# Patient Record
Sex: Female | Born: 1935 | Race: White | Hispanic: No | Marital: Married | Smoking: Never smoker
Health system: Southern US, Community
[De-identification: ages and names within clinical notes are randomized; demographics above are authoritative.]

## PROBLEM LIST (undated history)

## (undated) DIAGNOSIS — A4902 Methicillin resistant Staphylococcus aureus infection, unspecified site: Secondary | ICD-10-CM

## (undated) DIAGNOSIS — I10 Essential (primary) hypertension: Secondary | ICD-10-CM

## (undated) DIAGNOSIS — I2510 Atherosclerotic heart disease of native coronary artery without angina pectoris: Secondary | ICD-10-CM

## (undated) DIAGNOSIS — E785 Hyperlipidemia, unspecified: Secondary | ICD-10-CM

## (undated) DIAGNOSIS — I219 Acute myocardial infarction, unspecified: Secondary | ICD-10-CM

## (undated) HISTORY — DX: Atherosclerotic heart disease of native coronary artery without angina pectoris: I25.10

## (undated) HISTORY — DX: Hyperlipidemia, unspecified: E78.5

## (undated) HISTORY — DX: Essential (primary) hypertension: I10

## (undated) HISTORY — PX: CORONARY ANGIOPLASTY WITH STENT PLACEMENT: SHX49

## (undated) HISTORY — DX: Methicillin resistant Staphylococcus aureus infection, unspecified site: A49.02

## (undated) HISTORY — PX: CATARACT EXTRACTION, BILATERAL: SHX1313

---

## 1999-07-23 ENCOUNTER — Encounter (INDEPENDENT_AMBULATORY_CARE_PROVIDER_SITE_OTHER): Payer: Self-pay | Admitting: Specialist

## 1999-07-23 ENCOUNTER — Other Ambulatory Visit: Admission: RE | Admit: 1999-07-23 | Discharge: 1999-07-23 | Payer: Self-pay | Admitting: Obstetrics and Gynecology

## 2000-09-18 ENCOUNTER — Encounter: Payer: Self-pay | Admitting: Internal Medicine

## 2000-09-18 ENCOUNTER — Encounter: Admission: RE | Admit: 2000-09-18 | Discharge: 2000-09-18 | Payer: Self-pay | Admitting: Internal Medicine

## 2001-09-17 ENCOUNTER — Other Ambulatory Visit: Admission: RE | Admit: 2001-09-17 | Discharge: 2001-09-17 | Payer: Self-pay | Admitting: Internal Medicine

## 2001-09-21 ENCOUNTER — Encounter: Admission: RE | Admit: 2001-09-21 | Discharge: 2001-09-21 | Payer: Self-pay | Admitting: Internal Medicine

## 2001-09-21 ENCOUNTER — Encounter: Payer: Self-pay | Admitting: Internal Medicine

## 2002-10-15 ENCOUNTER — Encounter: Admission: RE | Admit: 2002-10-15 | Discharge: 2002-10-15 | Payer: Self-pay | Admitting: Internal Medicine

## 2002-10-15 ENCOUNTER — Encounter: Payer: Self-pay | Admitting: Internal Medicine

## 2002-12-24 ENCOUNTER — Ambulatory Visit: Admission: RE | Admit: 2002-12-24 | Discharge: 2002-12-24 | Payer: Self-pay | Admitting: Gastroenterology

## 2003-10-10 ENCOUNTER — Other Ambulatory Visit: Admission: RE | Admit: 2003-10-10 | Discharge: 2003-10-10 | Payer: Self-pay | Admitting: Internal Medicine

## 2003-10-19 ENCOUNTER — Ambulatory Visit (HOSPITAL_COMMUNITY): Admission: RE | Admit: 2003-10-19 | Discharge: 2003-10-19 | Payer: Self-pay | Admitting: Internal Medicine

## 2003-11-08 ENCOUNTER — Ambulatory Visit (HOSPITAL_COMMUNITY): Admission: RE | Admit: 2003-11-08 | Discharge: 2003-11-08 | Payer: Self-pay | Admitting: Internal Medicine

## 2004-11-16 ENCOUNTER — Ambulatory Visit (HOSPITAL_COMMUNITY): Admission: RE | Admit: 2004-11-16 | Discharge: 2004-11-16 | Payer: Self-pay | Admitting: Internal Medicine

## 2006-01-06 ENCOUNTER — Ambulatory Visit (HOSPITAL_COMMUNITY): Admission: RE | Admit: 2006-01-06 | Discharge: 2006-01-06 | Payer: Self-pay | Admitting: Internal Medicine

## 2007-01-15 ENCOUNTER — Ambulatory Visit (HOSPITAL_COMMUNITY): Admission: RE | Admit: 2007-01-15 | Discharge: 2007-01-15 | Payer: Self-pay | Admitting: Internal Medicine

## 2008-02-04 ENCOUNTER — Ambulatory Visit (HOSPITAL_COMMUNITY): Admission: RE | Admit: 2008-02-04 | Discharge: 2008-02-04 | Payer: Self-pay | Admitting: Internal Medicine

## 2008-07-19 ENCOUNTER — Emergency Department (HOSPITAL_COMMUNITY): Admission: EM | Admit: 2008-07-19 | Discharge: 2008-07-19 | Payer: Self-pay | Admitting: Emergency Medicine

## 2009-03-09 ENCOUNTER — Encounter: Payer: Self-pay | Admitting: Cardiovascular Disease

## 2009-03-12 ENCOUNTER — Encounter: Payer: Self-pay | Admitting: Emergency Medicine

## 2009-03-12 ENCOUNTER — Inpatient Hospital Stay (HOSPITAL_COMMUNITY): Admission: EM | Admit: 2009-03-12 | Discharge: 2009-03-18 | Payer: Self-pay | Admitting: Cardiovascular Disease

## 2009-03-12 ENCOUNTER — Ambulatory Visit: Payer: Self-pay | Admitting: Cardiovascular Disease

## 2009-03-12 DIAGNOSIS — I2510 Atherosclerotic heart disease of native coronary artery without angina pectoris: Secondary | ICD-10-CM | POA: Insufficient documentation

## 2009-03-13 ENCOUNTER — Encounter: Payer: Self-pay | Admitting: Cardiovascular Disease

## 2009-03-15 ENCOUNTER — Ambulatory Visit: Payer: Self-pay | Admitting: Infectious Diseases

## 2009-04-03 DIAGNOSIS — E785 Hyperlipidemia, unspecified: Secondary | ICD-10-CM | POA: Insufficient documentation

## 2009-04-03 DIAGNOSIS — A4902 Methicillin resistant Staphylococcus aureus infection, unspecified site: Secondary | ICD-10-CM | POA: Insufficient documentation

## 2009-04-03 DIAGNOSIS — I10 Essential (primary) hypertension: Secondary | ICD-10-CM | POA: Insufficient documentation

## 2009-04-04 ENCOUNTER — Ambulatory Visit: Payer: Self-pay | Admitting: Cardiovascular Disease

## 2009-05-05 ENCOUNTER — Encounter: Payer: Self-pay | Admitting: Cardiovascular Disease

## 2009-05-05 ENCOUNTER — Ambulatory Visit: Payer: Self-pay | Admitting: Cardiovascular Disease

## 2009-05-05 ENCOUNTER — Ambulatory Visit: Payer: Self-pay

## 2009-05-05 DIAGNOSIS — I2119 ST elevation (STEMI) myocardial infarction involving other coronary artery of inferior wall: Secondary | ICD-10-CM | POA: Insufficient documentation

## 2009-05-10 LAB — CONVERTED CEMR LAB
ALT: 26 units/L (ref 0–35)
AST: 31 units/L (ref 0–37)
Albumin: 4 g/dL (ref 3.5–5.2)
BUN: 25 mg/dL — ABNORMAL HIGH (ref 6–23)
Chloride: 113 meq/L — ABNORMAL HIGH (ref 96–112)
Creatinine, Ser: 1.2 mg/dL (ref 0.4–1.2)
Glucose, Bld: 88 mg/dL (ref 70–99)
LDL Cholesterol: 65 mg/dL (ref 0–99)
Total Bilirubin: 0.9 mg/dL (ref 0.3–1.2)
Total Protein: 6.7 g/dL (ref 6.0–8.3)
Triglycerides: 65 mg/dL (ref 0.0–149.0)

## 2009-05-15 ENCOUNTER — Ambulatory Visit: Payer: Self-pay | Admitting: Cardiovascular Disease

## 2009-08-07 ENCOUNTER — Telehealth: Payer: Self-pay | Admitting: Cardiovascular Disease

## 2009-08-11 ENCOUNTER — Ambulatory Visit: Payer: Self-pay | Admitting: Cardiovascular Disease

## 2009-08-11 DIAGNOSIS — R42 Dizziness and giddiness: Secondary | ICD-10-CM | POA: Insufficient documentation

## 2009-09-22 ENCOUNTER — Ambulatory Visit: Payer: Self-pay | Admitting: Cardiovascular Disease

## 2009-09-28 ENCOUNTER — Encounter (INDEPENDENT_AMBULATORY_CARE_PROVIDER_SITE_OTHER): Payer: Self-pay | Admitting: *Deleted

## 2009-10-01 LAB — CONVERTED CEMR LAB
ALT: 29 U/L
AST: 39 U/L — ABNORMAL HIGH
Albumin: 4.3 g/dL
Alkaline Phosphatase: 68 U/L
Bilirubin, Direct: 0 mg/dL
Cholesterol: 145 mg/dL
HDL: 52.6 mg/dL
LDL Cholesterol: 73 mg/dL
Total Bilirubin: 1.1 mg/dL
Total CHOL/HDL Ratio: 3
Total Protein: 7.4 g/dL
Triglycerides: 97 mg/dL
VLDL: 19.4 mg/dL

## 2009-10-02 ENCOUNTER — Ambulatory Visit: Payer: Self-pay | Admitting: Cardiovascular Disease

## 2010-02-13 ENCOUNTER — Encounter: Payer: Self-pay | Admitting: Cardiovascular Disease

## 2010-04-18 ENCOUNTER — Ambulatory Visit: Payer: Self-pay | Admitting: Cardiovascular Disease

## 2010-10-23 ENCOUNTER — Ambulatory Visit: Payer: Self-pay | Admitting: Cardiovascular Disease

## 2011-01-08 NOTE — Assessment & Plan Note (Signed)
Summary: f28m   Visit Type:  6 month follow up Primary Tita Terhaar:  Dr Valentina Lucks  CC:  swelling and Occ-Sob.  History of Present Illness: This is a 77 year old woman who sustained an inferior wall myocardial infarction in 2010 and was treated with overlapping drug-eluting stents the right coronary artery.  She presents today for follow-up evaluation.   She is doing well at present without chest pain, dyspnea, or edema. She reports gait unsteadiness but denies falls. She is tolerating medical therapy without problems, but would like to discontinue any medication that can be stopped. She is not physically active.    Current Medications (verified): 1)  Plavix 75 Mg Tabs (Clopidogrel Bisulfate) .... Take One Tablet By Mouth Daily 2)  Aspirin 81 Mg Tbec (Aspirin) .... Take One Tablet By Mouth Daily 3)  Simvastatin 40 Mg Tabs (Simvastatin) .... Take One Tablet By Mouth Daily At Bedtime 4)  Vitamin D 1000 Unit  Tabs (Cholecalciferol) .... Take 1 Tablet By Mouth Once A Day When She Tinks About It 5)  Calcium Carbonate-Vitamin D 600-400 Mg-Unit  Tabs (Calcium Carbonate-Vitamin D) .... About 5 Times A Week 6)  Inderal La 60 Mg Xr24h-Cap (Propranolol Hcl) .... Take One Tablet By Mouth Daily  Allergies (verified): No Known Drug Allergies  Past History:  Past medical history reviewed for relevance to current acute and chronic problems.  Past Medical History: Reviewed history from 04/03/2009 and no changes required.  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 HEENT: normal Neck: normal carotid upstrokes without bruits, JVP normal Lungs: CTA CV: bradycardic and regular without murmur or gallop Abd: soft, NT, positive BS, no bruit, no organomegaly Ext: no clubbing, cyanosis, or edema. peripheral pulses 2+ and equal Skin: warm and dry without rash    EKG  Procedure date:  10/23/2010  Findings:      Sinus bradycardia, limb lead reversal, HR 50 bpm, otherwise within normal limits.  Impression & Recommendations:  Problem # 1:  CAD, NATIVE VESSEL (ICD-414.01) Pt is stable post MI (2010). We discussed risk/benefit of continuing long-term ASA/Plavix...since she was treated with overlapping DES in the setting of STEMI, I would favor continuing both drugs as her risk of very-late stent thrombosis is higher than that of elective PCI. She is agreeable with this. Otherwise continue risk-reduction measures as below.  Her updated medication list for this problem includes:    Plavix 75 Mg Tabs (Clopidogrel bisulfate) .Marland Kitchen... Take one tablet by mouth daily    Aspirin 81 Mg Tbec (Aspirin) .Marland Kitchen... Take one tablet by mouth daily    Inderal La 60 Mg Xr24h-cap (Propranolol hcl) .Marland Kitchen... Take one tablet by mouth daily  Orders: EKG w/ Interpretation (93000)  Problem # 2:  HYPERTENSION, UNSPECIFIED (ICD-401.9) Reasonably well-controlled.  Her updated medication list for this problem includes:  Aspirin 81 Mg Tbec (Aspirin) .Marland Kitchen... Take one tablet by mouth daily    Inderal La 60 Mg Xr24h-cap (Propranolol hcl) .Marland Kitchen... Take one tablet by mouth daily  Orders: EKG w/ Interpretation (93000)  BP today: 138/76 Prior BP: 128/68 (04/18/2010)  Labs Reviewed: K+: 4.1 (05/05/2009) Creat: : 1.2 (05/05/2009)   Chol: 145 (09/22/2009)   HDL: 52.60 (09/22/2009)   LDL: 73 (09/22/2009)   TG: 97.0 (09/22/2009)  Problem # 3:  HYPERLIPIDEMIA-MIXED (ICD-272.4) Lipids  reviewed from Dr Jone Baseman office...total chol 182, LDL 96, HDL 63, Trig 105.   Her updated medication list for this problem includes:    Simvastatin 40 Mg Tabs (Simvastatin) .Marland Kitchen... Take one tablet by mouth daily at bedtime  Patient Instructions: 1)  Your physician recommends that you continue on your current medications as directed. Please refer to the Current Medication list given to you today. 2)  Your physician wants you to follow-up in: 6 MONTHS.  You will receive a reminder letter in the mail two months in advance. If you don't receive a letter, please call our office to schedule the follow-up appointment.

## 2011-01-08 NOTE — Consult Note (Signed)
Summary: Taylor Mendoza   Imported By: Marylou Mccoy 02/16/2010 16:20:47  _____________________________________________________________________  External Attachment:    Type:   Image     Comment:   External Document

## 2011-01-08 NOTE — Assessment & Plan Note (Signed)
Summary: f39m   Visit Type:  Follow-up Primary Provider:  Dr Valentina Lucks  CC:  SOB (same).  History of Present Illness: This is a 77 year old woman who sustained an inferior wall myocardial infarction in 2010 and was treated with overlapping drug-eluting stents the right coronary artery.  She presents today for follow-up evaluation. The patient denies chest pain, dyspnea, orthopnea, PND, edema, palpitations, lightheadedness, or syncope.  She has not been very active. Reports no bleeding complaints.    Current Medications (verified): 1)  Plavix 75 Mg Tabs (Clopidogrel Bisulfate) .... Take One Tablet By Mouth Daily 2)  Aspirin 81 Mg Tbec (Aspirin) .... Take One Tablet By Mouth Daily 3)  Simvastatin 40 Mg Tabs (Simvastatin) .... Take One Tablet By Mouth Daily At Bedtime 4)  Vitamin D 1000 Unit  Tabs (Cholecalciferol) .... Take 1 Tablet By Mouth Once A Day When She Tinks About It 5)  Calcium Carbonate-Vitamin D 600-400 Mg-Unit  Tabs (Calcium Carbonate-Vitamin D) .... About 5 Times A Week 6)  Multivitamins   Tabs (Multiple Vitamin) .... Take 1 Tablet By Mouth Once A Day 7)  Vitamin B-12 500 Mcg  Tabs (Cyanocobalamin) .... Take 1 Tablet By Mouth Once A Day 8)  Inderal La 60 Mg Xr24h-Cap (Propranolol Hcl) .... Take One Tablet By Mouth Daily  Allergies (verified): No Known Drug Allergies  Past History:  Past medical history reviewed for relevance to current acute and chronic problems.  Past Medical History: Reviewed history from 04/03/2009 and no changes required.  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 HEENT: normal Neck: normal carotid upstrokes without bruits, JVP normal Lungs: CTA CV: RRR without murmur or gallop Abd: soft, NT, positive BS, no bruit, no organomegaly Ext: no clubbing, cyanosis, or edema. peripheral pulses 2+ and equal Skin: warm and dry without rash    EKG  Procedure date:  04/18/2010  Findings:      Sinus brady, otherwise normal, HR 51 bpm.  Impression & Recommendations:  Problem # 1:  CAD, NATIVE VESSEL (ICD-414.01) Stable without recurrent angina. Continue current Rx for now. Recommend long-term dual antiplatelet Rx with ASA and Plavix as she was treated with overlapping DES in setting of AMI (high-risk of late stent thrombosis).  Her updated medication list for this problem includes:    Plavix 75 Mg Tabs (Clopidogrel bisulfate) .Marland Kitchen... Take one tablet by mouth daily    Aspirin 81 Mg Tbec (Aspirin) .Marland Kitchen... Take one tablet by mouth daily    Inderal La 60 Mg Xr24h-cap (Propranolol hcl) .Marland Kitchen... Take one tablet by mouth daily  Orders: EKG w/ Interpretation (93000)  Problem # 2:  HYPERTENSION, UNSPECIFIED (ICD-401.9) Well-controlled.  Her updated medication list for this problem includes:    Aspirin 81 Mg Tbec (Aspirin) .Marland Kitchen... Take one tablet by mouth daily    Inderal  La 60 Mg Xr24h-cap (Propranolol hcl) .Marland Kitchen... Take one tablet by mouth daily  BP today: 128/68 Prior BP: 140/72 (10/02/2009)  Labs Reviewed: K+: 4.1 (05/05/2009) Creat: : 1.2 (05/05/2009)   Chol: 145 (09/22/2009)   HDL: 52.60 (09/22/2009)   LDL: 73 (09/22/2009)   TG: 97.0 (09/22/2009)  Problem # 3:  HYPERLIPIDEMIA-MIXED (ICD-272.4) Excellent lipid panel.  Her updated medication list for this problem includes:    Simvastatin 40 Mg Tabs (Simvastatin) .Marland Kitchen... Take one tablet by mouth daily at bedtime  CHOL: 145 (09/22/2009)   LDL: 73  (09/22/2009)   HDL: 52.60 (09/22/2009)   TG: 97.0 (09/22/2009)  Patient Instructions: 1)  Your physician recommends that you continue on your current medications as directed. Please refer to the Current Medication list given to you today. 2)  Your physician wants you to follow-up in: 6 MONTHS.   You will receive a reminder letter in the mail two months in advance. If you don't receive a letter, please call our office to schedule the follow-up appointment.

## 2011-03-20 LAB — CBC
HCT: 28.5 % — ABNORMAL LOW (ref 36.0–46.0)
HCT: 30 % — ABNORMAL LOW (ref 36.0–46.0)
HCT: 36 % (ref 36.0–46.0)
Hemoglobin: 10 g/dL — ABNORMAL LOW (ref 12.0–15.0)
Hemoglobin: 10.4 g/dL — ABNORMAL LOW (ref 12.0–15.0)
MCHC: 34.3 g/dL (ref 30.0–36.0)
MCHC: 34.5 g/dL (ref 30.0–36.0)
MCHC: 35.3 g/dL (ref 30.0–36.0)
MCHC: 34.7 g/dL (ref 30.0–36.0)
MCV: 88.9 fL (ref 78.0–100.0)
MCV: 89.2 fL (ref 78.0–100.0)
MCV: 88.1 fL (ref 78.0–100.0)
Platelets: 130 10*3/uL — ABNORMAL LOW (ref 150–400)
Platelets: 133 10*3/uL — ABNORMAL LOW (ref 150–400)
Platelets: 146 10*3/uL — ABNORMAL LOW (ref 150–400)
Platelets: 166 10*3/uL (ref 150–400)
Platelets: 221 10*3/uL (ref 150–400)
RBC: 3.25 MIL/uL — ABNORMAL LOW (ref 3.87–5.11)
RDW: 12.9 % (ref 11.5–15.5)
RDW: 13.1 % (ref 11.5–15.5)
WBC: 5.5 10*3/uL (ref 4.0–10.5)
WBC: 6.2 10*3/uL (ref 4.0–10.5)
WBC: 6.2 10*3/uL (ref 4.0–10.5)

## 2011-03-20 LAB — COMPREHENSIVE METABOLIC PANEL
Albumin: 3.7 g/dL (ref 3.5–5.2)
BUN: 19 mg/dL (ref 6–23)
Calcium: 9 mg/dL (ref 8.4–10.5)
Chloride: 106 mEq/L (ref 96–112)
Creatinine, Ser: 1.24 mg/dL — ABNORMAL HIGH (ref 0.4–1.2)
GFR calc Af Amer: 51 mL/min — ABNORMAL LOW (ref >60)
Total Bilirubin: 0.6 mg/dL (ref 0.3–1.2)
Total Protein: 6.2 g/dL (ref 6.0–8.3)

## 2011-03-20 LAB — CULTURE, ROUTINE-ABSCESS

## 2011-03-20 LAB — URINALYSIS, MICROSCOPIC ONLY
Bilirubin Urine: NEGATIVE
Ketones, ur: NEGATIVE mg/dL
Specific Gravity, Urine: 1.014 (ref 1.005–1.030)
Urobilinogen, UA: 0.2 mg/dL (ref 0.0–1.0)

## 2011-03-20 LAB — BASIC METABOLIC PANEL
BUN: 12 mg/dL (ref 6–23)
BUN: 18 mg/dL (ref 6–23)
BUN: 18 mg/dL (ref 6–23)
Calcium: 8.2 mg/dL — ABNORMAL LOW (ref 8.4–10.5)
Calcium: 8.6 mg/dL (ref 8.4–10.5)
Chloride: 108 mEq/L (ref 96–112)
Chloride: 113 mEq/L — ABNORMAL HIGH (ref 96–112)
Creatinine, Ser: 1.13 mg/dL (ref 0.4–1.2)
GFR calc Af Amer: 57 mL/min — ABNORMAL LOW (ref >60)
GFR calc non Af Amer: 47 mL/min — ABNORMAL LOW (ref >60)
Glucose, Bld: 86 mg/dL (ref 70–99)
Glucose, Bld: 97 mg/dL (ref 70–99)
Potassium: 3.7 mEq/L (ref 3.5–5.1)
Potassium: 4.1 mEq/L (ref 3.5–5.1)

## 2011-03-20 LAB — DIFFERENTIAL
Basophils Absolute: 0 10*3/uL (ref 0.0–0.1)
Lymphocytes Relative: 46 % (ref 12–46)
Lymphs Abs: 4 10*3/uL (ref 0.7–4.0)
Monocytes Absolute: 0.7 10*3/uL (ref 0.1–1.0)
Neutro Abs: 3.6 10*3/uL (ref 1.7–7.7)

## 2011-03-20 LAB — CULTURE, BLOOD (ROUTINE X 2)

## 2011-03-20 LAB — POCT CARDIAC MARKERS: Myoglobin, poc: 127 ng/mL (ref 12–200)

## 2011-03-20 LAB — URINE CULTURE: Colony Count: 85000

## 2011-03-20 LAB — WOUND CULTURE

## 2011-03-20 LAB — LIPID PANEL
Cholesterol: 154 mg/dL (ref 0–200)
HDL: 45 mg/dL (ref >39)
LDL Cholesterol: 97 mg/dL (ref 0–99)
Triglycerides: 60 mg/dL (ref <150)

## 2011-03-20 LAB — APTT: aPTT: 25 seconds (ref 24–37)

## 2011-04-23 NOTE — Op Note (Signed)
NAMESTEPHENIA, Mendoza NO.:  [id number]   MEDICAL RECORD NO.:  [id number]          PATIENT TYPE:  INP   LOCATION:  2036                         FACILITY:  [facility]   PHYSICIAN:  Anselm Pancoast. Weatherly, M.D.DATE OF BIRTH:  07-10-1930   DATE OF PROCEDURE:  DATE OF DISCHARGE:                               OPERATIVE REPORT   She has got history of recent MRSA skin infections, status post 5 days  of myocardial infarction and we were asked to see her today because of  an abscess on her buttocks that she said she was planning to be seen by  a physician on Monday.   HISTORY:  Taylor Mendoza is a 77 year old female who was admitted on  Sunday with chest pain by Dr. Tonny Bollman.  She lives in [place]  and she had been out of town for the weekend, started having chest pain  as she was returning and proceeded on but was brought to the emergency  room.  She has got a past history of hypertension and on EKGs was shown  to have symptoms of acute myocardial infarction.  She was taken to the  Cardiac Cath Unit and managed.  She did satisfactorily and the following  day the buttock that she had noted swelling was not brought to anyone's  attention.  Yesterday or so, it became more swollen and tender.  She was  seen by Infectious Disease and they placed her on vancomycin  intravenously since she has had previous MRSA infections and has been on  week-end course of doxycycline.  This morning when we were making  rounds, Dr. Excell Seltzer has made to see her, I have seen the patient, in the  next room, she has got an obvious area around the buttocks probably 3  inches or so from the anus on the right cheek that is marked swollen  probably about a 3-inch area with underlying fluctuance and then there  is a smaller area, kind of more lateral that she said that one of the  skin wound doctors pinched the area that it obviously had a small pocket  of infection to this third lesion that is just a  little blackhead or  whitehead.  I think we can drain these here in the patient's room.  She  is on Plavix.  She is not on continuous heparin.  We got a suturing tray  and some Xylocaine and used a Betadine prep.  I then anesthetized the  surrounding skin on all three of these areas and then made a small  triangulation of skin removed, opened over into the pocket in the medium  size area and then made a little larger opening over the large abscess  and did open into a significant pocket of frank dishwater-type  infection, and we used an aerobic culture tube to open it.  I then used  hemostat to open up the little satellite pockets that had developed from  this area and then used hydrogen peroxide to kind of wash the areas.  There was more bleeding from the skin edges than usual, but I expect  this is all related to the Plavix and then I used a couple of 3-0  chromic sutures in the subcutaneous tissue for hemostasis.  The two  larger areas were then packed with 2x2's soaked in Betadine and a little  pressure dressing over it.  The little area that was just a small  blackhead or whitehead just bleeds significantly.  The patient tolerated  the procedure without any significant discomfort.  We will recheck her  later this afternoon to make sure that the bleeding is controlled with  little pressure dressing and then she will start soaking in the bath tub  tomorrow, and she hopes to get discharged tomorrow.  She definitely  needs to be on kind of a prolonged period of antibiotics appropriately,  probably doxycycline as I think she is allergic to [facility], and we will  await the results of the cultures.  These were probably MRSA infections,  as she has had several over the last several months.  She has got other  little areas of skin abrasions on the buttocks that are not actually  frank abscesses at this time, but I expect all of these will have the  same organism.     Anselm Pancoast. Zachery Dakins,  M.D.  Electronically Signed    WJW/MEDQ  D:  03/17/2009  T:  03/18/2009  Job:  [id number]

## 2011-04-23 NOTE — H&P (Signed)
Taylor Mendoza, Taylor Mendoza               ACCOUNT NO.:  [id number]   MEDICAL RECORD NO.:  [id number]          PATIENT TYPE:  INP   LOCATION:  2910                         FACILITY:  [facility]   PHYSICIAN:  Veverly Fells. Excell Seltzer, MD  DATE OF BIRTH:  08/26/1930   DATE OF ADMISSION:  03/12/2009  DATE OF DISCHARGE:                              HISTORY & PHYSICAL   CHIEF COMPLAINT:  Chest pain.   HISTORY OF PRESENT ILLNESS:  Taylor Mendoza is a delightful 77 year old  woman who was driving home from [place] dinner with her husband when she  developed substernal chest pressure radiating to both arms.  She became  diaphoretic and had a single episode of nausea and vomiting.  They were  driving back from [place], [place].  They went directly to the  [facility] emergency department where she was diagnosed promptly with  an acute inferoposterior myocardial infarction.  The patient had ongoing  chest pain and was transferred emergently to the [facility] cardiac cath  lab.   On arrival, the patient continues to have chest pain.  She rates her  pain at a 5/10.  It does not radiate into the back.  She complains of  mild dyspnea.  She has no other associated symptoms at present.   PAST MEDICAL HISTORY:  Pertinent only for hypertension, bilateral  cataract surgery, and remote tonsillectomy.  She reports some boils on  the buttock area.   SOCIAL HISTORY:  The patient is married.  She lives in [place] with  her husband.  She has 3 grown children, 2 live locally and another lives  in [place].  She does not smoke cigarettes or drink alcohol.   FAMILY HISTORY:  Negative for premature coronary artery disease.   REVIEW OF SYSTEMS:  A complete 12-point review of systems was performed.  Pertinent positives included nausea and vomiting with her episode today,  as well as diaphoresis.  She describes boils in the sacral area as  described above with some drainage by her report.  All other systems  were negative  except as outlined in the HPI.   MEDICATIONS:  Diovan, question dose.   ALLERGIES:  NKDA.   PHYSICAL EXAM:  Patient is alert and oriented.  She is in moderate  distress.  Blood pressure 109/54, heart rate 62, respiratory rate 20, oxygen  saturation 98%.  HEENT:  Normal.  NECK:  Normal carotid upstrokes.  No bruits.  JVP normal.  No  thyromegaly or thyroid nodules.  LUNGS:  Clear to auscultation bilaterally.  HEART:  The apex is discrete and nondisplaced.  No right ventricular  heave or lift.  Heart is regular rate and rhythm.  No murmurs or  gallops.  ABDOMEN:  Soft, nontender, no organomegaly.  No abdominal bruits.  BACK:  No CVA tenderness.  EXTREMITIES:  No clubbing, cyanosis or edema.  Peripheral pulses 2+ and  equal throughout.  SKIN:  Warm and dry without rash.  NEUROLOGIC:  Cranial nerves II through XII are intact.  Strength is  intact and equal.   EKG shows normal sinus rhythm with acute inferoposterior injury.  LABS:  Currently pending.   ASSESSMENT:  This is a 77 year old woman with a history of hypertension,  presenting with an acute myocardial infarction, as outlined above.   PLAN:  For emergency cardiac catheterization.  The patient has been  appropriately pretreated with 600 mg Plavix, intravenous heparin bolus  followed by drip, and morphine for pain control.  Risks and indications  for the catheterization procedure and PCI were reviewed in detail with  the patient, who verbally agrees to proceed.  Further plans pending the  results of her cardiac catheterization.      Veverly Fells. Excell Seltzer, MD  Electronically Signed     MDC/MEDQ  D:  03/12/2009  T:  03/12/2009  Job:  [id number]   cc:   Thora Lance, M.D.

## 2011-04-23 NOTE — Discharge Summary (Signed)
Taylor Mendoza, Taylor Mendoza NO.:  [id number]   MEDICAL RECORD NO.:  [id number]          PATIENT TYPE:  INP   LOCATION:  2036                         FACILITY:  [facility]   PHYSICIAN:  Taylor Rotunda, MD, FACCDATE OF BIRTH:  1930-11-25   DATE OF ADMISSION:  03/12/2009  DATE OF DISCHARGE:  03/18/2009                               DISCHARGE SUMMARY   PRIMARY CARDIOLOGIST:  Veverly Fells. Excell Seltzer, MD   CONSULTING GENERAL SURGEON:  Anselm Pancoast. Zachery Dakins, MD   DISCHARGE DIAGNOSIS:  Acute inferoposterior myocardial infarction.   SECONDARY DIAGNOSES:  1. Coronary artery disease status post successful percutaneous      coronary intervention and stenting of the right coronary artery      with placement of a Xience V 3.5 x 18 mm drug-eluting stent and      Xience V 3.0 x 8 mm drug-eluting stent this admission.  2. Hypertension.  3. Hyperlipidemia.  4. MRSA (methicillin resistant Staphylococcus aureus) furuncle status      post incision and drainage by Dr. Zachery Dakins.   ALLERGIES:  No known drug allergies.   PROCEDURES:  Left heart cardiac catheterization with successful PCI and  stenting to RCA with placement of two Xience V drug-eluting stents.  A 2-  D echocardiogram performed March 13, 2009, showing an EF of 50% with mid  inferior mid posterior wall, moderate hypokinesis.  Significant elevated  RV filling pressures with moderate pulmonary hypertension suggestive of  RV infarct.  Incidental drainage of MRSA furuncle of the buttocks.   HISTORY OF PRESENT ILLNESS:  A 77 year old female who was in her usual  state of health until March 12, 2009, when while driving home with her  husband developed substernal chest discomfort and presented to the  [facility] ER.  There she was noted to have inferior posterior ST  segmental elevation and she was promptly transferred to [facility] for  code STEMI and catheterization.   HOSPITAL COURSE:  The patient underwent emergent cardiac  catheterization  revealing an occluded mid right coronary artery.  Otherwise she had  nonobstructive disease with mild to moderate segmental LV dysfunction.  The RCA was successfully stented with two Xience drug-eluting stents.  The patient was monitored in the coronary intensive care unit following  PCI and had no recurrent chest pain.  She was maintained on aspirin,  Plavix and statin therapy and beta-blocker was also initiated.  On April  5 the patient was noted to have fever.  She does have a history of MRSA  and abscesses on her buttocks.  For this reason she was placed on  vancomycin.  Urinalysis and culture were also obtained as well as wound  culture.  Blood cultures were negative.  Urine culture showed multiple  organisms while wound culture grew out methicillin resistant  Staphylococcus aureus.  Infectious disease was consulted who recommended  switching her to Bactrim DS therapy for 28 days as well as mupirocin  intranasal ointment 2% to be used b.i.d. for 5 days for the first 5 days  of each month for the next 3 months.  We then asked general  surgery to  evaluate the patient for I and D and this was performed on April 9.  Abscess culture also grew out gram positive cocci in clusters.   From a cardiac standpoint [name] has done well and will be  discharged home today in good condition.  She will remain on antibiotics  as described and will follow up with Dr. Zachery Dakins with general surgery  on Monday April 12 at 10 a.m.   DISCHARGE LABS:  Hemoglobin 10.4, hematocrit 30.0, WBC 6.2, platelets  166.  Sodium 138, potassium 4.1, chloride 108, CO2 25, BUN 18,  creatinine 1.23, glucose 86, calcium 8.4.  BNP 436, total cholesterol  174, triglycerides 60, HDL 45, LDL 97.  Urinalysis was abnormal with  large leukocytes, many bacteria.  Urine culture was negative.  Blood  cultures were negative.  Wound culture positive for MRSA   DISPOSITION:  The patient will be discharged home  today in good  condition.   FOLLOWUP AND APPOINTMENTS:  We will arrange a followup with Dr. Excell Seltzer  in approximately 2 weeks.  She will follow up with Dr. Zachery Dakins on  Monday, April 12 at 10 a.m.   DISCHARGE MEDICATIONS:  1. Septra DS b.i.d. times 28 days.  2. Betadine solution with 4x4 to wound site.  3. Mupirocin topical 2% to be applied intranasally b.i.d. for the      first 5 days of the next 3 months starting now.  4. Aspirin 325 mg daily.  5. Plavix 75 mg daily.  6. Simvastatin 40 mg q.h.s.  7. Coreg 3.125 mg b.i.d.  8. Nitroglycerin 0.4 mg sublingual p.r.n. chest pain.   OUTSTANDING LAB STUDIES:  None.   DURATION DISCHARGE ENCOUNTER:  60 minutes including physician time.      Nicolasa Ducking, ANP      Taylor Rotunda, MD, [facility]  Electronically Signed    CB/MEDQ  D:  03/18/2009  T:  03/18/2009  Job:  [id number]   cc:   Anselm Pancoast. Zachery Dakins, M.D.

## 2011-04-24 ENCOUNTER — Other Ambulatory Visit: Payer: Self-pay | Admitting: Cardiovascular Disease

## 2011-04-26 NOTE — Op Note (Signed)
   NAMECHRISTAL, Taylor Mendoza                         ACCOUNT NO.:  [id number]   MEDICAL RECORD NO.:  [id number]                   PATIENT TYPE:  AMB   LOCATION:  [facility]                                 FACILITY:  [facility]   PHYSICIAN:  Danise Edge, M.D.                DATE OF BIRTH:  Mar 25, 1930   DATE OF PROCEDURE:  12/24/2002  DATE OF DISCHARGE:                                 OPERATIVE REPORT   PROCEDURE:  Screening colonoscopy.   INDICATIONS:  The patient is a 76 year old female born 2030/06/29.  The patient  is scheduled to undergo her first screening colonoscopy with polypectomy to  prevent colon cancer.  I discussed with her the complications associated  with colonoscopy and polypectomy, including a 15 per thousand risk of  bleeding and one per thousand risk of colon perforation requiring surgical  repair.  The patient has signed the operative permit.   ENDOSCOPIST:  Danise Edge, M.D.   PREMEDICATION:  Versed 5 mg, Demerol 50 mg.   ENDOSCOPE:  Olympus pediatric colonoscope.   DESCRIPTION OF PROCEDURE:  After obtaining informed consent, the patient was  placed in the left lateral decubitus position.  I administered intravenous  Demerol and intravenous Versed to achieve conscious sedation for the  procedure.  The patient's blood pressure, oxygen saturation, and cardiac  rhythm were monitored throughout the procedure and documented in the medical  record.   Anal inspection was normal.  Digital rectal exam was normal.  The Olympus  pediatric video colonoscope was introduced into the rectum and advanced to  the cecum.  Colonic preparation for the exam today was excellent.   Rectum normal.   Sigmoid colon and descending colon:  Left colonic diverticulosis.   Splenic flexure normal.   Transverse colon normal.   Hepatic flexure normal.   Ascending colon normal.   Cecum and ileocecal valve normal.    ASSESSMENT:  Normal screening proctocolonoscopy to the cecum except for  the  presence of left colonic diverticulosis.  No endoscopic evidence for the  presence of colorectal neoplasia.                                               Danise Edge, M.D.    MJ/MEDQ  D:  12/24/2002  T:  12/25/2002  Job:  [id number]   cc:   Thora Lance, M.D.  [address]  [place]  [place] [address]  Fax: [phone]

## 2011-05-02 ENCOUNTER — Encounter: Payer: Self-pay | Admitting: *Deleted

## 2011-05-07 ENCOUNTER — Ambulatory Visit (INDEPENDENT_AMBULATORY_CARE_PROVIDER_SITE_OTHER): Payer: Medicare Other | Admitting: Cardiovascular Disease

## 2011-05-07 ENCOUNTER — Other Ambulatory Visit: Payer: Self-pay | Admitting: Cardiovascular Disease

## 2011-05-07 ENCOUNTER — Encounter: Payer: Self-pay | Admitting: Cardiovascular Disease

## 2011-05-07 VITALS — BP 122/80 | HR 54 | Resp 18 | Ht 65.0 in | Wt 145.1 lb

## 2011-05-07 DIAGNOSIS — I10 Essential (primary) hypertension: Secondary | ICD-10-CM

## 2011-05-07 DIAGNOSIS — E785 Hyperlipidemia, unspecified: Secondary | ICD-10-CM

## 2011-05-07 DIAGNOSIS — I2510 Atherosclerotic heart disease of native coronary artery without angina pectoris: Secondary | ICD-10-CM

## 2011-05-07 NOTE — Patient Instructions (Addendum)
Your physician has recommended you make the following change in your medication: STOP Plavix, Do not take Simvastatin for 2 WEEKS--Please call the office to let us know how you are feeling.  Your physician wants you to follow-up in: 6 MONTHS.  You will receive a reminder letter in the mail two months in advance. If you don't receive a letter, please call our office to schedule the follow-up appointment.

## 2011-05-21 ENCOUNTER — Encounter: Payer: Self-pay | Admitting: Cardiovascular Disease

## 2011-05-21 NOTE — Assessment & Plan Note (Signed)
Followed by her primary care physician, Dr. Valentina Lucks. Note simvastatin on hold secondary to myalgias.

## 2011-05-21 NOTE — Progress Notes (Signed)
HPI:  This is a 77 year old woman with coronary artery disease presented for followup evaluation. She had an inferior wall MI in 2010 and was treated with overlapping drug-eluting stents to the right coronary artery. She has had no recurrent ischemic events since that time.  Her main problems relate to leg pain and weakness as well as gait unsteadiness. She denies chest pain or dyspnea. She denies lightheadedness, palpitations, or syncope. She has been compliant with her medical program. She would like to discontinue Plavix. Her simvastatin is on hold because of muscle weakness.  Outpatient Encounter Prescriptions as of 05/07/2011  Medication Sig Dispense Refill  . aspirin 81 MG tablet Take 81 mg by mouth daily.        . Calcium Carbonate-Vitamin D (TH CALCIUM CARBONATE-VITAMIN D) 600-400 MG-UNIT per tablet Take 1 tablet by mouth daily.        . cholecalciferol (VITAMIN D) 1000 UNITS tablet Take 1,000 Units by mouth daily.        . polyethylene glycol (MIRALAX / GLYCOLAX) packet Take 17 g by mouth as needed.        . propranolol (INDERAL LA) 60 MG 24 hr capsule take 1 capsule by mouth once daily  30 capsule  5  . DISCONTD: clopidogrel (PLAVIX) 75 MG tablet Take 75 mg by mouth daily.        . simvastatin (ZOCOR) 40 MG tablet Take 1 tablet (40 mg total) by mouth at bedtime. **ON HOLD**      . DISCONTD: propranolol (INDERAL LA) 60 MG 24 hr capsule Take 60 mg by mouth daily.        Marland Kitchen DISCONTD: simvastatin (ZOCOR) 40 MG tablet TAKE 1 TABLET BY MOUTH AT BEDTIME  30 tablet  4    No Known Allergies  Past Medical History  Diagnosis Date  . Coronary artery disease   . Unspecified essential hypertension   . Other and unspecified hyperlipidemia   . Methicillin resistant Staphylococcus aureus in conditions classified elsewhere and of unspecified site     ROS: Negative except as per HPI  BP 122/80  Pulse 54  Resp 18  Ht 5\' 5"  (1.651 m)  Wt 145 lb 1.9 oz (65.826 kg)  BMI 24.15 kg/m2  PHYSICAL  EXAM: Pt is alert and oriented, NAD HEENT: normal Neck: JVP - normal, carotids 2+= without bruits Lungs: CTA bilaterally CV: bradycardic and regular without murmur or gallop Abd: soft, NT, Positive BS, no hepatomegaly Ext: no C/C/E, distal pulses intact and equal Skin: warm/dry no rash  EKG:  Sinus bradycardia 54 beats per minute, within normal limits  ASSESSMENT AND PLAN:

## 2011-05-21 NOTE — Assessment & Plan Note (Signed)
Blood pressure well controlled. She takes Inderal.

## 2011-05-21 NOTE — Assessment & Plan Note (Signed)
The patient is stable without angina. She is 2 years out for myocardial infarction. We reviewed the indications of ongoing dual antiplatelet therapy versus the risk of such. Considering her strong desire to quit taking Plavix as well as the [facility] guideline recommending 12 months of therapy, I agree that she can stop taking this medication. He discussed the risk of very late stent thrombosis but this risk is very low now that she is out 2 years and she was treated with second-generation drug-eluting stent.

## 2011-09-06 LAB — CULTURE, ROUTINE-ABSCESS

## 2011-11-11 ENCOUNTER — Other Ambulatory Visit (HOSPITAL_COMMUNITY): Payer: Self-pay | Admitting: *Deleted

## 2011-11-13 ENCOUNTER — Other Ambulatory Visit: Payer: Self-pay | Admitting: Cardiovascular Disease

## 2011-11-13 NOTE — Telephone Encounter (Signed)
Need to make an appointment in the future

## 2011-11-14 ENCOUNTER — Ambulatory Visit (HOSPITAL_COMMUNITY)
Admission: RE | Admit: 2011-11-14 | Discharge: 2011-11-14 | Disposition: A | Discharge status: Home / Self Care | Payer: Medicare Other | Source: Ambulatory Visit | Attending: Internal Medicine | Admitting: Internal Medicine

## 2011-11-14 DIAGNOSIS — M810 Age-related osteoporosis without current pathological fracture: Secondary | ICD-10-CM | POA: Insufficient documentation

## 2011-11-14 MED ORDER — ZOLEDRONIC ACID 5 MG/100ML IV SOLN
5.0000 mg | Freq: Once | INTRAVENOUS | Status: AC
  Administered 2011-11-14: 5 mg via INTRAVENOUS
  Filled 2011-11-14: qty 100

## 2012-03-30 ENCOUNTER — Other Ambulatory Visit: Payer: Self-pay | Admitting: Cardiovascular Disease

## 2012-05-12 ENCOUNTER — Encounter: Payer: Self-pay | Admitting: Cardiovascular Disease

## 2012-05-12 ENCOUNTER — Ambulatory Visit (INDEPENDENT_AMBULATORY_CARE_PROVIDER_SITE_OTHER): Payer: Medicare Other | Admitting: Cardiovascular Disease

## 2012-05-12 VITALS — BP 112/70 | HR 57 | Ht 66.0 in | Wt 141.0 lb

## 2012-05-12 DIAGNOSIS — I2510 Atherosclerotic heart disease of native coronary artery without angina pectoris: Secondary | ICD-10-CM

## 2012-05-12 DIAGNOSIS — R0602 Shortness of breath: Secondary | ICD-10-CM

## 2012-05-12 NOTE — Patient Instructions (Signed)
Your physician has requested that you have a stress echocardiogram. For further information please visit [email or web address]. Please follow instruction sheet as given.  Your physician wants you to follow-up in: 1 YEAR with Dr Cooper. You will receive a reminder letter in the mail two months in advance. If you don't receive a letter, please call our office to schedule the follow-up appointment.  Your physician recommends that you continue on your current medications as directed. Please refer to the Current Medication list given to you today.  

## 2012-05-12 NOTE — Progress Notes (Signed)
   HPI:  78 year old woman presenting for followup evaluation. The patient has coronary artery disease and presented with an inferior wall MI in 2010. She was treated with overlapping drug-eluting stents the right coronary artery and has had preserved LV function. Her Plavix was stopped one year ago.  The patient complains of dyspnea with exertion. She reports dyspnea with low-level activity such as walking at a normal pace on flat ground. She denies orthopnea, PND or edema. She's had no chest pain or pressure. Symptoms of dyspnea are progressive over the past 6-12 months. She has no palpitations, lightheadedness, or syncope.  Outpatient Encounter Prescriptions as of 05/12/2012  Medication Sig Dispense Refill  . aspirin 81 MG tablet Take 81 mg by mouth daily.        Marland Kitchen atorvastatin (LIPITOR) 10 MG tablet Take 1 tablet by mouth daily.      . Calcium Carbonate-Vitamin D (TH CALCIUM CARBONATE-VITAMIN D) 600-400 MG-UNIT per tablet Take 1 tablet by mouth daily.        . cholecalciferol (VITAMIN D) 1000 UNITS tablet Take 1,000 Units by mouth daily.        . pantoprazole (PROTONIX) 40 MG tablet Take 1 tablet by mouth daily.      . polyethylene glycol (MIRALAX / GLYCOLAX) packet Take 17 g by mouth as needed.        . propranolol ER (INDERAL LA) 60 MG 24 hr capsule take 1 capsule by mouth once daily  30 capsule  3  . DISCONTD: simvastatin (ZOCOR) 40 MG tablet Take 1 tablet (40 mg total) by mouth at bedtime. **ON HOLD**        No Known Allergies  Past Medical History  Diagnosis Date  . Coronary artery disease   . Unspecified essential hypertension   . Other and unspecified hyperlipidemia   . Methicillin resistant Staphylococcus aureus in conditions classified elsewhere and of unspecified site     ROS: Negative except as per HPI  BP 112/70  Pulse 57  Ht 5\' 6"  (1.676 m)  Wt 63.957 kg (141 lb)  BMI 22.76 kg/m2  PHYSICAL EXAM: Pt is alert and oriented, elderly woman in NAD HEENT: normal Neck:  JVP - normal, carotids 2+= without bruits Lungs: CTA bilaterally CV: RRR without murmur or gallop Abd: soft, NT, Positive BS, no hepatomegaly Ext: no C/C/E, distal pulses intact and equal Skin: warm/dry no rash  EKG:  Sinus bradycardia 57 beats per minute, rightward axis, otherwise within normal limits  ASSESSMENT AND PLAN: 1. Coronary artery disease status post MI. The patient's last echocardiogram in 2010 showed normal left ventricular function. She did have mitral regurgitation related to prolapse. I think she should undergo a stress echocardiogram to rule out ischemic changes. I do not appreciate significant mitral regurgitation on her exam. She will continue on her current medical program. If her stress echo is normal, I would suspect her symptoms are related to deconditioning.  2. Hyperlipidemia. Patient is followed by Dr. Valentina Lucks and is maintained on low-dose atorvastatin.  Tonny Bollman 05/12/2012 10:45 AM

## 2012-05-29 ENCOUNTER — Ambulatory Visit (HOSPITAL_COMMUNITY): Payer: Medicare Other | Attending: Cardiovascular Disease

## 2012-05-29 ENCOUNTER — Encounter: Payer: Self-pay | Admitting: Cardiovascular Disease

## 2012-05-29 DIAGNOSIS — I2510 Atherosclerotic heart disease of native coronary artery without angina pectoris: Secondary | ICD-10-CM

## 2012-05-29 DIAGNOSIS — R0989 Other specified symptoms and signs involving the circulatory and respiratory systems: Secondary | ICD-10-CM | POA: Insufficient documentation

## 2012-05-29 DIAGNOSIS — I10 Essential (primary) hypertension: Secondary | ICD-10-CM | POA: Insufficient documentation

## 2012-05-29 DIAGNOSIS — R0609 Other forms of dyspnea: Secondary | ICD-10-CM | POA: Insufficient documentation

## 2012-05-29 DIAGNOSIS — R0602 Shortness of breath: Secondary | ICD-10-CM

## 2012-05-29 DIAGNOSIS — E785 Hyperlipidemia, unspecified: Secondary | ICD-10-CM | POA: Insufficient documentation

## 2012-05-29 NOTE — Progress Notes (Signed)
Echocardiogram performed.  

## 2012-06-08 ENCOUNTER — Other Ambulatory Visit: Payer: Self-pay

## 2012-06-08 DIAGNOSIS — R9430 Abnormal result of cardiovascular function study, unspecified: Secondary | ICD-10-CM

## 2012-06-08 DIAGNOSIS — R0602 Shortness of breath: Secondary | ICD-10-CM

## 2012-06-18 ENCOUNTER — Ambulatory Visit (HOSPITAL_COMMUNITY): Payer: Medicare Other | Attending: Internal Medicine | Admitting: Radiology

## 2012-06-18 VITALS — BP 123/83 | Ht 66.0 in | Wt 142.0 lb

## 2012-06-18 DIAGNOSIS — R0609 Other forms of dyspnea: Secondary | ICD-10-CM | POA: Insufficient documentation

## 2012-06-18 DIAGNOSIS — E785 Hyperlipidemia, unspecified: Secondary | ICD-10-CM | POA: Insufficient documentation

## 2012-06-18 DIAGNOSIS — I252 Old myocardial infarction: Secondary | ICD-10-CM | POA: Insufficient documentation

## 2012-06-18 DIAGNOSIS — I10 Essential (primary) hypertension: Secondary | ICD-10-CM | POA: Insufficient documentation

## 2012-06-18 DIAGNOSIS — R0602 Shortness of breath: Secondary | ICD-10-CM

## 2012-06-18 DIAGNOSIS — R0989 Other specified symptoms and signs involving the circulatory and respiratory systems: Secondary | ICD-10-CM | POA: Insufficient documentation

## 2012-06-18 DIAGNOSIS — R5381 Other malaise: Secondary | ICD-10-CM | POA: Insufficient documentation

## 2012-06-18 DIAGNOSIS — I2510 Atherosclerotic heart disease of native coronary artery without angina pectoris: Secondary | ICD-10-CM

## 2012-06-18 DIAGNOSIS — R9430 Abnormal result of cardiovascular function study, unspecified: Secondary | ICD-10-CM

## 2012-06-18 DIAGNOSIS — R42 Dizziness and giddiness: Secondary | ICD-10-CM | POA: Insufficient documentation

## 2012-06-18 MED ORDER — TECHNETIUM TC 99M TETROFOSMIN IV KIT
11.0000 | PACK | Freq: Once | INTRAVENOUS | Status: AC | PRN
  Administered 2012-06-18: 11 via INTRAVENOUS

## 2012-06-18 MED ORDER — REGADENOSON 0.4 MG/5ML IV SOLN
0.4000 mg | Freq: Once | INTRAVENOUS | Status: AC
  Administered 2012-06-18: 0.4 mg via INTRAVENOUS

## 2012-06-18 MED ORDER — TECHNETIUM TC 99M TETROFOSMIN IV KIT
33.0000 | PACK | Freq: Once | INTRAVENOUS | Status: AC | PRN
  Administered 2012-06-18: 33 via INTRAVENOUS

## 2012-06-18 NOTE — Progress Notes (Signed)
[facility] SITE 3 NUCLEAR MED [address] [place] [address] [phone]  Cardiology Nuclear Med Study  Taylor Mendoza is a 78 y.o. female     MRN : [id number]     DOB: 1930/11/04  Procedure Date: 06/18/2012  Nuclear Med Background Indication for Stress Test:  Evaluation for Ischemia and Stent Patency History:  6/13 Stress Echo-indeterminate results,  2010-MI, Stent-RCA Cardiac Risk Factors: Hypertension and Lipids  Symptoms:  Dizziness, DOE, Fatigue and SOB   Nuclear Pre-Procedure Caffeine/Decaff Intake:  None NPO After: 7:00pm   Lungs:  clear O2 Sat: 96% on room air. IV 0.9% NS with Angio Cath:  20g  IV Site: R Antecubital  IV Started by:  Stanton Kidney, EMT-P  Chest Size (in):  36 Cup Size: C  Height: 5\' 6"  (1.676 m)  Weight:  142 lb (64.411 kg)  BMI:  Body mass index is 22.92 kg/(m^2). Tech Comments:  Propranolol held as directed, per patient.    Nuclear Med Study 1 or 2 day study: 1 day  Stress Test Type:  Eugenie Birks  Reading MD: Dietrich Pates, MD  Order Authorizing Provider:  Tonny Bollman, MD  Resting Radionuclide: Technetium 10m Tetrofosmin  Resting Radionuclide Dose: 11.0 mCi   Stress Radionuclide:  Technetium 35m Tetrofosmin  Stress Radionuclide Dose: 33.0 mCi           Stress Protocol Rest HR: 47 Stress HR: 93  Rest BP: 111/85 Stress BP: 172/77  Exercise Time (min): 2:00 METS: 1.6   Predicted Max HR: 141 bpm % Max HR: 65.96 bpm Rate Pressure Product: 01027   Dose of Adenosine (mg):  n/a Dose of Lexiscan: 0.4 mg  Dose of Atropine (mg): n/a Dose of Dobutamine: n/a mcg/kg/min (at max HR)  Stress Test Technologist: Bonnita Levan, RN  Nuclear Technologist:  Domenic Polite, CNMT     Rest Procedure:  Myocardial perfusion imaging was performed at rest 45 minutes following the intravenous administration of Technetium 32m Tetrofosmin. Rest ECG: Sinus Bradycardia  Stress Procedure:  The patient received IV Lexiscan 0.4 mg over 15-seconds with  concurrent low level exercise and then Technetium 40m Tetrofosmin was injected at 30-seconds while the patient continued walking one more minute. There were no significant changes with Lexiscan. Quantitative spect images were obtained after a 45-minute delay. Stress ECG: No significant change from baseline ECG  QPS Raw Data Images:  Images were motion corrected.  Soft tissue (breast, diaphragm, bowel activity) surround heart. Stress Images:  Normal homogeneous uptake in all areas of the myocardium. Rest Images:  Normal homogeneous uptake in all areas of the myocardium. Subtraction (SDS):  No evidence of ischemia. Transient Ischemic Dilatation (Normal <1.22):  0.97 Lung/Heart Ratio (Normal <0.45):  0.25  Quantitative Gated Spect Images QGS EDV:  74 ml QGS ESV:  23 ml  Impression Exercise Capacity:  Lexiscan with low level exercise. BP Response:  Normal blood pressure response. Clinical Symptoms:  No chest pain. ECG Impression:  No significant ST segment change suggestive of ischemia. Comparison with Prior Nuclear Study: \No prior study.  Overall Impression:  Normal stress nuclear study.  LV Ejection Fraction: 68%.  LV Wall Motion:  NL LV Function; NL Wall Motion Dietrich Pates

## 2012-08-04 ENCOUNTER — Telehealth: Payer: Self-pay | Admitting: Cardiovascular Disease

## 2012-08-04 NOTE — Telephone Encounter (Signed)
Pt's pharmacy told her propanalol is no longer a good medicine to take, needs to change med, pls call pt [phone], uses [facility]

## 2012-08-04 NOTE — Telephone Encounter (Signed)
I spoke with the pt and she states that [facility] would not fill her medication because it is not an appropriate medication.  I contacted [facility] and spoke with Almira Coaster the pharmacist and she said that an electronic Rx was sent from our office yesterday stating that the refill is not appropriate. I looked at pt's medication list and the propranolol has not been updated or prescribed since 03/30/12. The pt does need to remain on this medication at this time.  I did authorize 6 refills on this medication. Pt aware.

## 2013-04-17 ENCOUNTER — Other Ambulatory Visit: Payer: Self-pay | Admitting: Cardiovascular Disease

## 2013-04-22 ENCOUNTER — Encounter (HOSPITAL_COMMUNITY): Payer: Medicare Other

## 2013-05-05 ENCOUNTER — Other Ambulatory Visit (HOSPITAL_COMMUNITY): Payer: Self-pay | Admitting: *Deleted

## 2013-05-06 ENCOUNTER — Ambulatory Visit (HOSPITAL_COMMUNITY)
Admission: RE | Admit: 2013-05-06 | Discharge: 2013-05-06 | Disposition: A | Discharge status: Home / Self Care | Payer: Medicare Other | Source: Ambulatory Visit | Attending: Internal Medicine | Admitting: Internal Medicine

## 2013-05-06 DIAGNOSIS — M810 Age-related osteoporosis without current pathological fracture: Secondary | ICD-10-CM | POA: Insufficient documentation

## 2013-05-06 MED ORDER — ZOLEDRONIC ACID 5 MG/100ML IV SOLN
5.0000 mg | Freq: Once | INTRAVENOUS | Status: AC
  Administered 2013-05-06: 5 mg via INTRAVENOUS

## 2013-05-06 MED ORDER — ZOLEDRONIC ACID 5 MG/100ML IV SOLN
INTRAVENOUS | Status: AC
  Administered 2013-05-06: 5 mg via INTRAVENOUS
  Filled 2013-05-06: qty 100

## 2013-05-12 ENCOUNTER — Encounter: Payer: Self-pay | Admitting: Cardiovascular Disease

## 2013-05-12 ENCOUNTER — Ambulatory Visit (INDEPENDENT_AMBULATORY_CARE_PROVIDER_SITE_OTHER): Payer: Medicare Other | Admitting: Cardiovascular Disease

## 2013-05-12 VITALS — BP 128/74 | HR 59 | Ht 65.0 in | Wt 144.6 lb

## 2013-05-12 DIAGNOSIS — I2510 Atherosclerotic heart disease of native coronary artery without angina pectoris: Secondary | ICD-10-CM

## 2013-05-12 NOTE — Progress Notes (Signed)
   HPI:  79 year old woman presenting for followup evaluation. The patient is followed for coronary artery disease. She presented with an inferior wall MI in 2000 and and was treated with primary PCI using overlapping drug-eluting stents in the right coronary artery. She's had preserved LV function. Her Plavix was stopped in 2012. She had a stress echocardiogram last year that showed no ischemic changes. She presents today for followup.  She continues to complain of dyspnea with exertion. She is short of breath with climbing stairs. However, she is most limited by knee and foot problems. She denies chest pain or pressure, PND, orthopnea, or palpitations. She's compliant with her medications.  Outpatient Encounter Prescriptions as of 05/12/2013  Medication Sig Dispense Refill  . aspirin 81 MG tablet Take 81 mg by mouth daily.        Marland Kitchen atorvastatin (LIPITOR) 10 MG tablet Take 1 tablet by mouth daily.      . Calcium Carbonate-Vitamin D (TH CALCIUM CARBONATE-VITAMIN D) 600-400 MG-UNIT per tablet Take 1 tablet by mouth daily.        . cholecalciferol (VITAMIN D) 1000 UNITS tablet Take 1,000 Units by mouth daily.        Marland Kitchen losartan (COZAAR) 50 MG tablet Take 1 tablet by mouth daily.      . pantoprazole (PROTONIX) 40 MG tablet Take 1 tablet by mouth daily.      . polyethylene glycol (MIRALAX / GLYCOLAX) packet Take 17 g by mouth as needed.        . propranolol ER (INDERAL LA) 60 MG 24 hr capsule TAKE 1 CAPSULE DAILY  30 capsule  1  . Zoledronic Acid (RECLAST IV) Inject into the vein.       No facility-administered encounter medications on file as of 05/12/2013.    No Known Allergies  Past Medical History  Diagnosis Date  . Coronary artery disease   . Unspecified essential hypertension   . Other and unspecified hyperlipidemia   . Methicillin resistant Staphylococcus aureus in conditions classified elsewhere and of unspecified site     ROS: Negative except as per HPI  BP 128/74  Pulse 59  Ht 5'  5" (1.651 m)  Wt 65.59 kg (144 lb 9.6 oz)  BMI 24.06 kg/m2  PHYSICAL EXAM: Pt is alert and oriented, pleasant elderly woman in NAD HEENT: normal Neck: JVP - normal, carotids 2+= without bruits Lungs: CTA bilaterally CV: RRR without murmur or gallop Abd: soft, NT, Positive BS, no hepatomegaly Ext: no C/C/E, distal pulses intact and equal Skin: warm/dry no rash  EKG:  Normal sinus rhythm 59 beats per minute, PA-C, otherwise within normal limits.  ASSESSMENT AND PLAN: 1. Coronary artery disease, native vessel. The patient is stable without anginal symptoms. She is on appropriate medical program which includes aspirin for antiplatelet therapy, beta blocker, and ARB, and a statin drug. I would like to see her back in one year.  2. Hypertension. Blood pressure is controlled on a combination of propranolol and losartan.  3. Hyperlipidemia. Lipids are treated by Dr. Valentina Lucks.  Tonny Bollman 05/12/2013 1:29 PM

## 2013-05-12 NOTE — Patient Instructions (Signed)
Your physician wants you to follow-up in: 1 YEAR with Dr Cooper.  You will receive a reminder letter in the mail two months in advance. If you don't receive a letter, please call our office to schedule the follow-up appointment.  Your physician recommends that you continue on your current medications as directed. Please refer to the Current Medication list given to you today.  

## 2013-06-23 ENCOUNTER — Other Ambulatory Visit: Payer: Self-pay | Admitting: Cardiovascular Disease

## 2014-02-14 ENCOUNTER — Other Ambulatory Visit: Payer: Self-pay | Admitting: Cardiovascular Disease

## 2014-05-19 ENCOUNTER — Ambulatory Visit (INDEPENDENT_AMBULATORY_CARE_PROVIDER_SITE_OTHER): Payer: Medicare Other | Admitting: Cardiovascular Disease

## 2014-05-19 ENCOUNTER — Encounter: Payer: Self-pay | Admitting: Cardiovascular Disease

## 2014-05-19 VITALS — BP 134/76 | HR 59 | Ht 65.0 in | Wt 147.1 lb

## 2014-05-19 DIAGNOSIS — I2510 Atherosclerotic heart disease of native coronary artery without angina pectoris: Secondary | ICD-10-CM

## 2014-05-19 DIAGNOSIS — R0602 Shortness of breath: Secondary | ICD-10-CM

## 2014-05-19 DIAGNOSIS — E785 Hyperlipidemia, unspecified: Secondary | ICD-10-CM

## 2014-05-19 DIAGNOSIS — I10 Essential (primary) hypertension: Secondary | ICD-10-CM

## 2014-05-19 NOTE — Progress Notes (Signed)
HPI:  80 year old woman presenting for followup evaluation. The patient is followed for coronary artery disease after presenting with an inferior wall MI in 2010 and and was treated with primary PCI using overlapping drug-eluting stents in the right coronary artery. She's had preserved LV function. Her Plavix was stopped in 2012. She had a stress Myoview in 2013 that showed no ischemic changes. She presents today for followup.  The patient complains of shortness of breath with activity. This is a long-standing complaint, but seems to be a little bit worse. She describes shortness of breath with walking up stairs. She's able to do water aerobics twice per week. She's had no chest pain or pressure, edema, palpitations, orthopnea, or PND.   Outpatient Encounter Prescriptions as of 05/19/2014  Medication Sig  . aspirin 81 MG tablet Take 81 mg by mouth daily.    Marland Kitchen atorvastatin (LIPITOR) 10 MG tablet Take 1 tablet by mouth daily.  . cholecalciferol (VITAMIN D) 1000 UNITS tablet Take 1,000 Units by mouth daily.    Marland Kitchen losartan (COZAAR) 50 MG tablet Take 1 tablet by mouth daily.  . pantoprazole (PROTONIX) 40 MG tablet Take 1 tablet by mouth daily.  . polyethylene glycol (MIRALAX / GLYCOLAX) packet Take 17 g by mouth as needed.    . propranolol ER (INDERAL LA) 60 MG 24 hr capsule TAKE 1 CAPSULE DAILY  . Zoledronic Acid (RECLAST IV) Inject into the vein.  . [DISCONTINUED] Calcium Carbonate-Vitamin D (TH CALCIUM CARBONATE-VITAMIN D) 600-400 MG-UNIT per tablet Take 1 tablet by mouth daily.      No Known Allergies  Past Medical History  Diagnosis Date  . Coronary artery disease   . Unspecified essential hypertension   . Other and unspecified hyperlipidemia   . Methicillin resistant Staphylococcus aureus in conditions classified elsewhere and of unspecified site     ROS: Negative except as per HPI  BP 134/76  Pulse 59  Ht 5\' 5"  (1.651 m)  Wt 66.733 kg (147 lb 1.9 oz)  BMI 24.48  kg/m2  PHYSICAL EXAM: Pt is alert and oriented, NAD HEENT: normal Neck: JVP - normal, carotids 2+= without bruits Lungs: CTA bilaterally CV: RRR without murmur or gallop Abd: soft, NT, Positive BS, no hepatomegaly Ext: no C/C/E, distal pulses intact and equal Skin: warm/dry no rash  EKG:  Sinus bradycardia 54 beats per minute, otherwise within normal limits.  Stress Myoview 06/18/2012: QPS  Raw Data Images: Images were motion corrected. Soft tissue (breast, diaphragm, bowel activity) surround heart.  Stress Images: Normal homogeneous uptake in all areas of the myocardium.  Rest Images: Normal homogeneous uptake in all areas of the myocardium.  Subtraction (SDS): No evidence of ischemia.  Transient Ischemic Dilatation (Normal <1.22): 0.97  Lung/Heart Ratio (Normal <0.45): 0.25  Quantitative Gated Spect Images  QGS EDV: 74 ml  QGS ESV: 23 ml  Impression  Exercise Capacity: Lexiscan with low level exercise.  BP Response: Normal blood pressure response.  Clinical Symptoms: No chest pain.  ECG Impression: No significant ST segment change suggestive of ischemia.  Comparison with Prior Nuclear Study: \No prior study.  Overall Impression: Normal stress nuclear study.  LV Ejection Fraction: 68%. LV Wall Motion: NL LV Function; NL Wall Motion  Dorris Carnes   ASSESSMENT AND PLAN: 1. Coronary artery disease, native vessel. No anginal symptoms. I do not think her dyspnea is related to myocardial ischemia. She will continue on her current medical program. Her last stress test was reviewed as above.  2. Hyperlipidemia.  Lipids are followed by Dr. Laurann Montana. She takes atorvastatin 10 mg daily.  3. Hypertension. Blood pressure controlled on losartan and propranolol. Unfortunately it does not appear that propranolol has helped her tremor.  4. Exertional dyspnea. Lung fields are clear and there is no major risk of lung disease with no tobacco history. I'm going to check an echocardiogram, but the  patient has had normal LV function in the past. I think diastolic dysfunction and/or deconditioning are the most likely etiologies.  Sherren Mocha 05/19/2014 3:30 PM

## 2014-05-19 NOTE — Patient Instructions (Signed)
Your physician has requested that you have an echocardiogram. Echocardiography is a painless test that uses sound waves to create images of your heart. It provides your doctor with information about the size and shape of your heart and how well your heart's chambers and valves are working. This procedure takes approximately one hour. There are no restrictions for this procedure.  Your physician wants you to follow-up in: 1 YEAR with Dr Cooper. You will receive a reminder letter in the mail two months in advance. If you don't receive a letter, please call our office to schedule the follow-up appointment.  Your physician recommends that you continue on your current medications as directed. Please refer to the Current Medication list given to you today.  

## 2014-06-09 ENCOUNTER — Ambulatory Visit (HOSPITAL_COMMUNITY): Payer: Medicare Other | Attending: Cardiovascular Disease | Admitting: Radiology

## 2014-06-09 DIAGNOSIS — I209 Angina pectoris, unspecified: Secondary | ICD-10-CM | POA: Insufficient documentation

## 2014-06-09 DIAGNOSIS — R0602 Shortness of breath: Secondary | ICD-10-CM | POA: Insufficient documentation

## 2014-06-09 DIAGNOSIS — I2510 Atherosclerotic heart disease of native coronary artery without angina pectoris: Secondary | ICD-10-CM | POA: Insufficient documentation

## 2014-06-09 DIAGNOSIS — I25119 Atherosclerotic heart disease of native coronary artery with unspecified angina pectoris: Secondary | ICD-10-CM

## 2014-06-09 NOTE — Progress Notes (Signed)
Echocardiogram performed.  

## 2014-06-16 ENCOUNTER — Telehealth: Payer: Self-pay | Admitting: Cardiovascular Disease

## 2014-06-16 NOTE — Telephone Encounter (Signed)
New message     Talk to a nurse regarding echo results

## 2014-06-16 NOTE — Telephone Encounter (Signed)
Pt called for more information about her echo results Explained results & meaning to pt  Reassurance given Horton Chin RN

## 2014-06-17 ENCOUNTER — Other Ambulatory Visit: Payer: Self-pay | Admitting: Cardiovascular Disease

## 2014-06-22 ENCOUNTER — Other Ambulatory Visit: Payer: Self-pay | Admitting: Cardiovascular Disease

## 2014-10-17 ENCOUNTER — Other Ambulatory Visit: Payer: Self-pay | Admitting: Cardiovascular Disease

## 2014-12-20 ENCOUNTER — Other Ambulatory Visit: Payer: Self-pay | Admitting: Internal Medicine

## 2014-12-20 ENCOUNTER — Ambulatory Visit
Admission: RE | Admit: 2014-12-20 | Discharge: 2014-12-20 | Disposition: A | Discharge status: Home / Self Care | Payer: Medicare Other | Source: Ambulatory Visit | Attending: Internal Medicine | Admitting: Internal Medicine

## 2014-12-20 DIAGNOSIS — L299 Pruritus, unspecified: Secondary | ICD-10-CM

## 2014-12-21 ENCOUNTER — Telehealth: Payer: Self-pay | Admitting: Cardiovascular Disease

## 2014-12-21 NOTE — Telephone Encounter (Signed)
Spoke with Taylor Mendoza pt's daughter states that pt's medication propanolol 60 mg extended release capsule's co-pay is going up. The insurance will pay for the regular propanolol. Daughter would like to know if this medication can be change to the one that pt can afford.

## 2014-12-21 NOTE — Telephone Encounter (Signed)
New message      Insurance copay went up on the propranolol 60mg  24hr capsule.  They will pay for the propranolol tablets.  Will it be ok to switch to the tablets?  Please let daughter know

## 2014-12-22 NOTE — Telephone Encounter (Signed)
Could change to propranolol 20 mg three times daily with meals, or if prefers twice daily dosing could do metoprolol 25 mg BID.  thx

## 2014-12-22 NOTE — Telephone Encounter (Signed)
Left message on machine for Felipa Eth to contact the office.

## 2014-12-23 MED ORDER — PROPRANOLOL HCL 20 MG PO TABS: 20.0000 mg | ORAL_TABLET | Freq: Three times a day (TID) | ORAL | Status: DC

## 2014-12-23 NOTE — Telephone Encounter (Signed)
Talked to patient's daughter about Dr. Antionette Char suggestions. They agreed to take the Propranolol 20 mg three times daily with meals. Changed order from Propranolol ER 60 mg daily to Propranolol 20 mg three times daily. No other questions at this time.

## 2014-12-23 NOTE — Telephone Encounter (Signed)
F/U         Pt daughter returning call, please contact at [phone].   This is in regards to pt switching medication forms.

## 2014-12-26 ENCOUNTER — Telehealth: Payer: Self-pay | Admitting: Cardiovascular Disease

## 2014-12-26 NOTE — Telephone Encounter (Signed)
New Prob    Pt has some questions regarding possibly switching her blood pressure medication. Please call.

## 2014-12-26 NOTE — Telephone Encounter (Signed)
Spoke with pt who seemed to be confused about her medication and how she should be taking it.  She is reporting that her medication needs to be changed because it costs too much but didn't remember her daughter called and this was taken care of on Friday.  She is unsure of her medication and how she is taking it.  When she checked her bottle she still has the 60 mg tablets.  I instructed her that she needs to continue on these until she runs out and then start the 20 mg tablets tid with meals.  She is concerned about the cost of this.  Advised the cost should be less for the short acting than the ER.  Instructed her that if she has a problem with the cost when she goes to pick it up she should call back.  She stated understanding.  Always instructed her to make sure she did not take both 60 mg and 20 mg tablets together but to finish the 60s then get the 20s.  Pt complains of a itching rash on her neck/chest/shoulder that started in August 2015.  She has been to both the dematologist and Dr Laurann Montana about it.  She will follow up with one of them.

## 2015-03-30 ENCOUNTER — Other Ambulatory Visit: Payer: Self-pay | Admitting: Cardiovascular Disease

## 2015-05-17 ENCOUNTER — Ambulatory Visit (HOSPITAL_COMMUNITY): Payer: Medicare Other | Attending: Cardiology

## 2015-05-17 ENCOUNTER — Other Ambulatory Visit: Payer: Self-pay

## 2015-05-17 DIAGNOSIS — I059 Rheumatic mitral valve disease, unspecified: Secondary | ICD-10-CM

## 2015-05-17 DIAGNOSIS — I77810 Thoracic aortic ectasia: Secondary | ICD-10-CM | POA: Diagnosis not present

## 2015-05-17 DIAGNOSIS — I351 Nonrheumatic aortic (valve) insufficiency: Secondary | ICD-10-CM | POA: Diagnosis not present

## 2015-05-17 DIAGNOSIS — I071 Rheumatic tricuspid insufficiency: Secondary | ICD-10-CM | POA: Diagnosis not present

## 2015-05-17 DIAGNOSIS — I341 Nonrheumatic mitral (valve) prolapse: Secondary | ICD-10-CM | POA: Insufficient documentation

## 2015-05-17 DIAGNOSIS — I340 Nonrheumatic mitral (valve) insufficiency: Secondary | ICD-10-CM | POA: Diagnosis not present

## 2015-05-17 DIAGNOSIS — I371 Nonrheumatic pulmonary valve insufficiency: Secondary | ICD-10-CM | POA: Insufficient documentation

## 2015-05-19 ENCOUNTER — Other Ambulatory Visit: Payer: Self-pay

## 2015-05-19 DIAGNOSIS — I77810 Thoracic aortic ectasia: Secondary | ICD-10-CM

## 2015-05-22 ENCOUNTER — Other Ambulatory Visit (INDEPENDENT_AMBULATORY_CARE_PROVIDER_SITE_OTHER): Payer: Medicare Other | Admitting: *Deleted

## 2015-05-22 DIAGNOSIS — I77810 Thoracic aortic ectasia: Secondary | ICD-10-CM | POA: Diagnosis not present

## 2015-05-22 LAB — BASIC METABOLIC PANEL
BUN: 27 mg/dL — ABNORMAL HIGH (ref 6–23)
CO2: 27 mEq/L (ref 19–32)
CREATININE: 1.21 mg/dL — AB (ref 0.40–1.20)
Calcium: 9.5 mg/dL (ref 8.4–10.5)
Chloride: 103 mEq/L (ref 96–112)
GFR: 45.19 mL/min — AB (ref >60.00)
Glucose, Bld: 80 mg/dL (ref 70–99)
POTASSIUM: 4.5 meq/L (ref 3.5–5.1)
Sodium: 136 mEq/L (ref 135–145)

## 2015-05-23 ENCOUNTER — Other Ambulatory Visit: Payer: Self-pay

## 2015-05-23 DIAGNOSIS — I77810 Thoracic aortic ectasia: Secondary | ICD-10-CM

## 2015-05-25 ENCOUNTER — Other Ambulatory Visit: Payer: Self-pay | Admitting: Cardiovascular Disease

## 2015-06-01 ENCOUNTER — Ambulatory Visit (HOSPITAL_COMMUNITY)
Admission: RE | Admit: 2015-06-01 | Discharge: 2015-06-01 | Disposition: A | Discharge status: Home / Self Care | Payer: Medicare Other | Source: Ambulatory Visit | Attending: Cardiovascular Disease | Admitting: Cardiovascular Disease

## 2015-06-01 DIAGNOSIS — I700 Atherosclerosis of aorta: Secondary | ICD-10-CM | POA: Diagnosis not present

## 2015-06-01 DIAGNOSIS — M899 Disorder of bone, unspecified: Secondary | ICD-10-CM | POA: Insufficient documentation

## 2015-06-01 DIAGNOSIS — I77810 Thoracic aortic ectasia: Secondary | ICD-10-CM | POA: Diagnosis present

## 2015-06-01 DIAGNOSIS — I2510 Atherosclerotic heart disease of native coronary artery without angina pectoris: Secondary | ICD-10-CM | POA: Diagnosis not present

## 2015-06-01 MED ORDER — IOHEXOL 350 MG/ML SOLN
100.0000 mL | Freq: Once | INTRAVENOUS | Status: AC | PRN
  Administered 2015-06-01: 100 mL via INTRAVENOUS

## 2015-06-05 ENCOUNTER — Encounter: Payer: Self-pay | Admitting: Cardiovascular Disease

## 2015-06-05 NOTE — Telephone Encounter (Signed)
New Message   Pt calling in to speak to RN  Pt had a ct scan at the hospital and she wanted to know if Dr, Burt Knack has the results

## 2015-06-06 NOTE — Telephone Encounter (Signed)
This encounter was created in error - please disregard.

## 2015-08-11 ENCOUNTER — Ambulatory Visit (INDEPENDENT_AMBULATORY_CARE_PROVIDER_SITE_OTHER): Payer: Medicare Other | Admitting: Cardiovascular Disease

## 2015-08-11 ENCOUNTER — Other Ambulatory Visit: Payer: Self-pay | Admitting: *Deleted

## 2015-08-11 ENCOUNTER — Encounter: Payer: Self-pay | Admitting: Cardiovascular Disease

## 2015-08-11 VITALS — BP 141/86 | HR 64 | Ht 65.0 in | Wt 138.2 lb

## 2015-08-11 DIAGNOSIS — I10 Essential (primary) hypertension: Secondary | ICD-10-CM | POA: Diagnosis not present

## 2015-08-11 DIAGNOSIS — E785 Hyperlipidemia, unspecified: Secondary | ICD-10-CM | POA: Diagnosis not present

## 2015-08-11 DIAGNOSIS — I2510 Atherosclerotic heart disease of native coronary artery without angina pectoris: Secondary | ICD-10-CM

## 2015-08-11 MED ORDER — METOPROLOL SUCCINATE ER 25 MG PO TB24: 25.0000 mg | ORAL_TABLET | Freq: Every day | ORAL | Status: DC

## 2015-08-11 NOTE — Progress Notes (Signed)
Cardiology Office Note   Date:  08/13/2015   ID:  Taylor Mendoza, DOB 09/30/1930, MRN [id number]  PCP:  Irven Shelling, MD  Cardiologist:  Sherren Mocha, MD    Chief Complaint  Patient presents with  . coronary atherosclerosis     History of Present Illness: Taylor Mendoza is a 81 y.o. female who presents for follow-up evaluation. The patient is followed for coronary artery disease after presenting with an inferior wall MI in 2010 and and was treated with primary PCI using overlapping drug-eluting stents in the right coronary artery. She's had preserved LV function. Her Plavix was stopped in 2012. She had a stress Myoview in 2013 that showed no ischemic changes. She presents today for followup.  She complains of generalized fatigue. No chest pain, edema, orthopnea, or PND. She has longstanding dyspnea with exertion that we have attributed primarily to deconditioning. She has developed a chronic rash and is under evaluation/treatment by dermatology.   Past Medical History  Diagnosis Date  . Coronary artery disease   . Unspecified essential hypertension   . Other and unspecified hyperlipidemia   . Methicillin resistant Staphylococcus aureus in conditions classified elsewhere and of unspecified site     History reviewed. No pertinent past surgical history.  Current Outpatient Prescriptions  Medication Sig Dispense Refill  . aspirin 81 MG tablet Take 81 mg by mouth daily.      Marland Kitchen atorvastatin (LIPITOR) 10 MG tablet Take 1 tablet by mouth daily.    . cholecalciferol (VITAMIN D) 1000 UNITS tablet Take 1,000 Units by mouth daily.      . hydrOXYzine (ATARAX/VISTARIL) 10 MG tablet Take 10 mg by mouth 2 (two) times daily.    Marland Kitchen losartan (COZAAR) 50 MG tablet Take 1 tablet by mouth daily.    . pantoprazole (PROTONIX) 40 MG tablet Take 1 tablet by mouth daily.    . polyethylene glycol (MIRALAX / GLYCOLAX) packet Take 17 g by mouth as needed.      . triamcinolone cream (KENALOG)  0.1 % Apply 1 application topically 2 (two) times daily.    . Zoledronic Acid (RECLAST IV) Inject into the vein.    . metoprolol succinate (TOPROL XL) 25 MG 24 hr tablet Take 1 tablet (25 mg total) by mouth daily. 90 tablet 3   No current facility-administered medications for this visit.    Allergies:   Review of patient's allergies indicates no known allergies.   Social History:  The patient  reports that she has never smoked. She does not have any smokeless tobacco history on file. She reports that she does not drink alcohol or use illicit drugs.   Family History:  The patient's family history includes Stroke in her father.    ROS:  Please see the history of present illness.  Otherwise, review of systems is positive for DOE, rash, easy bruising, balance problems..  All other systems are reviewed and negative.    PHYSICAL EXAM: VS:  BP 141/86 mmHg  Pulse 64  Ht 5\' 5"  (1.651 m)  Wt 138 lb 3.2 oz (62.687 kg)  BMI 23.00 kg/m2 , BMI Body mass index is 23 kg/(m^2). GEN: Well nourished, well developed, in no acute distress HEENT: normal Neck: no JVD, no masses. No carotid bruits Cardiac: RRR without murmur or gallop                Respiratory:  clear to auscultation bilaterally, normal work of breathing GI: soft, nontender, nondistended, + BS MS: no deformity  or atrophy Ext: no pretibial edema, pedal pulses 2+= bilaterally Skin: warm and dry, no rash Neuro:  Strength and sensation are intact Psych: euthymic mood, full affect  EKG:  EKG is ordered today. The ekg ordered today shows sinun rhythm 64 bpm, rightward axis  Recent Labs: 05/22/2015: BUN 27*; Creatinine, Ser 1.21*; Potassium 4.5; Sodium 136   Lipid Panel     Component Value Date/Time   CHOL 145 09/22/2009 1016   TRIG 97.0 09/22/2009 1016   HDL 52.60 09/22/2009 1016   CHOLHDL 3 09/22/2009 1016   VLDL 19.4 09/22/2009 1016   LDLCALC 73 09/22/2009 1016      Wt Readings from Last 3 Encounters:  08/11/15 138 lb 3.2  oz (62.687 kg)  05/19/14 147 lb 1.9 oz (66.733 kg)  05/12/13 144 lb 9.6 oz (65.59 kg)     Cardiac Studies Reviewed: 2D Echo 05/17/2015: Study Conclusions  - Left ventricle: The cavity size was normal. Systolic function was normal. The estimated ejection fraction was in the range of 50% to 55%. Wall motion was normal; there were no regional wall motion abnormalities. Doppler parameters are consistent with abnormal left ventricular relaxation (grade 1 diastolic dysfunction). Doppler parameters are consistent with elevated ventricular end-diastolic filling pressure. - Aortic valve: There was mild to moderate regurgitation. - Mitral valve: There is moderate thickening and prolapse of the anterior mitral valve leaflet associated with mild regurgitation with posteriorly directed jet. There was mild regurgitation. - Left atrium: The atrium was moderately dilated. - Right ventricle: The cavity size was mildly dilated. Wall thickness was normal. Systolic function was normal. - Right atrium: The atrium was moderately dilated. - Tricuspid valve: There was moderate regurgitation. - Pulmonic valve: There was trivial regurgitation. - Pulmonary arteries: Systolic pressure was mildly increased. PA peak pressure: 41 mm Hg (S). - Inferior vena cava: The vessel was normal in size. - Pericardium, extracardiac: There was no pericardial effusion.  Impressions:  - Mild to moderate AI. Mildly dilated ascending aorta measuring 43 mm not seen on the prior study. Anterior mitral valve leaflet prolapse with mild aortic regurgitation. Moderete TR, RVSP 41 mmHg.  CT Angio 06/01/2015: IMPRESSION: 1. Ectasia of the thoracic aorta, as detailed above, most pronounced in the ascending thoracic aorta where where it measures up to 4.1 x 4.0 cm in diameter. No evidence of thoracic aortic dissection at this time. 2. Importantly, in the upper abdomen the origins of both of the right  renal artery and superior mesenteric artery appear moderately narrowed, and the wall of both of these vessels appear to be profoundly thickened. This could suggest an underlying vasculitis. Correlation with CTA of the abdomen and pelvis in the near future could be considered to further evaluate these findings if clinically appropriate. 3. Additional incidental findings, as above.  ASSESSMENT AND PLAN: 1.  CAD, native vessel: no angina. Going well on ASA and atorvastatin without recurrent ischemic events since her initial presentation several years ago.   2. Dilated/ectatic aorta: very mild dilatation - continue to treat HTN and only clinical FU should be necessary  3. HTN: having trouble remembering propranolol TID dosing. Change to Toprol XL 25 mg daily.  4. Aortic insufficiency, mild: no diastolic murmur on exam. Clinical FU planned.  5. Hyperlipidemia: followed by Dr Laurann Montana  6. Mesenteric/celiac atherosclerosis: incidentally found on CT imaging. Pt has some GI symptoms improved with PPI, no clear intestinal angina.  Current medicines are reviewed with the patient today.  The patient does not have concerns regarding medicines.  Labs/ tests ordered today include:   Orders Placed This Encounter  Procedures  . EKG 12-Lead    Disposition:   FU one year  Signed, Sherren Mocha, MD  08/13/2015 9:19 AM    [facility] [address], [place], [place]  [address] Phone: [phone]; Fax: [phone]

## 2015-08-11 NOTE — Patient Instructions (Signed)
Medication Instructions:  Your physician has recommended you make the following change in your medication:  1. STOP Propranolol 2. START Metoprolol Succinate 25mg  take one by mouth daily  Labwork: No new orders.   Testing/Procedures: No new orders.   Follow-Up: Your physician wants you to follow-up in: 1 YEAR with Dr Burt Knack.  You will receive a reminder letter in the mail two months in advance. If you don't receive a letter, please call our office to schedule the follow-up appointment.   Any Other Special Instructions Will Be Listed Below (If Applicable).

## 2015-08-14 ENCOUNTER — Other Ambulatory Visit: Payer: Self-pay | Admitting: Cardiovascular Disease

## 2015-08-21 ENCOUNTER — Telehealth: Payer: Self-pay | Admitting: Cardiovascular Disease

## 2015-08-21 MED ORDER — PROPRANOLOL HCL 20 MG PO TABS
20.0000 mg | ORAL_TABLET | Freq: Three times a day (TID) | ORAL | Status: DC
Start: 2015-08-21 — End: 2016-04-22

## 2015-08-21 NOTE — Telephone Encounter (Signed)
New message    Patient calling     Pt c/o medication issue:  1. Name of Medication: metoprolol succinate 25 mg   2. How are you currently taking this medication (dosage and times per day)?  With food once a day   3. Are you having a reaction (difficulty breathing--STAT)? Nervous, last night legs hurt all night ,     4. What is your medication issue? Asking to change medication

## 2015-08-21 NOTE — Telephone Encounter (Signed)
thx agree 

## 2015-08-21 NOTE — Telephone Encounter (Signed)
I spoke with the pt and she just started taking Metoprolol Succinate 2 days ago.  The pt said this medicine makes her nervous and causes leg pain. The pt would like to go back to taking Propranolol. I have sent a Rx into the pharmacy for Propranolol 20mg  take one tablet by mouth three times a day.  The pt will contact the office if she continues to have issues with beta blocker.

## 2016-01-12 DIAGNOSIS — L309 Dermatitis, unspecified: Secondary | ICD-10-CM | POA: Diagnosis not present

## 2016-01-24 DIAGNOSIS — S90851A Superficial foreign body, right foot, initial encounter: Secondary | ICD-10-CM | POA: Diagnosis not present

## 2016-02-09 DIAGNOSIS — L111 Transient acantholytic dermatosis [Grover]: Secondary | ICD-10-CM | POA: Diagnosis not present

## 2016-02-14 DIAGNOSIS — M21619 Bunion of unspecified foot: Secondary | ICD-10-CM | POA: Diagnosis not present

## 2016-02-14 DIAGNOSIS — S90859A Superficial foreign body, unspecified foot, initial encounter: Secondary | ICD-10-CM | POA: Diagnosis not present

## 2016-02-21 ENCOUNTER — Emergency Department (HOSPITAL_COMMUNITY)
Admission: EM | Admit: 2016-02-21 | Discharge: 2016-02-21 | Disposition: A | Discharge status: Home / Self Care | Payer: Medicare Other | Attending: Emergency Medicine | Admitting: Emergency Medicine

## 2016-02-21 ENCOUNTER — Encounter (HOSPITAL_COMMUNITY): Payer: Self-pay

## 2016-02-21 ENCOUNTER — Emergency Department (HOSPITAL_COMMUNITY): Payer: Medicare Other

## 2016-02-21 DIAGNOSIS — Z8614 Personal history of Methicillin resistant Staphylococcus aureus infection: Secondary | ICD-10-CM | POA: Insufficient documentation

## 2016-02-21 DIAGNOSIS — M25571 Pain in right ankle and joints of right foot: Secondary | ICD-10-CM | POA: Diagnosis not present

## 2016-02-21 DIAGNOSIS — Y9289 Other specified places as the place of occurrence of the external cause: Secondary | ICD-10-CM | POA: Diagnosis not present

## 2016-02-21 DIAGNOSIS — S99912A Unspecified injury of left ankle, initial encounter: Secondary | ICD-10-CM | POA: Diagnosis not present

## 2016-02-21 DIAGNOSIS — S9032XA Contusion of left foot, initial encounter: Secondary | ICD-10-CM | POA: Insufficient documentation

## 2016-02-21 DIAGNOSIS — S92511A Displaced fracture of proximal phalanx of right lesser toe(s), initial encounter for closed fracture: Secondary | ICD-10-CM | POA: Diagnosis not present

## 2016-02-21 DIAGNOSIS — I2510 Atherosclerotic heart disease of native coronary artery without angina pectoris: Secondary | ICD-10-CM | POA: Insufficient documentation

## 2016-02-21 DIAGNOSIS — S92531A Displaced fracture of distal phalanx of right lesser toe(s), initial encounter for closed fracture: Secondary | ICD-10-CM | POA: Diagnosis not present

## 2016-02-21 DIAGNOSIS — Z7982 Long term (current) use of aspirin: Secondary | ICD-10-CM | POA: Diagnosis not present

## 2016-02-21 DIAGNOSIS — S92302A Fracture of unspecified metatarsal bone(s), left foot, initial encounter for closed fracture: Secondary | ICD-10-CM

## 2016-02-21 DIAGNOSIS — W07XXXA Fall from chair, initial encounter: Secondary | ICD-10-CM | POA: Diagnosis not present

## 2016-02-21 DIAGNOSIS — I252 Old myocardial infarction: Secondary | ICD-10-CM | POA: Insufficient documentation

## 2016-02-21 DIAGNOSIS — Z7952 Long term (current) use of systemic steroids: Secondary | ICD-10-CM | POA: Diagnosis not present

## 2016-02-21 DIAGNOSIS — S92322A Displaced fracture of second metatarsal bone, left foot, initial encounter for closed fracture: Secondary | ICD-10-CM | POA: Diagnosis not present

## 2016-02-21 DIAGNOSIS — S92521A Displaced fracture of medial phalanx of right lesser toe(s), initial encounter for closed fracture: Secondary | ICD-10-CM | POA: Diagnosis not present

## 2016-02-21 DIAGNOSIS — E785 Hyperlipidemia, unspecified: Secondary | ICD-10-CM | POA: Insufficient documentation

## 2016-02-21 DIAGNOSIS — Y998 Other external cause status: Secondary | ICD-10-CM | POA: Insufficient documentation

## 2016-02-21 DIAGNOSIS — S9031XA Contusion of right foot, initial encounter: Secondary | ICD-10-CM | POA: Diagnosis not present

## 2016-02-21 DIAGNOSIS — I10 Essential (primary) hypertension: Secondary | ICD-10-CM | POA: Insufficient documentation

## 2016-02-21 DIAGNOSIS — M25572 Pain in left ankle and joints of left foot: Secondary | ICD-10-CM | POA: Diagnosis not present

## 2016-02-21 DIAGNOSIS — S99922A Unspecified injury of left foot, initial encounter: Secondary | ICD-10-CM | POA: Diagnosis present

## 2016-02-21 DIAGNOSIS — Z79899 Other long term (current) drug therapy: Secondary | ICD-10-CM | POA: Insufficient documentation

## 2016-02-21 DIAGNOSIS — Z9861 Coronary angioplasty status: Secondary | ICD-10-CM | POA: Diagnosis not present

## 2016-02-21 DIAGNOSIS — T148 Other injury of unspecified body region: Secondary | ICD-10-CM | POA: Diagnosis not present

## 2016-02-21 DIAGNOSIS — S92911A Unspecified fracture of right toe(s), initial encounter for closed fracture: Secondary | ICD-10-CM

## 2016-02-21 DIAGNOSIS — S92909A Unspecified fracture of unspecified foot, initial encounter for closed fracture: Secondary | ICD-10-CM | POA: Diagnosis not present

## 2016-02-21 DIAGNOSIS — S99911A Unspecified injury of right ankle, initial encounter: Secondary | ICD-10-CM | POA: Insufficient documentation

## 2016-02-21 DIAGNOSIS — Y9389 Activity, other specified: Secondary | ICD-10-CM | POA: Diagnosis not present

## 2016-02-21 DIAGNOSIS — M79673 Pain in unspecified foot: Secondary | ICD-10-CM | POA: Diagnosis not present

## 2016-02-21 HISTORY — DX: Acute myocardial infarction, unspecified: I21.9

## 2016-02-21 MED ORDER — HYDROCODONE-ACETAMINOPHEN 5-325 MG PO TABS
1.0000 | ORAL_TABLET | Freq: Once | ORAL | Status: AC
  Administered 2016-02-21: 1 via ORAL
  Filled 2016-02-21: qty 1

## 2016-02-21 MED ORDER — HYDROCODONE-ACETAMINOPHEN 5-325 MG PO TABS: 1.0000 | ORAL_TABLET | ORAL | Status: DC | PRN

## 2016-02-21 NOTE — ED Provider Notes (Signed)
CSN: [id number]     Arrival date & time 02/21/16  1516 History   First MD Initiated Contact with Patient 02/21/16 1536     Chief Complaint  Patient presents with  . Fall  . Foot Pain     (Consider location/radiation/quality/duration/timing/severity/associated sxs/prior Treatment) HPI  82 year old female presents after a fall with bilateral foot pain. Patient states she was sitting in a chair with her legs crossed and did not realize that her right foot had fallen asleep. When she tried to stand her leg gave out from under her and she fell twisting and hitting both feet. She now has swelling in neck and most of both feet and ankles. She has been unable to get up and walk due to the pain. No weakness or numbness. Did not hit her head. No LOC/syncope or dizziness. Pain is currently severe.  Past Medical History  Diagnosis Date  . Coronary artery disease   . Unspecified essential hypertension   . Other and unspecified hyperlipidemia   . Methicillin resistant Staphylococcus aureus in conditions classified elsewhere and of unspecified site   . MI (myocardial infarction) [facility])    Past Surgical History  Procedure Laterality Date  . Coronary angioplasty with stent placement     Family History  Problem Relation Age of Onset  . Stroke Father    Social History  Substance Use Topics  . Smoking status: Never Smoker   . Smokeless tobacco: None  . Alcohol Use: No   OB History    No data available     Review of Systems  Musculoskeletal: Positive for joint swelling and arthralgias.  Skin: Positive for color change. Negative for wound.  Neurological: Negative for weakness and numbness.  All other systems reviewed and are negative.     Allergies  Review of patient's allergies indicates no known allergies.  Home Medications   Prior to Admission medications   Medication Sig Start Date End Date Taking? Authorizing Provider  aspirin 81 MG tablet Take 81 mg by mouth daily.       Historical Provider, MD  atorvastatin (LIPITOR) 10 MG tablet Take 1 tablet by mouth daily. 04/25/12   Historical Provider, MD  cholecalciferol (VITAMIN D) 1000 UNITS tablet Take 1,000 Units by mouth daily.      Historical Provider, MD  hydrOXYzine (ATARAX/VISTARIL) 10 MG tablet Take 10 mg by mouth 2 (two) times daily.    Historical Provider, MD  losartan (COZAAR) 50 MG tablet Take 1 tablet by mouth daily. 05/02/13   Historical Provider, MD  pantoprazole (PROTONIX) 40 MG tablet Take 1 tablet by mouth daily. 05/07/12   Historical Provider, MD  polyethylene glycol (MIRALAX / GLYCOLAX) packet Take 17 g by mouth as needed.      Historical Provider, MD  propranolol (INDERAL) 20 MG tablet Take 1 tablet (20 mg total) by mouth 3 (three) times daily. 08/21/15   Sherren Mocha, MD  triamcinolone cream (KENALOG) 0.1 % Apply 1 application topically 2 (two) times daily.    Historical Provider, MD  Zoledronic Acid (RECLAST IV) Inject into the vein.    Historical Provider, MD   BP 153/88 mmHg  Pulse 78  Temp(Src) 97.7 F (36.5 C) (Oral)  Resp 18  SpO2 96% Physical Exam  Constitutional: She is oriented to person, place, and time. She appears well-developed and well-nourished.  HENT:  Head: Normocephalic and atraumatic.  Right Ear: External ear normal.  Left Ear: External ear normal.  Nose: Nose normal.  Eyes: Right eye exhibits no  discharge. Left eye exhibits no discharge.  Cardiovascular: Normal rate and regular rhythm.   Pulses:      Dorsalis pedis pulses are 2+ on the right side, and 2+ on the left side.  Pulmonary/Chest: Effort normal.  Abdominal: Soft. She exhibits no distension.  Musculoskeletal:       Right ankle: She exhibits decreased range of motion and swelling. No tenderness.       Left ankle: She exhibits decreased range of motion and swelling. No tenderness.       Right lower leg: She exhibits no tenderness.       Left lower leg: She exhibits no tenderness.       Right foot: There is  decreased range of motion, tenderness and swelling.       Left foot: There is decreased range of motion, tenderness and swelling.       Feet:  No calcaneal tenderness bilaterally Bilateral lateral ecchymosis and distal ecchymosis with tenderness, R>L  Neurological: She is alert and oriented to person, place, and time.  Skin: Skin is warm and dry.  Nursing note and vitals reviewed.   ED Course  Procedures (including critical care time) Labs Review Labs Reviewed - No data to display  Imaging Review Dg Ankle Complete Left  02/21/2016  CLINICAL DATA:  Status post fall today with a left ankle injury. Pain. Initial encounter. EXAM: LEFT ANKLE COMPLETE - 3+ VIEW COMPARISON:  None. FINDINGS: No acute bony or joint abnormality is identified. Small plantar calcaneal spur is seen. Soft tissues are unremarkable. IMPRESSION: No acute abnormality. Electronically Signed   By: Inge Rise M.D.   On: 02/21/2016 16:45   Dg Ankle Complete Right  02/21/2016  CLINICAL DATA:  [place] after standing. Generalized ankle and foot pain. EXAM: RIGHT ANKLE - COMPLETE 3+ VIEW COMPARISON:  None. FINDINGS: There is mild lateral soft tissue swelling. There is a small avulsion fracture of the lateral talus or calcaneus consistent with a small ligamentous avulsion. IMPRESSION: No large or displaced fracture. Probable small ligamentous avulsion fractures of lateral talus and calcaneus. Electronically Signed   By: Nelson Chimes M.D.   On: 02/21/2016 16:47   Dg Foot Complete Left  02/21/2016  CLINICAL DATA:  Patient fell while trying to stand up.  Pain. EXAM: LEFT FOOT - COMPLETE 3+ VIEW COMPARISON:  None. FINDINGS: Three views study shows an acute fracture involving the base of the second toe metatarsal. No other definite fracture visualized. Bones are demineralized. There is hallux valgus deformity with degenerative changes at the MTP joint of the great toe. IMPRESSION: Fracture involving the base of the second metatarsal is  likely comminuted. Electronically Signed   By: Misty Stanley M.D.   On: 02/21/2016 16:45   Dg Foot Complete Right  02/21/2016  CLINICAL DATA:  Fall, ankle and foot pain. EXAM: RIGHT FOOT COMPLETE - 3+ VIEW COMPARISON:  None. FINDINGS: Prominent hallux valgus deformity noted with associated degenerative osseous hypertrophy at the first metatarsophalangeal joint space. There are additional milder degenerative changes amongst the second through fifth interphalangeal joint spaces with associated osseous spurring. Additional degenerative changes noted at the second through fourth tarsometatarsal joint spaces with associated osseous spurring. There is an avulsion fracture fragment at the base of the right second proximal phalanx. No other acute-appearing fracture line or displaced fracture fragment identified. Suspect old healed fracture within the distal fifth metatarsal bone. IMPRESSION: Displaced avulsion fracture fragment at the base of the right second proximal phalanx, medial aspect, with fracture line involving  the articular surface. Alignment at the second metatarsophalangeal joint space remains anatomic. Additional degenerative/chronic findings detailed above. Electronically Signed   By: Franki Cabot M.D.   On: 02/21/2016 16:48   I have personally reviewed and evaluated these images and lab results as part of my medical decision-making.   EKG Interpretation None      MDM   Final diagnoses:  Fracture of second metatarsal bone, left, closed, initial encounter  Fracture, phalanx, foot, right, closed, initial encounter    Patient with a metatarsal fracture on her left foot and a distal phalanx fracture on her right. Discussed with Dr. Rush Farmer of orthopedics who recommends postop shoe or Cam Walker and weightbearing as tolerated. Case manager and social worker have been consulted as well and patient has been set up for a wheelchair, 3 and 1 toilet, and home health. Patient is okay going home and  will be given oral pain control. She is to follow-up with orthopedics in 1 week. Discussed return precautions.    Sherwood Gambler, MD 02/22/16 [phone]

## 2016-02-21 NOTE — ED Notes (Signed)
Bed: [id number] Expected date:  Expected time:  Means of arrival:  Comments: EMS- elderly

## 2016-02-21 NOTE — Progress Notes (Signed)
EDCM went to speak to patient at bedside, however, patient is in testing.

## 2016-02-21 NOTE — Progress Notes (Signed)
EDCM spoke to [place] at [facility] and cancelled wheelchair order for tomorrow as patient now has a wheelchair.

## 2016-02-21 NOTE — Progress Notes (Signed)
Patient suffers from Fractures of right foot and left foot metatarsals which impairs their ability to perform daily activities like mobility, bathing and dressing in the home. A cane, crutch nor walker will not resolve  issue with performing activities of daily living. A wheelchair will allow patient to safely perform daily activities. Patient can safely propel the wheelchair in the home or has a caregiver who can provide assistance.  Accessories: elevating leg rests (ELRs), wheel locks, extensions and anti-tippers.

## 2016-02-21 NOTE — ED Notes (Signed)
PTAR CALLED  °

## 2016-02-21 NOTE — ED Notes (Signed)
EDP at bedside  

## 2016-02-21 NOTE — ED Notes (Signed)
Patient transported to X-ray 

## 2016-02-21 NOTE — ED Notes (Addendum)
Per [facility], pt stood from chair, her feet were asleep and she jsut went down on the floor.  Pt c/o bilat foot pain.  Rates pain 9/10.  Pt denies LOC, hitting head, or taking blood thinners.

## 2016-02-21 NOTE — Care Management Note (Addendum)
Case Management Note  Patient Details  Name: Taylor Mendoza MRN: [id number] Date of Birth: August 28, 1930  Subjective/Objective:    Patient presents to Ed with bilateral foot pain.  Xray revealed fractures in both feet.                Action/Plan:  EDCM spoke to patient and her husband Cherlynn Kaiser at bedside.  EDCM offered patient list of home health agencies in [facility], explained services of which patient's husband chose [facility].  Patient lives with her husband.  EDCM discussed patient with EDP who place home health orders for RN, PT, OT aide and Education officer, museum.  EDCM assessed need for equipment.  Patient will need wheelchair and 3 in 1.  Estill Bamberg at [facility] reports they are unable to deliver wheelchair this evening but will be able to deliver tomorrow at the home.  [facility] does not carry 3 in 1 commodes.  EDCM called and spoke to [place] [facility] [facility] who reports they do not deliver wheelchairs and 3 in 1 commodes anymore, patient will have to pick up at the store.  Patient and patient's husband aware that wheelchair is to be delivered tomorrow to their home and that he will have to pick up 3 in 1 commode at store. Patient's husband concerned about getting patient home.  EDCM discussed with EDRN who will arrange non emergency transport home.  Patient also to go home with fracture pan and female urinal.  No further EDCm needs at this time.  [facility] faxed home health orders to [facility] with confirmation of receipt.   Expected Discharge Date:                  Expected Discharge Plan:  [place]  In-House Referral:     Discharge planning Services  CM Consult  Post Acute Care Choice:  Durable Medical Equipment Choice offered to:  Spouse, Patient  DME Arranged:  3-N-1, Wheelchair manual DME Agency:  [place]  HH Arranged:  RN, PT, OT, Nurse's Aide, Social Work [employer] Agency:  [place]  Status of Service:  Completed, signed off  Medicare Important Message Given:     Date Medicare IM Given:    Medicare IM give by:    Date Additional Medicare IM Given:    Additional Medicare Important Message give by:     If discussed at [place] of Stay Meetings, dates discussed:    Additional CommentsLivia Snellen, RN 02/21/2016, 5:58 PM

## 2016-02-21 NOTE — ED Notes (Signed)
ICE PACKS GIVEN AT [place] [facility] TO PT'S ROOM. HUSBAND TAKING CHAIR TO CAR. EDP MADE AWARE

## 2016-02-21 NOTE — Progress Notes (Signed)
EDP consulted Cm for w/c for pt  Discussed with pt and husband  Choice is [facility] for DME They discussed their church may have one and that they needed the lowest cost for w/c  CM spoke with Sunday Spillers and Raquel Sarna at [facility] [phone] to provide order for w/c and 3n1  Pending call from [facility] DME driver Left CM mobile number

## 2016-02-21 NOTE — ED Notes (Signed)
MD at bedside. 

## 2016-02-21 NOTE — ED Notes (Signed)
ORTHO JON PRESENT WORKING WITH PT

## 2016-02-21 NOTE — Progress Notes (Addendum)
Faxed 3n1 & w/c orders with clinicals to [facility] [phone] Return call from [place] that no available DME today but can deliver w/c only on 02/22/16

## 2016-02-21 NOTE — ED Notes (Signed)
Pt states "rt foot went to sleep" It as done this before" "It's the way I cross my legs' When I stood up my legs collapsed. I am unable to walk. I have used ice.  EDP PRESENT TO EVALUATE

## 2016-02-23 DIAGNOSIS — S92322D Displaced fracture of second metatarsal bone, left foot, subsequent encounter for fracture with routine healing: Secondary | ICD-10-CM | POA: Diagnosis not present

## 2016-02-23 DIAGNOSIS — I252 Old myocardial infarction: Secondary | ICD-10-CM | POA: Diagnosis not present

## 2016-02-23 DIAGNOSIS — I2510 Atherosclerotic heart disease of native coronary artery without angina pectoris: Secondary | ICD-10-CM | POA: Diagnosis not present

## 2016-02-23 DIAGNOSIS — S92511D Displaced fracture of proximal phalanx of right lesser toe(s), subsequent encounter for fracture with routine healing: Secondary | ICD-10-CM | POA: Diagnosis not present

## 2016-02-23 DIAGNOSIS — W19XXXD Unspecified fall, subsequent encounter: Secondary | ICD-10-CM | POA: Diagnosis not present

## 2016-02-23 DIAGNOSIS — Z951 Presence of aortocoronary bypass graft: Secondary | ICD-10-CM | POA: Diagnosis not present

## 2016-02-23 DIAGNOSIS — I10 Essential (primary) hypertension: Secondary | ICD-10-CM | POA: Diagnosis not present

## 2016-02-26 DIAGNOSIS — S92325A Nondisplaced fracture of second metatarsal bone, left foot, initial encounter for closed fracture: Secondary | ICD-10-CM | POA: Diagnosis not present

## 2016-02-26 DIAGNOSIS — S92591A Other fracture of right lesser toe(s), initial encounter for closed fracture: Secondary | ICD-10-CM | POA: Diagnosis not present

## 2016-03-11 DIAGNOSIS — S92591D Other fracture of right lesser toe(s), subsequent encounter for fracture with routine healing: Secondary | ICD-10-CM | POA: Diagnosis not present

## 2016-03-11 DIAGNOSIS — S92325D Nondisplaced fracture of second metatarsal bone, left foot, subsequent encounter for fracture with routine healing: Secondary | ICD-10-CM | POA: Diagnosis not present

## 2016-03-11 DIAGNOSIS — S92511D Displaced fracture of proximal phalanx of right lesser toe(s), subsequent encounter for fracture with routine healing: Secondary | ICD-10-CM | POA: Diagnosis not present

## 2016-03-27 DIAGNOSIS — S92511D Displaced fracture of proximal phalanx of right lesser toe(s), subsequent encounter for fracture with routine healing: Secondary | ICD-10-CM | POA: Diagnosis not present

## 2016-03-27 DIAGNOSIS — S92325D Nondisplaced fracture of second metatarsal bone, left foot, subsequent encounter for fracture with routine healing: Secondary | ICD-10-CM | POA: Diagnosis not present

## 2016-04-22 ENCOUNTER — Other Ambulatory Visit: Payer: Self-pay | Admitting: Cardiovascular Disease

## 2016-04-22 NOTE — Telephone Encounter (Signed)
REFILL 

## 2016-04-24 DIAGNOSIS — S92511D Displaced fracture of proximal phalanx of right lesser toe(s), subsequent encounter for fracture with routine healing: Secondary | ICD-10-CM | POA: Diagnosis not present

## 2016-04-24 DIAGNOSIS — S92325D Nondisplaced fracture of second metatarsal bone, left foot, subsequent encounter for fracture with routine healing: Secondary | ICD-10-CM | POA: Diagnosis not present

## 2016-06-14 DIAGNOSIS — G250 Essential tremor: Secondary | ICD-10-CM | POA: Diagnosis not present

## 2016-06-14 DIAGNOSIS — M810 Age-related osteoporosis without current pathological fracture: Secondary | ICD-10-CM | POA: Diagnosis not present

## 2016-06-14 DIAGNOSIS — I2510 Atherosclerotic heart disease of native coronary artery without angina pectoris: Secondary | ICD-10-CM | POA: Diagnosis not present

## 2016-06-14 DIAGNOSIS — K219 Gastro-esophageal reflux disease without esophagitis: Secondary | ICD-10-CM | POA: Diagnosis not present

## 2016-06-14 DIAGNOSIS — N183 Chronic kidney disease, stage 3 (moderate): Secondary | ICD-10-CM | POA: Diagnosis not present

## 2016-06-14 DIAGNOSIS — L111 Transient acantholytic dermatosis [Grover]: Secondary | ICD-10-CM | POA: Diagnosis not present

## 2016-06-14 DIAGNOSIS — I129 Hypertensive chronic kidney disease with stage 1 through stage 4 chronic kidney disease, or unspecified chronic kidney disease: Secondary | ICD-10-CM | POA: Diagnosis not present

## 2016-08-06 DIAGNOSIS — L111 Transient acantholytic dermatosis [Grover]: Secondary | ICD-10-CM | POA: Diagnosis not present

## 2016-08-06 DIAGNOSIS — L570 Actinic keratosis: Secondary | ICD-10-CM | POA: Diagnosis not present

## 2016-08-06 DIAGNOSIS — L219 Seborrheic dermatitis, unspecified: Secondary | ICD-10-CM | POA: Diagnosis not present

## 2016-08-06 DIAGNOSIS — D485 Neoplasm of uncertain behavior of skin: Secondary | ICD-10-CM | POA: Diagnosis not present

## 2016-08-27 DIAGNOSIS — Z85828 Personal history of other malignant neoplasm of skin: Secondary | ICD-10-CM | POA: Diagnosis not present

## 2016-08-27 DIAGNOSIS — L219 Seborrheic dermatitis, unspecified: Secondary | ICD-10-CM | POA: Diagnosis not present

## 2016-08-27 DIAGNOSIS — L959 Vasculitis limited to the skin, unspecified: Secondary | ICD-10-CM | POA: Diagnosis not present

## 2016-08-27 DIAGNOSIS — L570 Actinic keratosis: Secondary | ICD-10-CM | POA: Diagnosis not present

## 2016-08-27 DIAGNOSIS — L820 Inflamed seborrheic keratosis: Secondary | ICD-10-CM | POA: Diagnosis not present

## 2016-09-17 DIAGNOSIS — Z23 Encounter for immunization: Secondary | ICD-10-CM | POA: Diagnosis not present

## 2016-09-23 ENCOUNTER — Telehealth: Payer: Self-pay | Admitting: Cardiovascular Disease

## 2016-09-23 NOTE — Telephone Encounter (Signed)
New message   Daughter calling arrival at [facility] home on Saturday - mother C/O black out episodes  - spell of tachycardia - patient went  back to baseline.   Blood pressure medication was change a month ago.  no emergency room visit. No energy nothing different.   Can appt be move forward. Or can she be seen today .   Pt C/O BP issue: STAT if pt C/O blurred vision, one-sided weakness or slurred speech  1. What are your last 5 BP readings? 151/106  2. Are you having any other symptoms (ex. Dizziness, headache, blurred vision, passed out)? No   3. What is your BP issue? Wants to discuss with nurse

## 2016-09-23 NOTE — Telephone Encounter (Signed)
I spoke with the pt's daughter who is a Marine scientist.  She said the pt has not had any recent medication changes prior to the spell she had on Saturday.  The pt was vigorously stirring brownies when she became dizzy and felt like she would black out.  The pt had to hold on to the counter to get to a chair. The pt was tachycardic with this episode.  EMS was not called and eventually the pt's pulse returned to normal and symptoms resolved. The pt did not black out and she did not fall with episode.  The pt's daughter would like her evaluated if possible due to this episode.  I have scheduled the pt to see Dr Burt Knack tomorrow. BP is slightly elevated but per the daughter the pt is very anxious about her symptoms.

## 2016-09-24 ENCOUNTER — Ambulatory Visit (INDEPENDENT_AMBULATORY_CARE_PROVIDER_SITE_OTHER): Payer: Medicare Other | Admitting: Cardiovascular Disease

## 2016-09-24 ENCOUNTER — Encounter (INDEPENDENT_AMBULATORY_CARE_PROVIDER_SITE_OTHER): Payer: Self-pay

## 2016-09-24 ENCOUNTER — Ambulatory Visit (INDEPENDENT_AMBULATORY_CARE_PROVIDER_SITE_OTHER): Payer: Medicare Other

## 2016-09-24 ENCOUNTER — Encounter: Payer: Self-pay | Admitting: Cardiovascular Disease

## 2016-09-24 VITALS — BP 118/90 | HR 75 | Ht 65.0 in | Wt 135.0 lb

## 2016-09-24 DIAGNOSIS — R55 Syncope and collapse: Secondary | ICD-10-CM | POA: Diagnosis not present

## 2016-09-24 NOTE — Progress Notes (Signed)
Cardiology Office Note Date:  09/26/2016   ID:  KADYNN SCHANTZ, DOB Dec 14, 1929, MRN [id number]  PCP:  Irven Shelling, MD  Cardiologist:  Sherren Mocha, MD    Chief Complaint  Patient presents with  . Dizziness   History of Present Illness: Taylor Mendoza is a 82 y.o. female who presents for follow-up evaluation. The patient is followed for coronary artery disease after presenting with an inferior wall MI in 2010 and and was treated with primary PCI using overlapping drug-eluting stents in the right coronary artery. She's had preserved LV function. Her Plavix was stopped in 2012. She had a stress Myoview in 2013 that showed no ischemic changes. She was last seen one year ago and presents today for followup.  She is here today for evaluation after an episode of near-syncope a few days ago. She had been up on her feet cooking for about 20 minutes when she felt weak and dizzy. She leaned over and nearly passed out, but was able to walk 15-20 feet to her chair where she was able to sit down and rest. She felt a 'hard palpitation' in her chest afterwards but had no chest pain or pressure. She has chronic shortness of breath but reports no recent change in symptoms. She reports no history of similar episodes. No lightheadedness or residual dizziness.   Past Medical History:  Diagnosis Date  . Coronary artery disease   . Methicillin resistant Staphylococcus aureus in conditions classified elsewhere and of unspecified site   . MI (myocardial infarction)   . Other and unspecified hyperlipidemia   . Unspecified essential hypertension     Past Surgical History:  Procedure Laterality Date  . CORONARY ANGIOPLASTY WITH STENT PLACEMENT      Current Outpatient Prescriptions  Medication Sig Dispense Refill  . acetaminophen (TYLENOL) 325 MG tablet Take 325 mg by mouth daily as needed for moderate pain or headache.    Marland Kitchen aspirin 81 MG tablet Take 81 mg by mouth daily.      Marland Kitchen atorvastatin  (LIPITOR) 10 MG tablet Take 1 tablet by mouth daily.    . cholecalciferol (VITAMIN D) 1000 UNITS tablet Take 1,000 Units by mouth daily as needed.     Marland Kitchen levocetirizine (XYZAL) 5 MG tablet Take 1 tablet by mouth daily.  3  . losartan (COZAAR) 50 MG tablet Take 1 tablet by mouth daily.    . metoprolol succinate (TOPROL-XL) 50 MG 24 hr tablet Take 1 tablet by mouth daily.  11  . pantoprazole (PROTONIX) 40 MG tablet Take 1 tablet by mouth daily.    . polyethylene glycol (MIRALAX / GLYCOLAX) packet Take 17 g by mouth daily as needed for mild constipation.     . triamcinolone cream (KENALOG) 0.1 % Apply 1 application topically 2 (two) times daily.     No current facility-administered medications for this visit.     Allergies:   Review of patient's allergies indicates no known allergies.   Social History:  The patient  reports that she has never smoked. She does not have any smokeless tobacco history on file. She reports that she does not drink alcohol or use drugs.   Family History:  The patient's  family history includes Stroke in her father.   ROS:  Please see the history of present illness.  Otherwise, review of systems is positive for dizziness, easy bruising, excessive fatigue, shortness of breath with exertion, tremor, and anxiety.  All other systems are reviewed and negative.   PHYSICAL  EXAM: VS:  BP 118/90   Pulse 75   Ht 5\' 5"  (1.651 m)   Wt 135 lb (61.2 kg)   BMI 22.47 kg/m  , BMI Body mass index is 22.47 kg/m. GEN: Well nourished, well developed, in no acute distress  HEENT: normal  Neck: no JVD, no masses. No carotid bruits Cardiac: RRR without murmur or gallop                Respiratory:  clear to auscultation bilaterally, normal work of breathing GI: soft, nontender, nondistended, + BS MS: no deformity or atrophy  Ext: no pretibial edema, pedal pulses 2+= bilaterally Skin: warm and dry, no rash Neuro:  Strength and sensation are intact Psych: euthymic mood, full  affect  EKG:  EKG is ordered today. The ekg ordered today shows NSR 75 bpm, rightward axis, otherwise within normal limits  Recent Labs: No results found for requested labs within last 8760 hours.   Lipid Panel     Component Value Date/Time   CHOL 145 09/22/2009 1016   TRIG 97.0 09/22/2009 1016   HDL 52.60 09/22/2009 1016   CHOLHDL 3 09/22/2009 1016   VLDL 19.4 09/22/2009 1016   LDLCALC 73 09/22/2009 1016      Wt Readings from Last 3 Encounters:  09/24/16 135 lb (61.2 kg)  08/11/15 138 lb 3.2 oz (62.7 kg)  05/19/14 147 lb 1.9 oz (66.7 kg)     Cardiac Studies Reviewed: 2D Echo 05/17/2015: Study Conclusions  - Left ventricle: The cavity size was normal. Systolic function was normal. The estimated ejection fraction was in the range of 50% to 55%. Wall motion was normal; there were no regional wall motion abnormalities. Doppler parameters are consistent with abnormal left ventricular relaxation (grade 1 diastolic dysfunction). Doppler parameters are consistent with elevated ventricular end-diastolic filling pressure. - Aortic valve: There was mild to moderate regurgitation. - Mitral valve: There is moderate thickening and prolapse of the anterior mitral valve leaflet associated with mild regurgitation with posteriorly directed jet. There was mild regurgitation. - Left atrium: The atrium was moderately dilated. - Right ventricle: The cavity size was mildly dilated. Wall thickness was normal. Systolic function was normal. - Right atrium: The atrium was moderately dilated. - Tricuspid valve: There was moderate regurgitation. - Pulmonic valve: There was trivial regurgitation. - Pulmonary arteries: Systolic pressure was mildly increased. PA peak pressure: 41 mm Hg (S). - Inferior vena cava: The vessel was normal in size. - Pericardium, extracardiac: There was no pericardial effusion.  Impressions:  - Mild to moderate AI. Mildly dilated ascending aorta  measuring 43 mm not seen on the prior study. Anterior mitral valve leaflet prolapse with mild aortic regurgitation. Moderete TR, RVSP 41 mmHg.   CT Angio 06/01/2015: IMPRESSION: 1. Ectasia of the thoracic aorta, as detailed above, most pronounced in the ascending thoracic aorta where where it measures up to 4.1 x 4.0 cm in diameter. No evidence of thoracic aortic dissection at this time. 2. Importantly, in the upper abdomen the origins of both of the right renal artery and superior mesenteric artery appear moderately narrowed, and the wall of both of these vessels appear to be profoundly thickened. This could suggest an underlying vasculitis. Correlation with CTA of the abdomen and pelvis in the near future could be considered to further evaluate these findings if clinically appropriate. 3. Additional incidental findings, as above.  ASSESSMENT AND PLAN: 1.  CAD, native vessel: no angina. Doing well on ASA and atorvastatin without recurrent ischemic events since  her initial presentation   2. Dilated/ectatic aorta: very mild dilatation - continue to treat HTN and only clinical FU should be necessary  3. HTN:   Tolerating Toprol XL.  4. Aortic insufficiency, mild: no diastolic murmur on exam. Clinical FU planned.  5. Hyperlipidemia: followed by Dr Laurann Montana  6. Syncope: unclear etiology. Possibly a vagal episode since patient had been up on her feet and didn't completely lose consciousness. However, recommend evaluation with a 2D echo and 48 hour Holter monitor to evaluate for arrhythmia. She was counseled regarding adequate fluid hydration.  7. Shortness of breath: chronic, likely related to deconditioning. Update echo as above.   Current medicines are reviewed with the patient today.  The patient does not have concerns regarding medicines.  Labs/ tests ordered today include:   Orders Placed This Encounter  Procedures  . Holter monitor - 48 hour  . EKG 12-Lead  .  ECHOCARDIOGRAM COMPLETE    Disposition:   FU one year unless further problems arise.   Deatra James, MD  09/26/2016 10:22 PM    [facility] [address], [place],   [address] Phone: [phone]; Fax: [phone]

## 2016-09-24 NOTE — Patient Instructions (Addendum)
Medication Instructions:  Your physician recommends that you continue on your current medications as directed. Please refer to the Current Medication list given to you today.  Labwork: No new orders.   Testing/Procedures: Your physician has requested that you have an echocardiogram. Echocardiography is a painless test that uses sound waves to create images of your heart. It provides your doctor with information about the size and shape of your heart and how well your heart's chambers and valves are working. This procedure takes approximately one hour. There are no restrictions for this procedure.  Your physician has recommended that you wear a 48 hour holter monitor. Holter monitors are medical devices that record the heart's electrical activity. Doctors most often use these monitors to diagnose arrhythmias. Arrhythmias are problems with the speed or rhythm of the heartbeat. The monitor is a small, portable device. You can wear one while you do your normal daily activities. This is usually used to diagnose what is causing palpitations/syncope (passing out).  Follow-Up: Your physician wants you to follow-up in: 1 YEAR with Dr Burt Knack.  You will receive a reminder letter in the mail two months in advance. If you don't receive a letter, please call our office to schedule the follow-up appointment.  We will cancel pending appointment in November.    Any Other Special Instructions Will Be Listed Below (If Applicable).     If you need a refill on your cardiac medications before your next appointment, please call your pharmacy.

## 2016-09-26 DIAGNOSIS — R55 Syncope and collapse: Secondary | ICD-10-CM

## 2016-10-04 ENCOUNTER — Ambulatory Visit (HOSPITAL_COMMUNITY): Payer: Medicare Other | Attending: Cardiovascular Disease

## 2016-10-04 ENCOUNTER — Other Ambulatory Visit: Payer: Self-pay

## 2016-10-04 DIAGNOSIS — I313 Pericardial effusion (noninflammatory): Secondary | ICD-10-CM | POA: Diagnosis not present

## 2016-10-04 DIAGNOSIS — I083 Combined rheumatic disorders of mitral, aortic and tricuspid valves: Secondary | ICD-10-CM | POA: Diagnosis not present

## 2016-10-04 DIAGNOSIS — I5030 Unspecified diastolic (congestive) heart failure: Secondary | ICD-10-CM | POA: Insufficient documentation

## 2016-10-04 DIAGNOSIS — R55 Syncope and collapse: Secondary | ICD-10-CM | POA: Diagnosis not present

## 2016-10-08 DIAGNOSIS — L111 Transient acantholytic dermatosis [Grover]: Secondary | ICD-10-CM | POA: Diagnosis not present

## 2016-10-08 DIAGNOSIS — L219 Seborrheic dermatitis, unspecified: Secondary | ICD-10-CM | POA: Diagnosis not present

## 2016-10-08 DIAGNOSIS — Z85828 Personal history of other malignant neoplasm of skin: Secondary | ICD-10-CM | POA: Diagnosis not present

## 2016-10-08 DIAGNOSIS — L570 Actinic keratosis: Secondary | ICD-10-CM | POA: Diagnosis not present

## 2016-10-08 DIAGNOSIS — L820 Inflamed seborrheic keratosis: Secondary | ICD-10-CM | POA: Diagnosis not present

## 2016-10-28 ENCOUNTER — Ambulatory Visit: Payer: Medicare Other | Admitting: Cardiovascular Disease

## 2016-12-20 DIAGNOSIS — I2510 Atherosclerotic heart disease of native coronary artery without angina pectoris: Secondary | ICD-10-CM | POA: Diagnosis not present

## 2016-12-20 DIAGNOSIS — M179 Osteoarthritis of knee, unspecified: Secondary | ICD-10-CM | POA: Diagnosis not present

## 2016-12-20 DIAGNOSIS — G250 Essential tremor: Secondary | ICD-10-CM | POA: Diagnosis not present

## 2016-12-20 DIAGNOSIS — Z1389 Encounter for screening for other disorder: Secondary | ICD-10-CM | POA: Diagnosis not present

## 2016-12-20 DIAGNOSIS — Z0000 Encounter for general adult medical examination without abnormal findings: Secondary | ICD-10-CM | POA: Diagnosis not present

## 2016-12-20 DIAGNOSIS — N183 Chronic kidney disease, stage 3 (moderate): Secondary | ICD-10-CM | POA: Diagnosis not present

## 2016-12-20 DIAGNOSIS — E7800 Pure hypercholesterolemia, unspecified: Secondary | ICD-10-CM | POA: Diagnosis not present

## 2016-12-20 DIAGNOSIS — I129 Hypertensive chronic kidney disease with stage 1 through stage 4 chronic kidney disease, or unspecified chronic kidney disease: Secondary | ICD-10-CM | POA: Diagnosis not present

## 2016-12-20 DIAGNOSIS — K219 Gastro-esophageal reflux disease without esophagitis: Secondary | ICD-10-CM | POA: Diagnosis not present

## 2017-01-10 DIAGNOSIS — J0100 Acute maxillary sinusitis, unspecified: Secondary | ICD-10-CM | POA: Diagnosis not present

## 2017-04-09 DIAGNOSIS — H35371 Puckering of macula, right eye: Secondary | ICD-10-CM | POA: Diagnosis not present

## 2017-06-20 DIAGNOSIS — R300 Dysuria: Secondary | ICD-10-CM | POA: Diagnosis not present

## 2017-06-20 DIAGNOSIS — K219 Gastro-esophageal reflux disease without esophagitis: Secondary | ICD-10-CM | POA: Diagnosis not present

## 2017-06-20 DIAGNOSIS — R251 Tremor, unspecified: Secondary | ICD-10-CM | POA: Diagnosis not present

## 2017-06-20 DIAGNOSIS — I10 Essential (primary) hypertension: Secondary | ICD-10-CM | POA: Diagnosis not present

## 2017-06-23 ENCOUNTER — Encounter: Payer: Self-pay | Admitting: Neurology

## 2017-06-24 DIAGNOSIS — L910 Hypertrophic scar: Secondary | ICD-10-CM | POA: Diagnosis not present

## 2017-06-24 DIAGNOSIS — L218 Other seborrheic dermatitis: Secondary | ICD-10-CM | POA: Diagnosis not present

## 2017-07-07 DIAGNOSIS — L218 Other seborrheic dermatitis: Secondary | ICD-10-CM | POA: Diagnosis not present

## 2017-07-21 NOTE — Progress Notes (Signed)
Taylor Mendoza was seen today in the movement disorders clinic for neurologic consultation at the request of [name], MD.  This patient is accompanied in the office by her daughter who supplements the history.  The consultation is for the evaluation of tremor and to r/o PD.  The records that were made available to me were reviewed.   Specific Symptoms:  Tremor: Yes.  , at least one year per patient - 2 per daughter.  Pt not sure if one hand or both when it started.  Noting in legs now Family hx of similar:  Yes.  , maternal GF had head tremor Voice: yes, softer Sleep: trouble getting and staying asleep  Vivid Dreams:  No.  Acting out dreams:  No. (but husband doesn't sleep in same room with the patient) Wet Pillows: Yes.   Postural symptoms:  Yes.    Falls?  Yes.  , got up out of chair last fall and both feet were asleep and fell and broke both "feet" Bradykinesia symptoms: slow movements and difficulty getting out of a chair Loss of smell:  No. Loss of taste:  No. Urinary Incontinence:  Yes.  , just pad for minor leakage Difficulty Swallowing:  Yes.   per daughter - "chokes often" and has had to have husband do heimleich (patient actually denied swallowing problem and stated it was an anxiety problem) Handwriting, micrographia: Yes.  , maybe a little but has trouble answering that question Trouble with ADL's:  No.  Trouble buttoning clothing: Yes.  , a little Depression:  No., but admits to anxiety Memory changes:  No. Hallucinations:  No.  visual distortions: No. N/V:  No. Lightheaded:  No.  Syncope: No. Diplopia:  No. Dyskinesia:  No.  Neuroimaging of the brain has not previously been performed.    PREVIOUS MEDICATIONS: none to date  ALLERGIES:  No Known Allergies  CURRENT MEDICATIONS:  Outpatient Encounter Prescriptions as of 07/22/2017  Medication Sig  . acetaminophen (TYLENOL) 325 MG tablet Take 325 mg by mouth daily as needed for moderate pain or headache.    Marland Kitchen aspirin 81 MG tablet Take 81 mg by mouth daily.    Marland Kitchen atorvastatin (LIPITOR) 10 MG tablet Take 1 tablet by mouth daily.  . cholecalciferol (VITAMIN D) 1000 UNITS tablet Take 1,000 Units by mouth daily as needed.   Marland Kitchen levocetirizine (XYZAL) 5 MG tablet Take 1 tablet by mouth daily.  Marland Kitchen losartan (COZAAR) 50 MG tablet Take 1 tablet by mouth daily.  . metoprolol succinate (TOPROL-XL) 50 MG 24 hr tablet Take 1 tablet by mouth daily.  . pantoprazole (PROTONIX) 40 MG tablet Take 1 tablet by mouth daily.  . polyethylene glycol (MIRALAX / GLYCOLAX) packet Take 17 g by mouth daily as needed for mild constipation.   . triamcinolone cream (KENALOG) 0.1 % Apply 1 application topically 2 (two) times daily.   No facility-administered encounter medications on file as of 07/22/2017.     PAST MEDICAL HISTORY:   Past Medical History:  Diagnosis Date  . Coronary artery disease   . Methicillin resistant Staphylococcus aureus in conditions classified elsewhere and of unspecified site   . MI (myocardial infarction) ([place])   . Other and unspecified hyperlipidemia   . Unspecified essential hypertension     PAST SURGICAL HISTORY:   Past Surgical History:  Procedure Laterality Date  . CATARACT EXTRACTION, BILATERAL    . CORONARY ANGIOPLASTY WITH STENT PLACEMENT      SOCIAL HISTORY:   Social History  Social History  . Marital status: Married    Spouse name: N/A  . Number of children: N/A  . Years of education: N/A   Occupational History  . Not on file.   Social History Main Topics  . Smoking status: Never Smoker  . Smokeless tobacco: Never Used  . Alcohol use No  . Drug use: No  . Sexual activity: Not on file   Other Topics Concern  . Not on file   Social History Narrative  . No narrative on file    FAMILY HISTORY:   Family Status  Relation Status  . Mother Deceased  . Father Deceased  . Sister Alive  . Brother Alive  . Daughter Alive  . Son Alive    ROS:  A complete 10 system  review of systems was obtained and was unremarkable apart from what is mentioned above.  PHYSICAL EXAMINATION:    VITALS:   Vitals:   07/22/17 1319  BP: 126/68  Pulse: 80  SpO2: 97%  Weight: 136 lb (61.7 kg)  Height: 5\' 7"  (1.702 m)    GEN:  The patient appears stated age and is in NAD. HEENT:  Normocephalic, atraumatic.  The mucous membranes are moist. The superficial temporal arteries are without ropiness or tenderness. CV:  RRR Lungs:  CTAB Neck/HEME:  There are no carotid bruits bilaterally.  Neurological examination:  Orientation:  Montreal Cognitive Assessment  07/22/2017  Visuospatial/ Executive (0/5) 4  Naming (0/3) 3  Attention: Read list of digits (0/2) 1  Attention: Read list of letters (0/1) 1  Attention: Serial 7 subtraction starting at 100 (0/3) 2  Language: Repeat phrase (0/2) 2  Language : Fluency (0/1) 0  Abstraction (0/2) 2  Delayed Recall (0/5) 3  Orientation (0/6) 6  Total 24  Adjusted Score (based on education) 25   Cranial nerves: There is good facial symmetry. Pupils are equal round and reactive to light bilaterally. Fundoscopic exam reveals clear margins bilaterally. Extraocular muscles are intact. The visual fields are full to confrontational testing. The speech is fluent and clear.   There is hypophonic speech.  Soft palate rises symmetrically and there is no tongue deviation. Hearing is intact to conversational tone. Sensation: Sensation is intact to light and pinprick throughout (facial, trunk, extremities). Pinprick is not decreased in a stocking distribution.  Vibration is intact at the bilateral big toe but decreased. There is no extinction with double simultaneous stimulation. There is no sensory dermatomal level identified. Motor: Strength is 5/5 in the bilateral upper and lower extremities.   Shoulder shrug is equal and symmetric.  There is no pronator drift. Deep tendon reflexes: Deep tendon reflexes are 2/4 at the bilateral biceps, triceps,  brachioradialis, patella and achilles. Plantar responses are downgoing bilaterally.  Movement examination: Tone: There is mild increased tone in the bilateral upper extremities.  The tone in the lower extremities is normal.  Abnormal movements: There is LUE resting tremor>RUE resting tremor.  There is jaw tremor.   Coordination:  There is decremation with RAM's, with any form of RAMS, including alternating supination and pronation of the forearm, hand opening and closing, finger taps, heel taps and toe taps bilaterally. Gait and Station: The patient has some difficulty arising out of a deep-seated chair without the use of the hands but is able to do so on 3rd attempt. The patient's stride length is fairly normal but she is not well balanced.  The patient has a positive pull test.      Labs:  Labs were received from her primary care physician.  Labs were dated 06/20/2017.  The only labs received from this date was a urinalysis with urine culture.  ASSESSMENT/PLAN:  1.  Idiopathic Parkinson's disease.  The patient has tremor, bradykinesia, rigidity and postural instability.  -We discussed the diagnosis as well as pathophysiology of the disease.  We discussed treatment options as well as prognostic indicators.  Patient education was provided.  -We discussed that it used to be thought that levodopa would increase risk of melanoma but now it is believed that Parkinsons itself likely increases risk of melanoma. she is to get regular skin checks.  -Greater than 50% of the 60 minute visit was spent in counseling answering questions and talking about what to expect now as well as in the future.  We talked about medication options as well as potential future surgical options.  We talked about safety in the home.  -We decided to add carbidopa/levodopa 25/100.  1/2 tab tid x 1 wk, then 1/2 in am & noon & 1 at night for a week, then 1/2 in am &1 at noon &night for a week, then 1 po tid.  Risks, benefits, side  effects and alternative therapies were discussed.  The opportunity to ask questions was given and they were answered to the best of my ability.  The patient expressed understanding and willingness to follow the outlined treatment protocols.  -I will refer the patient for PT/ ST at [facility].  We talked about the importance of safe, cardiovascular exercise in Parkinson's disease.  -We discussed community resources in the area including patient support groups and community exercise programs for PD and pt education was provided to the patient.  -chem, TSH  2.  Dysphagia  -we will do a MBE.    3.  Follow up is anticipated in the next few months, sooner should new neurologic issues arise.     Cc:  [name], MD

## 2017-07-22 ENCOUNTER — Encounter: Payer: Self-pay | Admitting: Neurology

## 2017-07-22 ENCOUNTER — Other Ambulatory Visit: Payer: Medicare Other

## 2017-07-22 ENCOUNTER — Ambulatory Visit (INDEPENDENT_AMBULATORY_CARE_PROVIDER_SITE_OTHER): Payer: Medicare Other | Admitting: Neurology

## 2017-07-22 VITALS — BP 126/68 | HR 80 | Ht 67.0 in | Wt 136.0 lb

## 2017-07-22 DIAGNOSIS — G20 Parkinson's disease: Secondary | ICD-10-CM

## 2017-07-22 DIAGNOSIS — G20A1 Parkinson's disease without dyskinesia, without mention of fluctuations: Secondary | ICD-10-CM

## 2017-07-22 DIAGNOSIS — Z5181 Encounter for therapeutic drug level monitoring: Secondary | ICD-10-CM | POA: Diagnosis not present

## 2017-07-22 DIAGNOSIS — L218 Other seborrheic dermatitis: Secondary | ICD-10-CM | POA: Diagnosis not present

## 2017-07-22 DIAGNOSIS — R1319 Other dysphagia: Secondary | ICD-10-CM

## 2017-07-22 DIAGNOSIS — L111 Transient acantholytic dermatosis [Grover]: Secondary | ICD-10-CM | POA: Diagnosis not present

## 2017-07-22 DIAGNOSIS — Z85828 Personal history of other malignant neoplasm of skin: Secondary | ICD-10-CM | POA: Diagnosis not present

## 2017-07-22 LAB — TSH: TSH: 0.48 mIU/L

## 2017-07-22 MED ORDER — CARBIDOPA-LEVODOPA 25-100 MG PO TABS
1.0000 | ORAL_TABLET | Freq: Three times a day (TID) | ORAL | 2 refills | Status: DC
Start: 2017-07-22 — End: 2017-10-17

## 2017-07-22 NOTE — Patient Instructions (Addendum)
1. Your provider has requested that you have labwork completed today. Please go to [facility] ([id number]) on the second floor of this building before leaving the office today. You do not need to check in. If you are not called within 15 minutes please check with the front desk.   2. Order given for Physical and Speech Therapy.   3. We have scheduled you at [facility] for your modified barium swallow on 07/29/17 at 11:00 am. Please arrive 15 minutes prior and go to 1st floor radiology. If you need to reschedule for any reason please call [phone].  4. Start Carbidopa Levodopa as follows:  Take 1/2 tablet three times daily, at least 30 minutes before meals, for one week  Then take 1/2 tablet in the morning, 1/2 tablet in the afternoon, 1 tablet in the evening, at least 30 minutes before meals, for one week  Then take 1/2 tablet in the morning, 1 tablet in the afternoon, 1 tablet in the evening, at least 30 minutes before meals, for one week  Then take 1 tablet three times daily, at least 30 minutes before meals

## 2017-07-23 ENCOUNTER — Telehealth: Payer: Self-pay | Admitting: Neurology

## 2017-07-23 ENCOUNTER — Other Ambulatory Visit (HOSPITAL_COMMUNITY): Payer: Self-pay | Admitting: Neurology

## 2017-07-23 DIAGNOSIS — R1319 Other dysphagia: Secondary | ICD-10-CM

## 2017-07-23 LAB — COMPREHENSIVE METABOLIC PANEL
ALBUMIN: 4.4 g/dL (ref 3.6–5.1)
ALK PHOS: 90 U/L (ref 33–130)
ALT: 15 U/L (ref 6–29)
AST: 20 U/L (ref 10–35)
BILIRUBIN TOTAL: 0.5 mg/dL (ref 0.2–1.2)
BUN: 25 mg/dL (ref 7–25)
CALCIUM: 9.4 mg/dL (ref 8.6–10.4)
CO2: 23 mmol/L (ref 20–32)
Chloride: 106 mmol/L (ref 98–110)
Creat: 1.3 mg/dL — ABNORMAL HIGH (ref 0.60–0.88)
GLUCOSE: 105 mg/dL — AB (ref 65–99)
Potassium: 4.3 mmol/L (ref 3.5–5.3)
Sodium: 142 mmol/L (ref 135–146)
Total Protein: 6.9 g/dL (ref 6.1–8.1)

## 2017-07-23 NOTE — Telephone Encounter (Signed)
Patient made aware. Lab results sent to Dr. Laurann Montana.

## 2017-07-23 NOTE — Telephone Encounter (Signed)
-----   Message from [place], DO sent at 07/23/2017  7:52 AM EDT ----- Let pt know that thyroid is okay.  Kidney function has gone up a little (may be little dehydrated) over the year and should f/u with PCP.  Send copy of this to PCP as well.

## 2017-07-29 ENCOUNTER — Ambulatory Visit (HOSPITAL_COMMUNITY): Payer: Medicare Other

## 2017-07-29 ENCOUNTER — Other Ambulatory Visit (HOSPITAL_COMMUNITY): Payer: Medicare Other

## 2017-07-31 DIAGNOSIS — C44311 Basal cell carcinoma of skin of nose: Secondary | ICD-10-CM | POA: Diagnosis not present

## 2017-08-04 ENCOUNTER — Inpatient Hospital Stay (HOSPITAL_COMMUNITY): Admission: RE | Admit: 2017-08-04 | Payer: Medicare Other | Source: Ambulatory Visit

## 2017-08-04 ENCOUNTER — Ambulatory Visit (HOSPITAL_COMMUNITY): Admission: RE | Admit: 2017-08-04 | Payer: Medicare Other | Source: Ambulatory Visit

## 2017-08-07 ENCOUNTER — Ambulatory Visit (HOSPITAL_COMMUNITY)
Admission: RE | Admit: 2017-08-07 | Discharge: 2017-08-07 | Disposition: A | Discharge status: Home / Self Care | Payer: Medicare Other | Source: Ambulatory Visit | Attending: Neurology | Admitting: Neurology

## 2017-08-07 DIAGNOSIS — G20 Parkinson's disease: Secondary | ICD-10-CM | POA: Diagnosis not present

## 2017-08-07 DIAGNOSIS — R1319 Other dysphagia: Secondary | ICD-10-CM

## 2017-08-08 ENCOUNTER — Other Ambulatory Visit (HOSPITAL_COMMUNITY): Payer: Medicare Other

## 2017-08-08 ENCOUNTER — Ambulatory Visit (HOSPITAL_COMMUNITY): Payer: Medicare Other

## 2017-09-01 DIAGNOSIS — Z23 Encounter for immunization: Secondary | ICD-10-CM | POA: Diagnosis not present

## 2017-09-01 DIAGNOSIS — I129 Hypertensive chronic kidney disease with stage 1 through stage 4 chronic kidney disease, or unspecified chronic kidney disease: Secondary | ICD-10-CM | POA: Diagnosis not present

## 2017-09-01 DIAGNOSIS — G20 Parkinson's disease: Secondary | ICD-10-CM | POA: Diagnosis not present

## 2017-09-03 DIAGNOSIS — C44311 Basal cell carcinoma of skin of nose: Secondary | ICD-10-CM | POA: Diagnosis not present

## 2017-10-17 ENCOUNTER — Other Ambulatory Visit: Payer: Self-pay | Admitting: Neurology

## 2017-11-13 ENCOUNTER — Ambulatory Visit: Payer: Medicare Other | Admitting: Cardiovascular Disease

## 2017-11-18 NOTE — Progress Notes (Deleted)
Taylor Mendoza was seen today in the movement disorders clinic for neurologic consultation at the request of [name], MD.  This patient is accompanied in the office by her daughter who supplements the history.  The consultation is for the evaluation of tremor and to r/o PD.  The records that were made available to me were reviewed.   Specific Symptoms:  Tremor: Yes.  , at least one year per patient - 2 per daughter.  Pt not sure if one hand or both when it started.  Noting in legs now Family hx of similar:  Yes.  , maternal GF had head tremor Voice: yes, softer Sleep: trouble getting and staying asleep  Vivid Dreams:  No.  Acting out dreams:  No. (but husband doesn't sleep in same room with the patient) Wet Pillows: Yes.   Postural symptoms:  Yes.    Falls?  Yes.  , got up out of chair last fall and both feet were asleep and fell and broke both "feet" Bradykinesia symptoms: slow movements and difficulty getting out of a chair Loss of smell:  No. Loss of taste:  No. Urinary Incontinence:  Yes.  , just pad for minor leakage Difficulty Swallowing:  Yes.   per daughter - "chokes often" and has had to have husband do heimleich (patient actually denied swallowing problem and stated it was an anxiety problem) Handwriting, micrographia: Yes.  , maybe a little but has trouble answering that question Trouble with ADL's:  No.  Trouble buttoning clothing: Yes.  , a little Depression:  No., but admits to anxiety Memory changes:  No. Hallucinations:  No.  visual distortions: No. N/V:  No. Lightheaded:  No.  Syncope: No. Diplopia:  No. Dyskinesia:  No.  Neuroimaging of the brain has not previously been performed.    11/20/17 update:  Pt seen in follow up. She was dx with PD last visit and started on carbidopa/levodopa and worked up to carbidopa/levodopa 25/100, 1 po tid.  She states that ***.  Pt denies falls.  Pt denies lightheadedness, near syncope.  No hallucinations.  Mood has been  good.  She had a MBE on 08/07/17 and was overall unremarkable.  There was one episode of flash penetration of thin liquids when consumed in conjunction with a pill, which is considered normal for age.  Regular diet with thin liquids was recommended.Marland Kitchen  She was referred for PT/ST at [facility] where [facility] lives.  She states that ***.  PREVIOUS MEDICATIONS: none to date  ALLERGIES:  No Known Allergies  CURRENT MEDICATIONS:  Outpatient Encounter Medications as of 11/20/2017  Medication Sig  . acetaminophen (TYLENOL) 325 MG tablet Take 325 mg by mouth daily as needed for moderate pain or headache.  Marland Kitchen aspirin 81 MG tablet Take 81 mg by mouth daily.    Marland Kitchen atorvastatin (LIPITOR) 10 MG tablet Take 1 tablet by mouth daily.  . carbidopa-levodopa (SINEMET IR) 25-100 MG tablet TAKE 1 TABLET BY MOUTH THREE TIMES DAILY  . cholecalciferol (VITAMIN D) 1000 UNITS tablet Take 1,000 Units by mouth daily as needed.   Marland Kitchen levocetirizine (XYZAL) 5 MG tablet Take 1 tablet by mouth daily.  Marland Kitchen losartan (COZAAR) 50 MG tablet Take 1 tablet by mouth daily.  . metoprolol succinate (TOPROL-XL) 50 MG 24 hr tablet Take 1 tablet by mouth daily.  . pantoprazole (PROTONIX) 40 MG tablet Take 1 tablet by mouth daily.  . polyethylene glycol (MIRALAX / GLYCOLAX) packet Take 17 g by mouth daily  as needed for mild constipation.   . triamcinolone cream (KENALOG) 0.1 % Apply 1 application topically 2 (two) times daily.   No facility-administered encounter medications on file as of 11/20/2017.     PAST MEDICAL HISTORY:   Past Medical History:  Diagnosis Date  . Coronary artery disease   . Methicillin resistant Staphylococcus aureus in conditions classified elsewhere and of unspecified site   . MI [place]ocardial infarction[place] ([place]   . Other and unspecified hyperlipidemia   . Unspecified essential hypertension     PAST SURGICAL HISTORY:   Past Surgical History:  Procedure Laterality Date  . CATARACT EXTRACTION, BILATERAL    .  CORONARY ANGIOPLASTY WITH STENT PLACEMENT      SOCIAL HISTORY:   Social History   Socioeconomic History  . Marital status: Married    Spouse name: Not on file  . Number of children: Not on file  . Years of education: Not on file  . Highest education level: Not on file  Social Needs  . Financial resource strain: Not on file  . Food insecurity - worry: Not on file  . Food insecurity - inability: Not on file  . Transportation needs - medical: Not on file  . Transportation needs - non-medical: Not on file  Occupational History  . Not on file  Tobacco Use  . Smoking status: Never Smoker  . Smokeless tobacco: Never Used  Substance and Sexual Activity  . Alcohol use: No  . Drug use: No  . Sexual activity: Not on file  Other Topics Concern  . Not on file  Social History Narrative  . Not on file    FAMILY HISTORY:   Family Status  Relation Name Status  . Mother  Deceased  . Father  Deceased  . Sister 3 Alive  . Brother  Alive  . Daughter 2 Alive  . Son  Alive    ROS:  A complete 10 system review of systems was obtained and was unremarkable apart from what is mentioned above.  PHYSICAL EXAMINATION:    VITALS:   There were no vitals filed for this visit.  GEN:  The patient appears stated age and is in NAD. HEENT:  Normocephalic, atraumatic.  The mucous membranes are moist. The superficial temporal arteries are without ropiness or tenderness. CV:  RRR Lungs:  CTAB Neck/HEME:  There are no carotid bruits bilaterally.  Neurological examination:  Orientation:  Montreal Cognitive Assessment  07/22/2017  Visuospatial/ Executive (0/5) 4  Naming (0/3) 3  Attention: Read list of digits (0/2) 1  Attention: Read list of letters (0/1) 1  Attention: Serial 7 subtraction starting at 100 (0/3) 2  Language: Repeat phrase (0/2) 2  Language : Fluency (0/1) 0  Abstraction (0/2) 2  Delayed Recall (0/5) 3  Orientation (0/6) 6  Total 24  Adjusted Score (based on education) 25    Cranial nerves: There is good facial symmetry. Pupils are equal round and reactive to light bilaterally. Fundoscopic exam reveals clear margins bilaterally. Extraocular muscles are intact. The visual fields are full to confrontational testing. The speech is fluent and clear.   There is hypophonic speech.  Soft palate rises symmetrically and there is no tongue deviation. Hearing is intact to conversational tone. Sensation: Sensation is intact to light and pinprick throughout (facial, trunk, extremities). Pinprick is not decreased in a stocking distribution.  Vibration is intact at the bilateral big toe but decreased. There is no extinction with double simultaneous stimulation. There is no sensory dermatomal level  identified. Motor: Strength is 5/5 in the bilateral upper and lower extremities.   Shoulder shrug is equal and symmetric.  There is no pronator drift. Deep tendon reflexes: Deep tendon reflexes are 2/4 at the bilateral biceps, triceps, brachioradialis, patella and achilles. Plantar responses are downgoing bilaterally.  Movement examination: Tone: There is mild increased tone in the bilateral upper extremities.  The tone in the lower extremities is normal.  Abnormal movements: There is LUE resting tremor>RUE resting tremor.  There is jaw tremor.   Coordination:  There is decremation with RAM's, with any form of RAMS, including alternating supination and pronation of the forearm, hand opening and closing, finger taps, heel taps and toe taps bilaterally. Gait and Station: The patient has some difficulty arising out of a deep-seated chair without the use of the hands but is able to do so on 3rd attempt. The patient's stride length is fairly normal but she is not well balanced.  The patient has a positive pull test.      Labs:  Labs:    Chemistry      Component Value Date/Time   NA 142 07/22/2017 1448   K 4.3 07/22/2017 1448   CL 106 07/22/2017 1448   CO2 23 07/22/2017 1448   BUN 25  07/22/2017 1448   CREATININE 1.30 (H) 07/22/2017 1448      Component Value Date/Time   CALCIUM 9.4 07/22/2017 1448   ALKPHOS 90 07/22/2017 1448   AST 20 07/22/2017 1448   ALT 15 07/22/2017 1448   BILITOT 0.5 07/22/2017 1448     Lab Results  Component Value Date   TSH 0.48 07/22/2017     ASSESSMENT/PLAN:  1.  Idiopathic Parkinson's disease.  The patient has tremor, bradykinesia, rigidity and postural instability.  -We discussed the diagnosis as well as pathophysiology of the disease.  We discussed treatment options as well as prognostic indicators.  Patient education was provided.  -We discussed that it used to be thought that levodopa would increase risk of melanoma but now it is believed that Parkinsons itself likely increases risk of melanoma. she is to get regular skin checks.  -Greater than 50% of the 60 minute visit was spent in counseling answering questions and talking about what to expect now as well as in the future.  We talked about medication options as well as potential future surgical options.  We talked about safety in the home.  -We decided to add carbidopa/levodopa 25/100.  1/2 tab tid x 1 wk, then 1/2 in am & noon & 1 at night for a week, then 1/2 in am &1 at noon &night for a week, then 1 po tid.  Risks, benefits, side effects and alternative therapies were discussed.  The opportunity to ask questions was given and they were answered to the best of my ability.  The patient expressed understanding and willingness to follow the outlined treatment protocols.  -I will refer the patient for PT/ ST at [facility].  We talked about the importance of safe, cardiovascular exercise in Parkinson's disease.  -We discussed community resources in the area including patient support groups and community exercise programs for PD and pt education was provided to the patient.  -chem, TSH  2.  Dysphagia  -She had a MBE on 08/07/17 and was overall unremarkable.  There was one episode  of flash penetration of thin liquids when consumed in conjunction with a pill, which is considered normal for age.  Regular diet with thin liquids was recommended.  3.  Follow up is anticipated in the next few months, sooner should new neurologic issues arise.     Cc:  [name], MD

## 2017-11-20 ENCOUNTER — Other Ambulatory Visit: Payer: Self-pay | Admitting: Neurology

## 2017-11-20 ENCOUNTER — Ambulatory Visit: Payer: Medicare Other | Admitting: Neurology

## 2017-12-18 NOTE — Progress Notes (Signed)
Taylor Mendoza was seen today in the movement disorders clinic for neurologic consultation at the request of [name], MD.  This patient is accompanied in the office by her daughter who supplements the history.  The consultation is for the evaluation of tremor and to r/o PD.  The records that were made available to me were reviewed.   Specific Symptoms:  Tremor: Yes.  , at least one year per patient - 2 per daughter.  Pt not sure if one hand or both when it started.  Noting in legs now Family hx of similar:  Yes.  , maternal GF had head tremor Voice: yes, softer Sleep: trouble getting and staying asleep  Vivid Dreams:  No.  Acting out dreams:  No. (but husband doesn't sleep in same room with the patient) Wet Pillows: Yes.   Postural symptoms:  Yes.    Falls?  Yes.  , got up out of chair last fall and both feet were asleep and fell and broke both "feet" Bradykinesia symptoms: slow movements and difficulty getting out of a chair Loss of smell:  No. Loss of taste:  No. Urinary Incontinence:  Yes.  , just pad for minor leakage Difficulty Swallowing:  Yes.   per daughter - "chokes often" and has had to have husband do heimleich (patient actually denied swallowing problem and stated it was an anxiety problem) Handwriting, micrographia: Yes.  , maybe a little but has trouble answering that question Trouble with ADL's:  No.  Trouble buttoning clothing: Yes.  , a little Depression:  No., but admits to anxiety Memory changes:  No. Hallucinations:  No.  visual distortions: No. N/V:  No. Lightheaded:  No.  Syncope: No. Diplopia:  No. Dyskinesia:  No.  Neuroimaging of the brain has not previously been performed.    12/19/17 update:  Pt seen in follow up. She was dx with PD last visit and started on carbidopa/levodopa and worked up to carbidopa/levodopa 25/100, 1 po tid.  She states that "it helps my shaking a lot."  Her husband states that she forgets her medication at night but she  denies that.  Her husband states that she forgets it 50-60% of the time.  Pt denies falls.  Pt denies lightheadedness, near syncope.  No hallucinations.  Mood has been good.  She had a MBE on 08/07/17 and was overall unremarkable.  There was one episode of flash penetration of thin liquids when consumed in conjunction with a pill, which is considered normal for age.  Regular diet with thin liquids was recommended.  She was referred for PT/ST at [facility] where she lives.  She states that she is no longer exercising.  Her feet are numb somewhat.  PREVIOUS MEDICATIONS: none to date  ALLERGIES:  No Known Allergies  CURRENT MEDICATIONS:  Outpatient Encounter Medications as of 12/19/2017  Medication Sig  . acetaminophen (TYLENOL) 325 MG tablet Take 325 mg by mouth daily as needed for moderate pain or headache.  Marland Kitchen aspirin 81 MG tablet Take 81 mg by mouth daily.    Marland Kitchen atorvastatin (LIPITOR) 10 MG tablet Take 1 tablet by mouth daily.  . carbidopa-levodopa (SINEMET IR) 25-100 MG tablet TAKE 1 TABLET BY MOUTH THREE TIMES DAILY  . cholecalciferol (VITAMIN D) 1000 UNITS tablet Take 1,000 Units by mouth daily as needed.   Marland Kitchen levocetirizine (XYZAL) 5 MG tablet Take 1 tablet by mouth daily.  Marland Kitchen losartan (COZAAR) 50 MG tablet Take 1 tablet by mouth daily.  Marland Kitchen  metoprolol succinate (TOPROL-XL) 50 MG 24 hr tablet Take 1 tablet by mouth daily.  . pantoprazole (PROTONIX) 40 MG tablet Take 1 tablet by mouth daily.  . polyethylene glycol (MIRALAX / GLYCOLAX) packet Take 17 g by mouth daily as needed for mild constipation.   . triamcinolone cream (KENALOG) 0.1 % Apply 1 application topically 2 (two) times daily.   No facility-administered encounter medications on file as of 12/19/2017.     PAST MEDICAL HISTORY:   Past Medical History:  Diagnosis Date  . Coronary artery disease   . Methicillin resistant Staphylococcus aureus in conditions classified elsewhere and of unspecified site   . MI (myocardial  infarction) ([place])   . Other and unspecified hyperlipidemia   . Unspecified essential hypertension     PAST SURGICAL HISTORY:   Past Surgical History:  Procedure Laterality Date  . CATARACT EXTRACTION, BILATERAL    . CORONARY ANGIOPLASTY WITH STENT PLACEMENT      SOCIAL HISTORY:   Social History   Socioeconomic History  . Marital status: Married    Spouse name: Not on file  . Number of children: Not on file  . Years of education: Not on file  . Highest education level: Not on file  Social Needs  . Financial resource strain: Not on file  . Food insecurity - worry: Not on file  . Food insecurity - inability: Not on file  . Transportation needs - medical: Not on file  . Transportation needs - non-medical: Not on file  Occupational History  . Not on file  Tobacco Use  . Smoking status: Never Smoker  . Smokeless tobacco: Never Used  Substance and Sexual Activity  . Alcohol use: No  . Drug use: No  . Sexual activity: Not on file  Other Topics Concern  . Not on file  Social History Narrative  . Not on file    FAMILY HISTORY:   Family Status  Relation Name Status  . Mother  Deceased  . Father  Deceased  . Sister 3 Alive  . Brother  Alive  . Daughter 2 Alive  . Son  Alive    ROS:  A complete 10 system review of systems was obtained and was unremarkable apart from what is mentioned above.  PHYSICAL EXAMINATION:    VITALS:   Vitals:   12/19/17 1536  BP: 100/66  Pulse: 66  SpO2: 98%  Weight: 137 lb (62.1 kg)  Height: 5\' 6"  (1.676 m)    GEN:  The patient appears stated age and is in NAD. HEENT:  Normocephalic, atraumatic.  The mucous membranes are moist. The superficial temporal arteries are without ropiness or tenderness. CV:  RRR Lungs:  CTAB Neck/HEME:  There are no carotid bruits bilaterally.  Neurological examination:  Orientation:  Montreal Cognitive Assessment  07/22/2017  Visuospatial/ Executive (0/5) 4  Naming (0/3) 3  Attention: Read list of  digits (0/2) 1  Attention: Read list of letters (0/1) 1  Attention: Serial 7 subtraction starting at 100 (0/3) 2  Language: Repeat phrase (0/2) 2  Language : Fluency (0/1) 0  Abstraction (0/2) 2  Delayed Recall (0/5) 3  Orientation (0/6) 6  Total 24  Adjusted Score (based on education) 25   Cranial nerves: There is good facial symmetry. The speech is fluent and clear.   There is hypophonic speech.  Soft palate rises symmetrically and there is no tongue deviation. Hearing is intact to conversational tone. Sensation: Sensation is intact to light touch throughout Motor: Strength is  5/5 in the bilateral upper and lower extremities.   Shoulder shrug is equal and symmetric.  There is no pronator drift.  Movement examination: Tone: There is normal tone in the UE/LE Abnormal movements: There is rare tremor in the R thumb  Coordination:  There is good RAMs today Gait and Station: The patient has trouble getting out of the chair without using her hands.  She makes 2 unsuccessful attempts and then pushes off of the chair.  She walks with just a bit of instability.      Labs:  Labs:    Chemistry      Component Value Date/Time   NA 142 07/22/2017 1448   K 4.3 07/22/2017 1448   CL 106 07/22/2017 1448   CO2 23 07/22/2017 1448   BUN 25 07/22/2017 1448   CREATININE 1.30 (H) 07/22/2017 1448      Component Value Date/Time   CALCIUM 9.4 07/22/2017 1448   ALKPHOS 90 07/22/2017 1448   AST 20 07/22/2017 1448   ALT 15 07/22/2017 1448   BILITOT 0.5 07/22/2017 1448     Lab Results  Component Value Date   TSH 0.48 07/22/2017     ASSESSMENT/PLAN:  1.  Idiopathic Parkinson's disease.  The patient has tremor, bradykinesia, rigidity and postural instability.  -We discussed the diagnosis as well as pathophysiology of the disease.  We discussed treatment options as well as prognostic indicators.  Patient education was provided.  -We discussed that it used to be thought that levodopa would  increase risk of melanoma but now it is believed that Parkinsons itself likely increases risk of melanoma. she is to get regular skin checks.  -continue carbidopa/levodopa 25/100 tid.  Risks, benefits, side effects and alternative therapies were discussed.  The opportunity to ask questions was given and they were answered to the best of my ability.  The patient expressed understanding and willingness to follow the outlined treatment protocols.  2.  Dysphagia  -She had a MBE on 08/07/17 and was overall unremarkable.  There was one episode of flash penetration of thin liquids when consumed in conjunction with a pill, which is considered normal for age.  Regular diet with thin liquids was recommended.  3.  Insomnia  -having some day/night reversal.  Talked about regular wake/sleep schedule.  -talked about using melatonin, 3 mg at night.  4.  Follow up is anticipated in the next few months, sooner should new neurologic issues arise.  Much greater than 50% of this visit was spent in counseling and coordinating care.  Total face to face time:  25 min     Cc:  [name], MD

## 2017-12-19 ENCOUNTER — Ambulatory Visit: Payer: Medicare Other | Admitting: Neurology

## 2017-12-19 ENCOUNTER — Encounter: Payer: Self-pay | Admitting: Neurology

## 2017-12-19 VITALS — BP 100/66 | HR 66 | Ht 66.0 in | Wt 137.0 lb

## 2017-12-19 DIAGNOSIS — G20 Parkinson's disease: Secondary | ICD-10-CM | POA: Diagnosis not present

## 2017-12-19 NOTE — Patient Instructions (Signed)
1.  Make sure you are adding safe exercise like stationary biking 2.  Continue carbidopa/levodopa 25/100, one tablet at 8am/noon/4pm 3.  Make sure that you have a regular schedule every day.  If you nap, schedule that nap and set an alarm clock.  The nap should not be greater than 1 hour 4.  You can take melatonin, 3 mg, before bedtime.  You can get that at your pharmacy over the counter

## 2017-12-25 ENCOUNTER — Encounter: Payer: Self-pay | Admitting: Cardiovascular Disease

## 2017-12-25 ENCOUNTER — Ambulatory Visit: Payer: Medicare Other | Admitting: Cardiovascular Disease

## 2017-12-25 VITALS — BP 130/88 | HR 63 | Ht 66.0 in | Wt 138.8 lb

## 2017-12-25 DIAGNOSIS — I10 Essential (primary) hypertension: Secondary | ICD-10-CM

## 2017-12-25 DIAGNOSIS — I2510 Atherosclerotic heart disease of native coronary artery without angina pectoris: Secondary | ICD-10-CM

## 2017-12-25 DIAGNOSIS — I351 Nonrheumatic aortic (valve) insufficiency: Secondary | ICD-10-CM

## 2017-12-25 DIAGNOSIS — I77819 Aortic ectasia, unspecified site: Secondary | ICD-10-CM | POA: Diagnosis not present

## 2017-12-25 DIAGNOSIS — E785 Hyperlipidemia, unspecified: Secondary | ICD-10-CM

## 2017-12-25 NOTE — Progress Notes (Signed)
Cardiology Office Note Date:  12/25/2017   ID:  Taylor Mendoza, DOB Sep 23, 1930, MRN [id number]  PCP:  Taylor Orn, MD  Cardiologist:  Taylor Mocha, MD    Chief Complaint  Patient presents with  . Follow-up     History of Present Illness: Taylor Mendoza is a 84 y.o. female who presents for follow-up of CAD.  She had an acute inferior wall MI in 2010 and was treated with primary PCI of the right coronary artery with overlapping drug-eluting stents.  She had preserved LV systolic function at the time of her infarct.  The patient is here with her daughter today.  Overall she is doing well.  She denies chest pain or pressure, leg swelling, orthopnea, PND, or heart palpitations.  She does have exertional dyspnea which is unchanged over many years.  Her husband has progressive memory problems and they have relocated to assisted living.  She is been diagnosed with Parkinson's disease since her last visit here 1 year ago.   Past Medical History:  Diagnosis Date  . Coronary artery disease   . Methicillin resistant Staphylococcus aureus in conditions classified elsewhere and of unspecified site   . MI (myocardial infarction) ([place])   . Other and unspecified hyperlipidemia   . Unspecified essential hypertension     Past Surgical History:  Procedure Laterality Date  . CATARACT EXTRACTION, BILATERAL    . CORONARY ANGIOPLASTY WITH STENT PLACEMENT      Current Outpatient Medications  Medication Sig Dispense Refill  . acetaminophen (TYLENOL) 325 MG tablet Take 325 mg by mouth daily as needed for moderate pain or headache.    Marland Kitchen aspirin 81 MG tablet Take 81 mg by mouth daily.      Marland Kitchen atorvastatin (LIPITOR) 10 MG tablet Take 1 tablet by mouth daily.    . carbidopa-levodopa (SINEMET IR) 25-100 MG tablet TAKE 1 TABLET BY MOUTH THREE TIMES DAILY 90 tablet 0  . cholecalciferol (VITAMIN D) 1000 UNITS tablet Take 1,000 Units by mouth daily as needed.     Marland Kitchen levocetirizine (XYZAL) 5 MG tablet Take  1 tablet by mouth daily.  3  . losartan (COZAAR) 50 MG tablet Take 1 tablet by mouth daily.    . metoprolol succinate (TOPROL-XL) 50 MG 24 hr tablet Take 1 tablet by mouth daily.  11  . pantoprazole (PROTONIX) 40 MG tablet Take 1 tablet by mouth daily.    . polyethylene glycol (MIRALAX / GLYCOLAX) packet Take 17 g by mouth daily as needed for mild constipation.     . triamcinolone cream (KENALOG) 0.1 % Apply 1 application topically 2 (two) times daily.     No current facility-administered medications for this visit.     Allergies:   Patient has no known allergies.   Social History:  The patient  reports that  has never smoked. she has never used smokeless tobacco. She reports that she does not drink alcohol or use drugs.   Family History:  The patient's  family history includes Osteoporosis in her mother; Skin cancer in her mother; Stroke in her brother and father.    ROS:  Please see the history of present illness.  Otherwise, review of systems is positive for shortness of breath, easy bruising.  All other systems are reviewed and negative.    PHYSICAL EXAM: VS:  BP 130/88   Pulse 63   Ht 5\' 6"  (1.676 m)   Wt 138 lb 12.8 oz (63 kg)   BMI 22.40 kg/m  ,  BMI Body mass index is 22.4 kg/m. GEN: Well nourished, well developed, in no acute distress  HEENT: normal  Neck: no JVD, no masses. No carotid bruits Cardiac: RRR with 1/6 systolic ejection murmur at the RUSB and LLSB                Respiratory:  clear to auscultation bilaterally, normal work of breathing GI: soft, nontender, nondistended, + BS MS: no deformity or atrophy  Ext: no pretibial edema, pedal pulses 2+= bilaterally Skin: warm and dry, no rash Neuro:  Strength and sensation are intact Psych: euthymic mood, full affect  EKG:  EKG is ordered today. The ekg ordered today shows NSR, within normal limits  Recent Labs: 07/22/2017: ALT 15; BUN 25; Creat 1.30; Potassium 4.3; Sodium 142; TSH 0.48   Lipid Panel       Component Value Date/Time   CHOL 145 09/22/2009 1016   TRIG 97.0 09/22/2009 1016   HDL 52.60 09/22/2009 1016   CHOLHDL 3 09/22/2009 1016   VLDL 19.4 09/22/2009 1016   LDLCALC 73 09/22/2009 1016      Wt Readings from Last 3 Encounters:  12/25/17 138 lb 12.8 oz (63 kg)  12/19/17 137 lb (62.1 kg)  07/22/17 136 lb (61.7 kg)     Cardiac Studies Reviewed: Echo 10/04/2016: Study Conclusions  - Left ventricle: The cavity size was normal. Systolic function was   normal. The estimated ejection fraction was in the range of 50%   to 55%. Wall motion was normal; there were no regional wall   motion abnormalities. Features are consistent with a pseudonormal   left ventricular filling pattern, with concomitant abnormal   relaxation and increased filling pressure (grade 2 diastolic   dysfunction). - Aortic valve: There was mild to moderate regurgitation directed   centrally in the LVOT. - Mitral valve: Mild, late systolicprolapse, involving the anterior   leaflet and the posterior leaflet. There was mild regurgitation   directed centrally. - Right atrium: The atrium was mildly dilated. - Tricuspid valve: There was moderate regurgitation directed   centrally. - Pulmonary arteries: Systolic pressure was mildly increased. PA   peak pressure: 43 mm Hg (S). - Pericardium, extracardiac: A trivial pericardial effusion was   identified.  ASSESSMENT AND PLAN: 1.  Coronary artery disease, native vessel, without angina: The patient is stable on aspirin and low-dose atorvastatin.  She is tolerating an ARB and metoprolol.  No changes are recommended today.  2.  Mild valvular heart disease: Patient's last echo is reviewed demonstrating mild to moderate aortic insufficiency, mild mitral regurgitation, and mild tricuspid regurgitation.  Her LVEF is in the low normal range.  I cannot appreciate any significant valvular lesion on her exam today.  We will see her back in 1 year for follow-up.  3.   Hyperlipidemia: Continue atorvastatin 10 mg daily.  Lipids are followed by her primary physician. Last labs reviewed with LDL cholesterol 68 mg/dL.   Current medicines are reviewed with the patient today.  The patient does not have concerns regarding medicines.  Labs/ tests ordered today include:   Orders Placed This Encounter  Procedures  . EKG 12-Lead   Disposition:   FU one year  Signed, Taylor Mocha, MD  12/25/2017 5:32 PM    [facility] [address], [place], [place]  [address] Phone: [phone]; Fax: [phone]

## 2017-12-25 NOTE — Patient Instructions (Signed)

## 2017-12-26 DIAGNOSIS — I129 Hypertensive chronic kidney disease with stage 1 through stage 4 chronic kidney disease, or unspecified chronic kidney disease: Secondary | ICD-10-CM | POA: Diagnosis not present

## 2017-12-26 DIAGNOSIS — K219 Gastro-esophageal reflux disease without esophagitis: Secondary | ICD-10-CM | POA: Diagnosis not present

## 2017-12-26 DIAGNOSIS — N183 Chronic kidney disease, stage 3 (moderate): Secondary | ICD-10-CM | POA: Diagnosis not present

## 2017-12-26 DIAGNOSIS — I2510 Atherosclerotic heart disease of native coronary artery without angina pectoris: Secondary | ICD-10-CM | POA: Diagnosis not present

## 2017-12-31 ENCOUNTER — Telehealth: Payer: Self-pay | Admitting: Neurology

## 2017-12-31 MED ORDER — CARBIDOPA-LEVODOPA 25-100 MG PO TABS: 1.0000 | ORAL_TABLET | Freq: Three times a day (TID) | ORAL | 1 refills | Status: DC

## 2017-12-31 NOTE — Telephone Encounter (Signed)
RX sent to [facility] for Levodopa.

## 2017-12-31 NOTE — Telephone Encounter (Signed)
[facility] called and needs a new prescription faxed  [phone] to them. She has now switch to [facility] from [place]. This is for the medication Carbidopa Levodopa. Thanks

## 2018-01-01 ENCOUNTER — Other Ambulatory Visit: Payer: Self-pay | Admitting: Neurology

## 2018-01-07 ENCOUNTER — Telehealth: Payer: Self-pay | Admitting: Neurology

## 2018-01-07 NOTE — Telephone Encounter (Signed)
Patient said that when she takes her medication Carbidopa Levodopa  in the morning it makes her get sick. Please Call. Thank you

## 2018-01-07 NOTE — Telephone Encounter (Signed)
Spoke with patient and she states the last two mornings she has thrown up after taking her morning dose of Levodopa. She is wondering if she should change medications. She has been on medication since August. She is taking this three times daily. She has not had trouble prior to these last two days.  I did make her aware this sounds unrelated to medication, but she could try eating crackers/toast prior to taking it as she is currently just drinking coffee and eating 30 minutes after to avoid protein.  She will try that and call back if it continues.

## 2018-04-08 DIAGNOSIS — H35371 Puckering of macula, right eye: Secondary | ICD-10-CM | POA: Diagnosis not present

## 2018-04-17 NOTE — Progress Notes (Signed)
Taylor Mendoza was seen today in the movement disorders clinic for neurologic consultation at the request of [name], MD.  This patient is accompanied in the office by her daughter who supplements the history.  The consultation is for the evaluation of tremor and to r/o PD.  The records that were made available to me were reviewed.   Specific Symptoms:  Tremor: Yes.  , at least one year per patient - 2 per daughter.  Pt not sure if one hand or both when it started.  Noting in legs now Family hx of similar:  Yes.  , maternal GF had head tremor Voice: yes, softer Sleep: trouble getting and staying asleep  Vivid Dreams:  No.  Acting out dreams:  No. (but husband doesn't sleep in same room with the patient) Wet Pillows: Yes.   Postural symptoms:  Yes.    Falls?  Yes.  , got up out of chair last fall and both feet were asleep and fell and broke both "feet" Bradykinesia symptoms: slow movements and difficulty getting out of a chair Loss of smell:  No. Loss of taste:  No. Urinary Incontinence:  Yes.  , just pad for minor leakage Difficulty Swallowing:  Yes.   per daughter - "chokes often" and has had to have husband do heimleich (patient actually denied swallowing problem and stated it was an anxiety problem) Handwriting, micrographia: Yes.  , maybe a little but has trouble answering that question Trouble with ADL's:  No.  Trouble buttoning clothing: Yes.  , a little Depression:  No., but admits to anxiety Memory changes:  No. Hallucinations:  No.  visual distortions: No. N/V:  No. Lightheaded:  No.  Syncope: No. Diplopia:  No. Dyskinesia:  No.  Neuroimaging of the brain has not previously been performed.    12/19/17 update:  Pt seen in follow up. She was dx with PD last visit and started on carbidopa/levodopa and worked up to carbidopa/levodopa 25/100, 1 po tid.  She states that "it helps my shaking a lot."  Her husband states that she forgets her medication at night but she  denies that.  Her husband states that she forgets it 50-60% of the time.  Pt denies falls.  Pt denies lightheadedness, near syncope.  No hallucinations.  Mood has been good.  She had a MBE on 08/07/17 and was overall unremarkable.  There was one episode of flash penetration of thin liquids when consumed in conjunction with a pill, which is considered normal for age.  Regular diet with thin liquids was recommended.  She was referred for PT/ST at [facility] where she lives.  She states that she is no longer exercising.  Her feet are numb somewhat.  04/21/18 update: Patient is seen today in follow-up.  She is accompanied by her husband who supplements the history.  Patient is currently on carbidopa/levodopa 25/100, 1 tablet 3 times per day for her Parkinson's disease.  Rare tremor.  Thinks med controls it well.  She has had no falls.  No lightheadedness or near syncope.  No hallucinations.  Mood is good.  She did call me in January to state that she had thrown up 2 mornings in a row after taking her morning levodopa.  She thought perhaps she should change medication.  We advised her that this was likely unrelated to medication as she had been on it since last August.  She was told to try it with a carbohydrate considered empty stomach and call  us back if that did not work.  We have not heard from her.  She reports that is better and she "isn't sure that it was the medication."    She isn't doing much exercise but is walking some.  She has paresthesias of the bottom of the feet and distal legs  PREVIOUS MEDICATIONS: none to date  ALLERGIES:  No Known Allergies  CURRENT MEDICATIONS:  Outpatient Encounter Medications as of 04/21/2018  Medication Sig  . acetaminophen (TYLENOL) 325 MG tablet Take 325 mg by mouth daily as needed for moderate pain or headache.  Marland Kitchen aspirin 81 MG tablet Take 81 mg by mouth daily.    Marland Kitchen atorvastatin (LIPITOR) 10 MG tablet Take 1 tablet by mouth daily.  . carbidopa-levodopa  (SINEMET IR) 25-100 MG tablet Take 1 tablet by mouth 3 (three) times daily.  . cholecalciferol (VITAMIN D) 1000 UNITS tablet Take 1,000 Units by mouth daily as needed.   Marland Kitchen levocetirizine (XYZAL) 5 MG tablet Take 1 tablet by mouth daily.  Marland Kitchen losartan (COZAAR) 50 MG tablet Take 1 tablet by mouth daily.  . metoprolol succinate (TOPROL-XL) 50 MG 24 hr tablet Take 1 tablet by mouth daily.  . pantoprazole (PROTONIX) 40 MG tablet Take 1 tablet by mouth daily.  . polyethylene glycol (MIRALAX / GLYCOLAX) packet Take 17 g by mouth daily as needed for mild constipation.   . triamcinolone cream (KENALOG) 0.1 % Apply 1 application topically 2 (two) times daily.  . [DISCONTINUED] carbidopa-levodopa (SINEMET IR) 25-100 MG tablet Take 1 tablet by mouth 3 (three) times daily.   No facility-administered encounter medications on file as of 04/21/2018.     PAST MEDICAL HISTORY:   Past Medical History:  Diagnosis Date  . Coronary artery disease   . Methicillin resistant Staphylococcus aureus in conditions classified elsewhere and of unspecified site   . MI (myocardial infarction) ([place])   . Other and unspecified hyperlipidemia   . Unspecified essential hypertension     PAST SURGICAL HISTORY:   Past Surgical History:  Procedure Laterality Date  . CATARACT EXTRACTION, BILATERAL    . CORONARY ANGIOPLASTY WITH STENT PLACEMENT      SOCIAL HISTORY:   Social History   Socioeconomic History  . Marital status: Married    Spouse name: Not on file  . Number of children: Not on file  . Years of education: Not on file  . Highest education level: Not on file  Occupational History  . Not on file  Social Needs  . Financial resource strain: Not on file  . Food insecurity:    Worry: Not on file    Inability: Not on file  . Transportation needs:    Medical: Not on file    Non-medical: Not on file  Tobacco Use  . Smoking status: Never Smoker  . Smokeless tobacco: Never Used  Substance and Sexual Activity  .  Alcohol use: No  . Drug use: No  . Sexual activity: Not on file  Lifestyle  . Physical activity:    Days per week: Not on file    Minutes per session: Not on file  . Stress: Not on file  Relationships  . Social connections:    Talks on phone: Not on file    Gets together: Not on file    Attends religious service: Not on file    Active member of club or organization: Not on file    Attends meetings of clubs or organizations: Not on file    Relationship  status: Not on file  . Intimate partner violence:    Fear of current or ex partner: Not on file    Emotionally abused: Not on file    Physically abused: Not on file    Forced sexual activity: Not on file  Other Topics Concern  . Not on file  Social History Narrative  . Not on file    FAMILY HISTORY:   Family Status  Relation Name Status  . Mother  Deceased  . Father  Deceased  . Sister 3 Alive  . Brother  Alive  . Daughter 2 Alive  . Son  Alive    ROS:  A complete 10 system review of systems was obtained and was unremarkable apart from what is mentioned above.  PHYSICAL EXAMINATION:    VITALS:   Vitals:   04/21/18 1115  BP: 94/62  Pulse: 86  SpO2: 95%  Weight: 138 lb (62.6 kg)  Height: 5\' 6"  (1.676 m)    GEN:  The patient appears stated age and is in NAD. HEENT:  Normocephalic, atraumatic.  The mucous membranes are moist. The superficial temporal arteries are without ropiness or tenderness. CV:  RRR Lungs:  CTAB Neck/HEME:  There are no carotid bruits bilaterally.  Neurological examination:  Orientation:  Montreal Cognitive Assessment  07/22/2017  Visuospatial/ Executive (0/5) 4  Naming (0/3) 3  Attention: Read list of digits (0/2) 1  Attention: Read list of letters (0/1) 1  Attention: Serial 7 subtraction starting at 100 (0/3) 2  Language: Repeat phrase (0/2) 2  Language : Fluency (0/1) 0  Abstraction (0/2) 2  Delayed Recall (0/5) 3  Orientation (0/6) 6  Total 24  Adjusted Score (based on  education) 25   Cranial nerves: There is good facial symmetry. The speech is fluent and clear.   There is hypophonic speech.  Soft palate rises symmetrically and there is no tongue deviation. Hearing is intact to conversational tone. Sensation: Sensation is intact to light touch throughout Motor: Strength is 5/5 in the bilateral upper and lower extremities.   Shoulder shrug is equal and symmetric.  There is no pronator drift.  Movement examination: Tone: There is normal tone in the UE/LE Abnormal movements: There is rare tremor in the L index finger Coordination:  There is mild decremation with hand opening and closing and finger taps on the left Gait and Station: The patient pushes off of the chair.  She is wide based.  She has a slight antalgic gait Labs:  Labs:    Chemistry      Component Value Date/Time   NA 142 07/22/2017 1448   K 4.3 07/22/2017 1448   CL 106 07/22/2017 1448   CO2 23 07/22/2017 1448   BUN 25 07/22/2017 1448   CREATININE 1.30 (H) 07/22/2017 1448      Component Value Date/Time   CALCIUM 9.4 07/22/2017 1448   ALKPHOS 90 07/22/2017 1448   AST 20 07/22/2017 1448   ALT 15 07/22/2017 1448   BILITOT 0.5 07/22/2017 1448     Lab Results  Component Value Date   TSH 0.48 07/22/2017     ASSESSMENT/PLAN:  1.  Idiopathic Parkinson's disease.  The patient has tremor, bradykinesia, rigidity and postural instability.  -We discussed the diagnosis as well as pathophysiology of the disease.  We discussed treatment options as well as prognostic indicators.  Patient education was provided.  -We discussed that it used to be thought that levodopa would increase risk of melanoma but now it is believed  that Parkinsons itself likely increases risk of melanoma. she is to get regular skin checks.  -continue carbidopa/levodopa 25/100 tid  -discussed in detail importance of adding safe, CV exercise  -discussed drumming program and given information  -invited to PD symposium  -pt  education given  2.  Dysphagia  -She had a MBE on 08/07/17 and was overall unremarkable.  There was one episode of flash penetration of thin liquids when consumed in conjunction with a pill, which is considered normal for age.  Regular diet with thin liquids was recommended.  3.  Insomnia  -now improved  4.  Follow up is anticipated in the next few months, sooner should new neurologic issues arise.  Much greater than 50% of this visit was spent in counseling and coordinating care.  Total face to face time:  25 min     Cc:  [name], MD

## 2018-04-21 ENCOUNTER — Ambulatory Visit: Payer: Medicare Other | Admitting: Neurology

## 2018-04-21 ENCOUNTER — Encounter: Payer: Self-pay | Admitting: Neurology

## 2018-04-21 VITALS — BP 94/62 | HR 86 | Ht 66.0 in | Wt 138.0 lb

## 2018-04-21 DIAGNOSIS — G20 Parkinson's disease: Secondary | ICD-10-CM | POA: Diagnosis not present

## 2018-04-21 MED ORDER — CARBIDOPA-LEVODOPA 25-100 MG PO TABS: 1.0000 | ORAL_TABLET | Freq: Three times a day (TID) | ORAL | 1 refills | Status: DC

## 2018-04-21 NOTE — Patient Instructions (Addendum)
Continue carbidopa/levodopa 25/100, 1 tablet three times per day  Registration is OPEN!    Third Annual Parkinson's Education Symposium   To register: [email or web address]      Search:  [employer] person attending individually Questions: [place], [place] or [email or web address]    [facility]ing [facility] for Parkinson's & Movement Disorders  The [place] [facility] Movement Disorders team know that living well with a movement disorder extends far beyond our clinic walls. We are together with you. Our team is passionate about providing resources to you and your loved ones who are living with Parkinson's disease and movement disorders. Participate in these programs and join our community. These resources are free or low cost!   [place] [facility] Movement Disorders Program is adding:   Innovative educational programs for patients and caregivers.   Support groups for patients and caregivers living with Parkinson's disease.   Parkinson's specific exercise programs.   Custom tailored therapeutic programs that will benefit patient's living with Parkinson's disease.   We are in this together. You can help and contribute to grow these programs and resources in our community. 100% of the funds donated to the [facility] stays right here in our community to support patients and their caregivers.  To make a tax deductible contribution:  -ask for a [facility] Together for Parkinson's envelope in the office today.  - call the Office of Institutional Advancement at [phone].      Drumming for Adults with Parkinson's Thursdays 9:30-10:30 am May 2, May 9, & May 16 & Tuesdays 10:00-11:00 am June 4, June 18, & July 2 Elnora Morrison [employer], [address] [place]. To register: [phone] or email [email or web address]        [facility] for Parkinson's & Movement Disorders    The [place] [facility] Movement Disorders team know that living well with a movement disorder extends far beyond our clinic walls. We are together with you. Our team is passionate about providing resources to you and your loved ones who are living with Parkinson's disease and movement disorders. Participate in these programs and join our community. These resources are free or low cost!   [place] [facility]'s and Movement Disorders Program is adding:   Innovative educational programs for patients and caregivers.   Support groups for patients and caregivers living with Parkinson's disease.   Parkinson's specific exercise programs.   Custom tailored therapeutic programs that will benefit patient's living with Parkinson's disease.   We are in this together. You can help and contribute to grow these programs and resources in our community. 100% of the funds donated to the [facility] stays right here in our community to support patients and their caregivers.  To make a tax deductible contribution:  -ask for a Power Together for Parkinson's envelope in the office today.  - call the Office of Institutional Advancement at [phone].

## 2018-05-29 SURGERY — Surgical Case
Anesthesia: *Unknown

## 2018-06-12 ENCOUNTER — Telehealth: Payer: Self-pay | Admitting: Cardiovascular Disease

## 2018-06-12 DIAGNOSIS — I2510 Atherosclerotic heart disease of native coronary artery without angina pectoris: Secondary | ICD-10-CM

## 2018-06-12 MED ORDER — NITROGLYCERIN 0.4 MG SL SUBL: 0.4000 mg | SUBLINGUAL_TABLET | SUBLINGUAL | 3 refills | Status: AC | PRN

## 2018-06-12 NOTE — Telephone Encounter (Signed)
Returned call to daughter.  [place] lake [place].  She has since returned and is no longer vomiting.  Daughter is concerned because when Pt had last MI in 2013 she was vomiting with slight back pain.   Pt recently fell and broke some ribs so I having back pain.  Daughter asking if Pt should be taken to ER.  Advised daughter to continue to monitor Pt, vomiting could be related to GI upset.  Advised now that Pt has stopped vomiting if she were to start vomiting again would take to ER.    Daughter in agreement with plan.  Daughter requesting nitro be refilled for Pt.  Refilled nitro per DOD.    Will send to  Dr. Antionette Char nurse if further eval needed.

## 2018-06-12 NOTE — Telephone Encounter (Signed)
New Message   Pt's daughter is calling because the pt was vomiting all day yesterday, pt is feeling better today but daughter is worried because she did the same thing when she had her heart attack in 2013 and had to have 3 stints placed. Please call

## 2018-06-18 DIAGNOSIS — I129 Hypertensive chronic kidney disease with stage 1 through stage 4 chronic kidney disease, or unspecified chronic kidney disease: Secondary | ICD-10-CM | POA: Diagnosis not present

## 2018-06-18 DIAGNOSIS — G20 Parkinson's disease: Secondary | ICD-10-CM | POA: Diagnosis not present

## 2018-06-18 DIAGNOSIS — K219 Gastro-esophageal reflux disease without esophagitis: Secondary | ICD-10-CM | POA: Diagnosis not present

## 2018-06-18 DIAGNOSIS — N183 Chronic kidney disease, stage 3 (moderate): Secondary | ICD-10-CM | POA: Diagnosis not present

## 2018-06-19 DIAGNOSIS — E0590 Thyrotoxicosis, unspecified without thyrotoxic crisis or storm: Secondary | ICD-10-CM | POA: Diagnosis not present

## 2018-06-25 DIAGNOSIS — E0590 Thyrotoxicosis, unspecified without thyrotoxic crisis or storm: Secondary | ICD-10-CM | POA: Diagnosis not present

## 2018-06-29 DIAGNOSIS — I252 Old myocardial infarction: Secondary | ICD-10-CM | POA: Diagnosis not present

## 2018-06-29 DIAGNOSIS — I2510 Atherosclerotic heart disease of native coronary artery without angina pectoris: Secondary | ICD-10-CM | POA: Diagnosis not present

## 2018-06-29 DIAGNOSIS — N183 Chronic kidney disease, stage 3 (moderate): Secondary | ICD-10-CM | POA: Diagnosis not present

## 2018-06-29 DIAGNOSIS — M179 Osteoarthritis of knee, unspecified: Secondary | ICD-10-CM | POA: Diagnosis not present

## 2018-08-07 DIAGNOSIS — M179 Osteoarthritis of knee, unspecified: Secondary | ICD-10-CM | POA: Diagnosis not present

## 2018-08-07 DIAGNOSIS — I2510 Atherosclerotic heart disease of native coronary artery without angina pectoris: Secondary | ICD-10-CM | POA: Diagnosis not present

## 2018-08-07 DIAGNOSIS — I252 Old myocardial infarction: Secondary | ICD-10-CM | POA: Diagnosis not present

## 2018-08-07 DIAGNOSIS — N183 Chronic kidney disease, stage 3 (moderate): Secondary | ICD-10-CM | POA: Diagnosis not present

## 2018-08-27 DIAGNOSIS — M179 Osteoarthritis of knee, unspecified: Secondary | ICD-10-CM | POA: Diagnosis not present

## 2018-08-27 DIAGNOSIS — N183 Chronic kidney disease, stage 3 (moderate): Secondary | ICD-10-CM | POA: Diagnosis not present

## 2018-08-27 DIAGNOSIS — M152 Bouchard's nodes (with arthropathy): Secondary | ICD-10-CM | POA: Diagnosis not present

## 2018-08-27 DIAGNOSIS — I2510 Atherosclerotic heart disease of native coronary artery without angina pectoris: Secondary | ICD-10-CM | POA: Diagnosis not present

## 2018-09-02 DIAGNOSIS — E0590 Thyrotoxicosis, unspecified without thyrotoxic crisis or storm: Secondary | ICD-10-CM | POA: Diagnosis not present

## 2018-09-10 DIAGNOSIS — Z23 Encounter for immunization: Secondary | ICD-10-CM | POA: Diagnosis not present

## 2018-09-22 NOTE — Progress Notes (Signed)
Taylor Mendoza was seen today in the movement disorders clinic for neurologic consultation at the request of Taylor Orn, MD.  This patient is accompanied in the office by her daughter who supplements the history.  The consultation is for the evaluation of tremor and to r/o PD.  The records that were made available to me were reviewed.   Specific Symptoms:  Tremor: Yes.  , at least one year per patient - 2 per daughter.  Pt not sure if one hand or both when it started.  Noting in legs now Family hx of similar:  Yes.  , maternal GF had head tremor Voice: yes, softer Sleep: trouble getting and staying asleep  Vivid Dreams:  No.  Acting out dreams:  No. (but husband doesn't sleep in same room with the patient) Wet Pillows: Yes.   Postural symptoms:  Yes.    Falls?  Yes.  , got up out of chair last fall and both feet were asleep and fell and broke both "feet" Bradykinesia symptoms: slow movements and difficulty getting out of a chair Loss of smell:  No. Loss of taste:  No. Urinary Incontinence:  Yes.  , just pad for minor leakage Difficulty Swallowing:  Yes.   per daughter - "chokes often" and has had to have husband do heimleich (patient actually denied swallowing problem and stated it was an anxiety problem) Handwriting, micrographia: Yes.  , maybe a little but has trouble answering that question Trouble with ADL's:  No.  Trouble buttoning clothing: Yes.  , a little Depression:  No., but admits to anxiety Memory changes:  No. Hallucinations:  No.  visual distortions: No. N/V:  No. Lightheaded:  No.  Syncope: No. Diplopia:  No. Dyskinesia:  No.  Neuroimaging of the brain has not previously been performed.    12/19/17 update:  Pt seen in follow up. She was dx with PD last visit and started on carbidopa/levodopa and worked up to carbidopa/levodopa 25/100, 1 po tid.  She states that "it helps my shaking a lot."  Her husband states that she forgets her medication at night but she  denies that.  Her husband states that she forgets it 50-60% of the time.  Pt denies falls.  Pt denies lightheadedness, near syncope.  No hallucinations.  Mood has been good.  She had a MBE on 08/07/17 and was overall unremarkable.  There was one episode of flash penetration of thin liquids when consumed in conjunction with a pill, which is considered normal for age.  Regular diet with thin liquids was recommended.  She was referred for PT/ST at [facility] where she lives.  She states that she is no longer exercising.  Her feet are numb somewhat.  04/21/18 update: Patient is seen today in follow-up.  She is accompanied by her husband who supplements the history.  Patient is currently on carbidopa/levodopa 25/100, 1 tablet 3 times per day for her Parkinson's disease.  Rare tremor.  Thinks med controls it well.  She has had no falls.  No lightheadedness or near syncope.  No hallucinations.  Mood is good.  She did call me in January to state that she had thrown up 2 mornings in a row after taking her morning levodopa.  She thought perhaps she should change medication.  We advised her that this was likely unrelated to medication as she had been on it since last August.  She was told to try it with a carbohydrate considered empty stomach and call  us back if that did not work.  We have not heard from her.  She reports that is better and she "isn't sure that it was the medication."    She isn't doing much exercise but is walking some.  She has paresthesias of the bottom of the feet and distal legs  09/24/18 update: Patient is seen today in follow-up for Parkinson's disease, accompanied by her husband who supplements the history.  Patient is on carbidopa/levodopa 25/100, 1 tablet 3 times per day.  No falls.  No hallucinations.  No lightheadedness or near syncope. Not doing much exercise.  She is having trouble sleeping.  Having knee pain.    PREVIOUS MEDICATIONS: none to date  ALLERGIES:  No Known  Allergies  CURRENT MEDICATIONS:  Outpatient Encounter Medications as of 09/24/2018  Medication Sig  . acetaminophen (TYLENOL) 325 MG tablet Take 325 mg by mouth daily as needed for moderate pain or headache.  Marland Kitchen aspirin 81 MG tablet Take 81 mg by mouth daily.    Marland Kitchen atorvastatin (LIPITOR) 10 MG tablet Take 1 tablet by mouth daily.  . carbidopa-levodopa (SINEMET IR) 25-100 MG tablet Take 1 tablet by mouth 3 (three) times daily.  . cholecalciferol (VITAMIN D) 1000 UNITS tablet Take 1,000 Units by mouth daily as needed.   Marland Kitchen levocetirizine (XYZAL) 5 MG tablet Take 1 tablet by mouth daily.  Marland Kitchen losartan (COZAAR) 50 MG tablet Take 1 tablet by mouth daily.  . metoprolol succinate (TOPROL-XL) 50 MG 24 hr tablet Take 1 tablet by mouth daily.  . pantoprazole (PROTONIX) 40 MG tablet Take 1 tablet by mouth daily.  . polyethylene glycol (MIRALAX / GLYCOLAX) packet Take 17 g by mouth daily as needed for mild constipation.   . triamcinolone cream (KENALOG) 0.1 % Apply 1 application topically 2 (two) times daily.  . nitroGLYCERIN (NITROSTAT) 0.4 MG SL tablet Place 1 tablet (0.4 mg total) under the tongue every 5 (five) minutes as needed for chest pain.   No facility-administered encounter medications on file as of 09/24/2018.     PAST MEDICAL HISTORY:   Past Medical History:  Diagnosis Date  . Coronary artery disease   . Methicillin resistant Staphylococcus aureus in conditions classified elsewhere and of unspecified site   . MI (myocardial infarction) ([place])   . Other and unspecified hyperlipidemia   . Unspecified essential hypertension     PAST SURGICAL HISTORY:   Past Surgical History:  Procedure Laterality Date  . CATARACT EXTRACTION, BILATERAL    . CORONARY ANGIOPLASTY WITH STENT PLACEMENT      SOCIAL HISTORY:   Social History   Socioeconomic History  . Marital status: Married    Spouse name: Not on file  . Number of children: Not on file  . Years of education: Not on file  . Highest  education level: Not on file  Occupational History  . Not on file  Social Needs  . Financial resource strain: Not on file  . Food insecurity:    Worry: Not on file    Inability: Not on file  . Transportation needs:    Medical: Not on file    Non-medical: Not on file  Tobacco Use  . Smoking status: Never Smoker  . Smokeless tobacco: Never Used  Substance and Sexual Activity  . Alcohol use: No  . Drug use: No  . Sexual activity: Not on file  Lifestyle  . Physical activity:    Days per week: Not on file    Minutes per session: Not  on file  . Stress: Not on file  Relationships  . Social connections:    Talks on phone: Not on file    Gets together: Not on file    Attends religious service: Not on file    Active member of club or organization: Not on file    Attends meetings of clubs or organizations: Not on file    Relationship status: Not on file  . Intimate partner violence:    Fear of current or ex partner: Not on file    Emotionally abused: Not on file    Physically abused: Not on file    Forced sexual activity: Not on file  Other Topics Concern  . Not on file  Social History Narrative  . Not on file    FAMILY HISTORY:   Family Status  Relation Name Status  . Mother  Deceased  . Father  Deceased  . Sister 3 Alive  . Brother  Alive  . Daughter 2 Alive  . Son  Alive    ROS:  Review of Systems  Constitutional: Negative.   HENT: Negative.   Respiratory: Negative.   Cardiovascular: Negative.   Gastrointestinal: Negative.   Genitourinary: Negative.   Skin: Negative.     PHYSICAL EXAMINATION:    VITALS:   Vitals:   09/24/18 1104  BP: 108/60  Pulse: 60  SpO2: 95%  Weight: 140 lb (63.5 kg)  Height: 5\' 6"  (1.676 m)    GEN:  The patient appears stated age and is in NAD. HEENT:  Normocephalic, atraumatic.  The mucous membranes are moist. The superficial temporal arteries are without ropiness or tenderness. CV:  RRR Lungs:  CTAB Neck/HEME:  There are  no carotid bruits bilaterally.  Neurological examination:  Orientation:  Montreal Cognitive Assessment  07/22/2017  Visuospatial/ Executive (0/5) 4  Naming (0/3) 3  Attention: Read list of digits (0/2) 1  Attention: Read list of letters (0/1) 1  Attention: Serial 7 subtraction starting at 100 (0/3) 2  Language: Repeat phrase (0/2) 2  Language : Fluency (0/1) 0  Abstraction (0/2) 2  Delayed Recall (0/5) 3  Orientation (0/6) 6  Total 24  Adjusted Score (based on education) 25   Cranial nerves: There is good facial symmetry. The speech is fluent and clear.   There is hypophonic speech.  Soft palate rises symmetrically and there is no tongue deviation. Hearing is intact to conversational tone. Sensation: Sensation is intact to light touch throughout Motor: Strength is 5/5 in the bilateral upper and lower extremities.   Shoulder shrug is equal and symmetric.  There is no pronator drift.  Movement examination: Tone: There is normal tone in the UE/LE Abnormal movements: There is rare tremor in the L index finger Coordination:  There is mild decremation with hand opening and closing and finger taps on the left Gait and Station: The patient pushes off of the chair.  She is wide based.  She has a slight antalgic gait Labs:  Labs:    Chemistry      Component Value Date/Time   NA 142 07/22/2017 1448   K 4.3 07/22/2017 1448   CL 106 07/22/2017 1448   CO2 23 07/22/2017 1448   BUN 25 07/22/2017 1448   CREATININE 1.30 (H) 07/22/2017 1448      Component Value Date/Time   CALCIUM 9.4 07/22/2017 1448   ALKPHOS 90 07/22/2017 1448   AST 20 07/22/2017 1448   ALT 15 07/22/2017 1448   BILITOT 0.5 07/22/2017 1448  Lab Results  Component Value Date   TSH 0.48 07/22/2017     ASSESSMENT/PLAN:  1.  Idiopathic Parkinson's disease.  The patient has tremor, bradykinesia, rigidity and postural instability.  -We discussed the diagnosis as well as pathophysiology of the disease.  We  discussed treatment options as well as prognostic indicators.  Patient education was provided.  -We discussed that it used to be thought that levodopa would increase risk of melanoma but now it is believed that Parkinsons itself likely increases risk of melanoma. she is to get regular skin checks.  -continue carbidopa/levodopa 25/100 tid  -talked to patient about using walker at all times.  She is resistant.  Talked about hip fx's/potential consequences, etc  -she doesn't want to do PT again at [facility].  She will let me know if she changes her mind.    2.  Dysphagia  -She had a MBE on 08/07/17 and was overall unremarkable.  There was one episode of flash penetration of thin liquids when consumed in conjunction with a pill, which is considered normal for age.  Regular diet with thin liquids was recommended.  3.  Insomnia  -try to start melatonin, 3 mg  4.  Knee pain  -discussed that this is not part of PD and she should f/u with PCP  5.  Follow up is anticipated in the next 5 months, sooner should new neurologic issues arise.  Much greater than 50% of this visit was spent in counseling and coordinating care.  Total face to face time:  25 min     Cc:  Taylor Orn, MD

## 2018-09-24 ENCOUNTER — Encounter: Payer: Self-pay | Admitting: Neurology

## 2018-09-24 ENCOUNTER — Ambulatory Visit: Payer: Medicare Other | Admitting: Neurology

## 2018-09-24 VITALS — BP 108/60 | HR 60 | Ht 66.0 in | Wt 140.0 lb

## 2018-09-24 DIAGNOSIS — M25561 Pain in right knee: Secondary | ICD-10-CM | POA: Diagnosis not present

## 2018-09-24 DIAGNOSIS — M25562 Pain in left knee: Secondary | ICD-10-CM

## 2018-09-24 DIAGNOSIS — G4700 Insomnia, unspecified: Secondary | ICD-10-CM

## 2018-09-24 DIAGNOSIS — G20 Parkinson's disease: Secondary | ICD-10-CM | POA: Diagnosis not present

## 2018-09-24 MED ORDER — CARBIDOPA-LEVODOPA 25-100 MG PO TABS: 1.0000 | ORAL_TABLET | Freq: Three times a day (TID) | ORAL | 1 refills | Status: DC

## 2018-09-24 NOTE — Patient Instructions (Addendum)
start melatonin, 3 mg nightly to help sleep  I recommend a walker at all times.  Let me know if you change your mind and want physical therapy.

## 2018-11-27 DIAGNOSIS — I252 Old myocardial infarction: Secondary | ICD-10-CM | POA: Diagnosis not present

## 2018-11-27 DIAGNOSIS — I2510 Atherosclerotic heart disease of native coronary artery without angina pectoris: Secondary | ICD-10-CM | POA: Diagnosis not present

## 2018-11-27 DIAGNOSIS — N183 Chronic kidney disease, stage 3 (moderate): Secondary | ICD-10-CM | POA: Diagnosis not present

## 2018-11-27 DIAGNOSIS — M179 Osteoarthritis of knee, unspecified: Secondary | ICD-10-CM | POA: Diagnosis not present

## 2019-01-12 DIAGNOSIS — Z0000 Encounter for general adult medical examination without abnormal findings: Secondary | ICD-10-CM | POA: Diagnosis not present

## 2019-01-12 DIAGNOSIS — I2510 Atherosclerotic heart disease of native coronary artery without angina pectoris: Secondary | ICD-10-CM | POA: Diagnosis not present

## 2019-01-12 DIAGNOSIS — K219 Gastro-esophageal reflux disease without esophagitis: Secondary | ICD-10-CM | POA: Diagnosis not present

## 2019-01-12 DIAGNOSIS — I129 Hypertensive chronic kidney disease with stage 1 through stage 4 chronic kidney disease, or unspecified chronic kidney disease: Secondary | ICD-10-CM | POA: Diagnosis not present

## 2019-01-12 DIAGNOSIS — Z1389 Encounter for screening for other disorder: Secondary | ICD-10-CM | POA: Diagnosis not present

## 2019-01-12 DIAGNOSIS — N183 Chronic kidney disease, stage 3 (moderate): Secondary | ICD-10-CM | POA: Diagnosis not present

## 2019-02-23 ENCOUNTER — Ambulatory Visit: Payer: Medicare Other | Admitting: Neurology

## 2019-04-16 DIAGNOSIS — I252 Old myocardial infarction: Secondary | ICD-10-CM | POA: Diagnosis not present

## 2019-04-16 DIAGNOSIS — I2510 Atherosclerotic heart disease of native coronary artery without angina pectoris: Secondary | ICD-10-CM | POA: Diagnosis not present

## 2019-04-16 DIAGNOSIS — M179 Osteoarthritis of knee, unspecified: Secondary | ICD-10-CM | POA: Diagnosis not present

## 2019-04-16 DIAGNOSIS — N183 Chronic kidney disease, stage 3 (moderate): Secondary | ICD-10-CM | POA: Diagnosis not present

## 2019-05-06 ENCOUNTER — Ambulatory Visit: Payer: Medicare Other | Admitting: Neurology

## 2019-07-13 ENCOUNTER — Other Ambulatory Visit: Payer: Self-pay | Admitting: Neurology

## 2019-07-13 NOTE — Telephone Encounter (Signed)
Requested Prescriptions   Pending Prescriptions Disp Refills  . carbidopa-levodopa (SINEMET IR) 25-100 MG tablet [Pharmacy Med Name: CARBIDOPA-LEVODOPA 25-100 TAB] 270 tablet 0    Sig: TAKE 1 TABLET BY MOUTH 3 TIMES A DAY.   Rx last filled: 09/24/18 #270 1 refills  Pt last seen: 09/24/18  Follow up appt scheduled: none  rx sent with no refills She needs to follow up as requested by provider at last visit.

## 2019-07-26 DIAGNOSIS — I252 Old myocardial infarction: Secondary | ICD-10-CM | POA: Diagnosis not present

## 2019-07-26 DIAGNOSIS — M179 Osteoarthritis of knee, unspecified: Secondary | ICD-10-CM | POA: Diagnosis not present

## 2019-07-26 DIAGNOSIS — I2510 Atherosclerotic heart disease of native coronary artery without angina pectoris: Secondary | ICD-10-CM | POA: Diagnosis not present

## 2019-07-26 DIAGNOSIS — N183 Chronic kidney disease, stage 3 (moderate): Secondary | ICD-10-CM | POA: Diagnosis not present

## 2019-09-03 DIAGNOSIS — I2510 Atherosclerotic heart disease of native coronary artery without angina pectoris: Secondary | ICD-10-CM | POA: Diagnosis not present

## 2019-09-03 DIAGNOSIS — M179 Osteoarthritis of knee, unspecified: Secondary | ICD-10-CM | POA: Diagnosis not present

## 2019-09-03 DIAGNOSIS — N183 Chronic kidney disease, stage 3 (moderate): Secondary | ICD-10-CM | POA: Diagnosis not present

## 2019-09-03 DIAGNOSIS — I252 Old myocardial infarction: Secondary | ICD-10-CM | POA: Diagnosis not present

## 2019-09-21 DIAGNOSIS — L570 Actinic keratosis: Secondary | ICD-10-CM | POA: Diagnosis not present

## 2019-09-21 DIAGNOSIS — L821 Other seborrheic keratosis: Secondary | ICD-10-CM | POA: Diagnosis not present

## 2019-09-21 DIAGNOSIS — L814 Other melanin hyperpigmentation: Secondary | ICD-10-CM | POA: Diagnosis not present

## 2019-09-21 DIAGNOSIS — Z85828 Personal history of other malignant neoplasm of skin: Secondary | ICD-10-CM | POA: Diagnosis not present

## 2019-09-27 ENCOUNTER — Encounter (HOSPITAL_COMMUNITY): Payer: Self-pay

## 2019-09-27 ENCOUNTER — Telehealth: Payer: Self-pay | Admitting: Neurology

## 2019-09-27 ENCOUNTER — Emergency Department (HOSPITAL_COMMUNITY): Payer: Medicare Other

## 2019-09-27 ENCOUNTER — Other Ambulatory Visit: Payer: Self-pay

## 2019-09-27 ENCOUNTER — Emergency Department (HOSPITAL_COMMUNITY)
Admission: EM | Admit: 2019-09-27 | Discharge: 2019-09-27 | Disposition: A | Discharge status: Home / Self Care | Payer: Medicare Other | Attending: Emergency Medicine | Admitting: Emergency Medicine

## 2019-09-27 DIAGNOSIS — R1110 Vomiting, unspecified: Secondary | ICD-10-CM

## 2019-09-27 DIAGNOSIS — K449 Diaphragmatic hernia without obstruction or gangrene: Secondary | ICD-10-CM | POA: Diagnosis not present

## 2019-09-27 DIAGNOSIS — I2510 Atherosclerotic heart disease of native coronary artery without angina pectoris: Secondary | ICD-10-CM | POA: Diagnosis not present

## 2019-09-27 DIAGNOSIS — K5903 Drug induced constipation: Secondary | ICD-10-CM | POA: Diagnosis not present

## 2019-09-27 DIAGNOSIS — K5900 Constipation, unspecified: Secondary | ICD-10-CM | POA: Diagnosis not present

## 2019-09-27 DIAGNOSIS — Z7982 Long term (current) use of aspirin: Secondary | ICD-10-CM | POA: Diagnosis not present

## 2019-09-27 DIAGNOSIS — Z959 Presence of cardiac and vascular implant and graft, unspecified: Secondary | ICD-10-CM | POA: Insufficient documentation

## 2019-09-27 DIAGNOSIS — Z79899 Other long term (current) drug therapy: Secondary | ICD-10-CM | POA: Insufficient documentation

## 2019-09-27 DIAGNOSIS — K5730 Diverticulosis of large intestine without perforation or abscess without bleeding: Secondary | ICD-10-CM | POA: Diagnosis not present

## 2019-09-27 DIAGNOSIS — I10 Essential (primary) hypertension: Secondary | ICD-10-CM | POA: Diagnosis not present

## 2019-09-27 LAB — COMPREHENSIVE METABOLIC PANEL
ALT: 5 U/L (ref 0–44)
AST: 21 U/L (ref 15–41)
Albumin: 4.3 g/dL (ref 3.5–5.0)
Alkaline Phosphatase: 79 U/L (ref 38–126)
Anion gap: 11 (ref 5–15)
BUN: 31 mg/dL — ABNORMAL HIGH (ref 8–23)
CO2: 25 mmol/L (ref 22–32)
Calcium: 9.4 mg/dL (ref 8.9–10.3)
Chloride: 99 mmol/L (ref 98–111)
Creatinine, Ser: 1.62 mg/dL — ABNORMAL HIGH (ref 0.44–1.00)
GFR calc Af Amer: 33 mL/min — ABNORMAL LOW (ref >60)
GFR calc non Af Amer: 28 mL/min — ABNORMAL LOW (ref >60)
Glucose, Bld: 108 mg/dL — ABNORMAL HIGH (ref 70–99)
Potassium: 3.5 mmol/L (ref 3.5–5.1)
Sodium: 135 mmol/L (ref 135–145)
Total Bilirubin: 1 mg/dL (ref 0.3–1.2)
Total Protein: 7.6 g/dL (ref 6.5–8.1)

## 2019-09-27 LAB — CBC
HCT: 41.5 % (ref 36.0–46.0)
Hemoglobin: 13.7 g/dL (ref 12.0–15.0)
MCH: 29.5 pg (ref 26.0–34.0)
MCHC: 33 g/dL (ref 30.0–36.0)
MCV: 89.4 fL (ref 80.0–100.0)
Platelets: 232 10*3/uL (ref 150–400)
RBC: 4.64 MIL/uL (ref 3.87–5.11)
RDW: 12.6 % (ref 11.5–15.5)
WBC: 9.5 10*3/uL (ref 4.0–10.5)
nRBC: 0 % (ref 0.0–0.2)

## 2019-09-27 LAB — URINALYSIS, ROUTINE W REFLEX MICROSCOPIC
Bilirubin Urine: NEGATIVE
Glucose, UA: NEGATIVE mg/dL
Hgb urine dipstick: NEGATIVE
Ketones, ur: NEGATIVE mg/dL
Nitrite: NEGATIVE
Protein, ur: NEGATIVE mg/dL
Specific Gravity, Urine: 1.005 (ref 1.005–1.030)
pH: 6 (ref 5.0–8.0)

## 2019-09-27 LAB — LIPASE, BLOOD: Lipase: 36 U/L (ref 11–51)

## 2019-09-27 LAB — LACTIC ACID, PLASMA: Lactic Acid, Venous: 1.1 mmol/L (ref 0.5–1.9)

## 2019-09-27 MED ORDER — AMOXICILLIN-POT CLAVULANATE 875-125 MG PO TABS: 1.0000 | ORAL_TABLET | Freq: Two times a day (BID) | ORAL | 0 refills | Status: AC

## 2019-09-27 MED ORDER — IOHEXOL 300 MG/ML  SOLN: 30.0000 mL | Freq: Once | INTRAMUSCULAR | Status: DC | PRN

## 2019-09-27 MED ORDER — AMOXICILLIN-POT CLAVULANATE 875-125 MG PO TABS
1.0000 | ORAL_TABLET | Freq: Once | ORAL | Status: AC
  Administered 2019-09-27: 19:00:00 1 via ORAL
  Filled 2019-09-27: qty 1

## 2019-09-27 MED ORDER — LIDOCAINE HCL URETHRAL/MUCOSAL 2 % EX GEL
CUTANEOUS | Status: AC
  Filled 2019-09-27: qty 11

## 2019-09-27 MED ORDER — SODIUM CHLORIDE 0.9 % IV BOLUS
1000.0000 mL | Freq: Once | INTRAVENOUS | Status: AC
  Administered 2019-09-27: 16:00:00 1000 mL via INTRAVENOUS

## 2019-09-27 MED ORDER — ONDANSETRON 4 MG PO TBDP: 4.0000 mg | ORAL_TABLET | Freq: Three times a day (TID) | ORAL | 0 refills | Status: DC | PRN

## 2019-09-27 MED ORDER — SODIUM CHLORIDE 0.9% FLUSH: 3.0000 mL | Freq: Once | INTRAVENOUS | Status: DC

## 2019-09-27 MED ORDER — AMOXICILLIN-POT CLAVULANATE 875-125 MG PO TABS: 1.0000 | ORAL_TABLET | Freq: Two times a day (BID) | ORAL | 0 refills | Status: DC

## 2019-09-27 MED ORDER — LIDOCAINE HCL URETHRAL/MUCOSAL 2 % EX GEL
1.0000 "application " | Freq: Once | CUTANEOUS | Status: AC
  Administered 2019-09-27: 1
  Filled 2019-09-27: qty 5

## 2019-09-27 MED ORDER — DOCUSATE SODIUM 100 MG PO CAPS: 100.0000 mg | ORAL_CAPSULE | Freq: Two times a day (BID) | ORAL | 0 refills | Status: DC

## 2019-09-27 NOTE — Telephone Encounter (Signed)
I agree with ER, unless pcp can see today.  I haven't seen pt in a year.  She also has no appt scheduled with me so if she wants to keep seeing me, she needs an appt in the future.

## 2019-09-27 NOTE — Telephone Encounter (Signed)
msg from after hours: Mother has PD and its been well managed. She is however having symptoms of vomiting, choking, nothing staying down. C/O difficulty with constipation. Stomach Bloated. LasBM  2 days ago. - Patient not wanting to go to ED but wants Dr. Doristine Devoid opinion. Thanks!

## 2019-09-27 NOTE — ED Triage Notes (Signed)
Patient c/o N/V x 4 days and has not had a normal BM in 3 days. Patient denies any abdominal pain.

## 2019-09-27 NOTE — Discharge Instructions (Addendum)
You were seen in the ER for constipation, nausea, and vomiting.  Continue pantoprazole daily  Urine culture pending  You should see a gastroenterology for re-evaluation and to ensure acid reflux and swallowing problems are well controlled.  This may also involve undergoing endoscopy and colonoscopy.  You should also follow-up with a general surgeon regarding the abnormalities noted on the CT scan.  A copy of this report has been included in your paperwork.  Nausea/vomiting: Use the ondansetron (generic for Zofran) for nausea or vomiting.  This medication may not prevent all vomiting or nausea, but can help facilitate better hydration. Things that can help with nausea/vomiting also include peppermint/menthol candies, vitamin B12, and ginger.  Constipation  You have evidence of constipation. Please follow the instructions below:  Hydration: You should start drinking at least ten, 8 oz glasses of water a day to help relieve your constipation. Baseline hydration should be at least eight, 8 oz glasses of water a day. This is a daily amount that you should be drinking, even without your current issue. Proper hydration not only helps prevent constipation, it is essential for any of the following treatments to be effective.  Fiber: Begin taking the fiber supplement daily. You should also increase the fiber in your diet.  Colace: This medication is a stool softener and can help facilitate regular bowel movements.  MiraLAX: You may begin taking MiraLAX daily until you are having at least 1 soft bowel movement a day.  Follow-up: Follow-up with your primary care provider as soon as possible for continued management of this issue.  Return: Return to the ED should any symptoms worsen.  For prescription assistance, may try using prescription discount sites or apps, such as goodrx.com

## 2019-09-27 NOTE — ED Provider Notes (Signed)
Taylor Mendoza is a 85 y.o. female, presenting to the ED with nausea and vomiting for the past 4 days.  Constipation with smaller, harder stool.  Denies any additional complaints.   HPI from [place], PA-C: "Taylor Mendoza is a 85 y.o. female with history of Parkinson's, hypertension, CAD, AVR presents ER for evaluation of nausea associated with vomiting and constipation for the last 4 days.  Reports every time she tries to eat food it will "come back up".  She is having nausea in between episodes of vomiting as well.  Can only drink water if she drinks through a straw otherwise will make her nauseated and vomit.  Daughter reports long history of swallowing problems, states frequently patient will have regurgitation in her throat and daughter unsure if she is truly vomiting or just having regurgitation. Her last normal bowel movement was 4 days ago.  She is having very small hard stools with minimal passing gas.  She feels like there is pressure in her rectum.  She denies any fever, chills, abdominal pain, dysuria.  No previous abdominal surgeries.  Gets occasional constipation that is easily resolved with MiraLAX as needed.  She takes a medicine for Parkinson's that she was told can cause constipation.  No interventions. No modifying factors."  Past Medical History:  Diagnosis Date  . Coronary artery disease   . Methicillin resistant Staphylococcus aureus in conditions classified elsewhere and of unspecified site   . MI (myocardial infarction) ([place])   . Other and unspecified hyperlipidemia   . Unspecified essential hypertension     Physical Exam  BP 136/63   Pulse (!) 57   Temp 98.6 F (37 C) (Oral)   Resp 15   Ht 5\' 6"  (1.676 m)   Wt 62.1 kg   SpO2 100%   BMI 22.11 kg/m   Physical Exam Vitals signs and nursing note reviewed.  Constitutional:      General: She is not in acute distress.    Appearance: She is well-developed. She is not diaphoretic.  HENT:     Head:  Normocephalic and atraumatic.     Mouth/Throat:     Mouth: Mucous membranes are moist.     Pharynx: Oropharynx is clear.  Eyes:     Conjunctiva/sclera: Conjunctivae normal.  Neck:     Musculoskeletal: Neck supple.  Cardiovascular:     Rate and Rhythm: Normal rate and regular rhythm.     Pulses: Normal pulses.          Radial pulses are 2+ on the right side and 2+ on the left side.       Posterior tibial pulses are 2+ on the right side and 2+ on the left side.     Heart sounds: Normal heart sounds.     Comments: Tactile temperature in the extremities appropriate and equal bilaterally. Pulmonary:     Effort: Pulmonary effort is normal. No respiratory distress.     Breath sounds: Normal breath sounds.  Abdominal:     General: Bowel sounds are increased.     Palpations: Abdomen is soft.     Tenderness: There is no abdominal tenderness. There is no guarding.  Musculoskeletal:     Right lower leg: No edema.     Left lower leg: No edema.  Lymphadenopathy:     Cervical: No cervical adenopathy.  Skin:    General: Skin is warm and dry.  Neurological:     Mental Status: She is alert.  Psychiatric:  Mood and Affect: Mood and affect normal.        Speech: Speech normal.        Behavior: Behavior normal.     ED Course/Procedures     Procedures   Abnormal Labs Reviewed  COMPREHENSIVE METABOLIC PANEL - Abnormal; Notable for the following components:      Result Value   Glucose, Bld 108 (*)    BUN 31 (*)    Creatinine, Ser 1.62 (*)    GFR calc non Af Amer 28 (*)    GFR calc Af Amer 33 (*)    All other components within normal limits  URINALYSIS, ROUTINE W REFLEX MICROSCOPIC - Abnormal; Notable for the following components:   APPearance HAZY (*)    Leukocytes,Ua TRACE (*)    Bacteria, UA MANY (*)    All other components within normal limits    Ct Abdomen Pelvis Wo Contrast  Result Date: 09/27/2019 CLINICAL DATA:  constipation x 4 days, nausea, vomiting. no fecal  impaction Oral contrast only Constipation constipation x 4 days, nausea, vomiting. no fecal impaction on exam EXAM: CT ABDOMEN AND PELVIS WITHOUT CONTRAST TECHNIQUE: Multidetector CT imaging of the abdomen and pelvis was performed following the standard protocol without IV contrast. COMPARISON:  None. FINDINGS: Lower chest: Lung bases are clear. Hepatobiliary: No focal hepatic lesion. No biliary duct dilatation. Gallbladder is normal. Common bile duct is normal. Pancreas: Pancreas is normal. No ductal dilatation. No pancreatic inflammation. Spleen: Normal spleen Adrenals/urinary tract: Adrenal glands normal. The LEFT kidney is smaller than RIGHT. Hydronephrosis or obstructive uropathy. Bladder normal Stomach/Bowel: Moderate size hiatal hernia. Stomach duodenum normal. Small bowel appears normal. Cecum is normal. Contrast travels through the ascending transverse colon. There several diverticula of the descending colon without acute inflammation. Contrast travels to the rectum. In the deep pelvis, inflammatory stranding in the mesorectal fat. There is a linear collection of gas within the LEFT wall of the rectum which extends inferiorly to the internal anal sphincter (image 76/2 and image 83/6). There is gas within the wall of the rectum extends over approximately 6 cm. Small focus of extraluminal gas on image 74/2 Vascular/Lymphatic: Abdominal aorta is normal caliber with atherosclerotic calcification. There is no retroperitoneal or periportal lymphadenopathy. No pelvic lymphadenopathy. Reproductive: Uterus and adnexa normal. Other: No free fluid. Musculoskeletal: No aggressive osseous lesion. IMPRESSION: 1. Linear gas within the wall of the distal rectum leading up to the internal anal sphincter. A small amount of extraluminal gas suggest micro perforation. Inflammation in the perirectal fat. Differential would include proctitis with intramural gas versus trauma related constipation versus ischemia. No frank abscess  in the pelvis. No intraperitoneal free air. 2. Moderate size hiatal hernia. Aortic Atherosclerosis (ICD10-I70.0). Electronically Signed   By: Suzy Bouchard M.D.   On: 09/27/2019 15:28    MDM   Clinical Course as of Sep 26 2022  Mon Sep 27, 2019  1453 Bacteria, UA(!): MANY [CG]  1454 Squamous Epithelial / LPF: 11-20 [CG]  1454 Re-evaluated patient.  Delay in CT.  No clinical decline. No emesis. Repeat abd exam unchanged from previous. Radiology tech at bedside taking patient for imaging now.    [CG]  1719 Spoke with Dr. Ninfa Linden, general surgeon. We reviewed the patient's CT scan findings.  He states this sort of finding is unlikely to be due to ischemia.  It may be due to her constipation or from the enema the patient performed prior to arrival. He recommends initiation of stool softener for regular bowel movements,  Augmentin for 10 days. There is not a surgical reason for admission, however, if patient is admitted for another reason general surgery will continue to consult on her. If she has discharged, patient may follow-up in the office within about a week. She will likely need a colonoscopy at some point.   [SJ]    Clinical Course User Index [CG] Kinnie Feil, PA-C [SJ] Lorayne Bender, PA-C   Patient care handoff report received from Carmon Sails, [place]. Plan: Awaiting CT of the abdomen/pelvis.  Patient presents with nausea, vomiting, and constipation. Patient is nontoxic appearing, afebrile, not tachycardic, not tachypneic, not hypotensive, maintains excellent SPO2 on room air, and is in no apparent distress.  No leukocytosis.  No lactic acidosis. Benign abdominal exam through multiple reassessments.  CT shows evidence of extraluminal air in the rectum.  No stool burden or signs of obstruction.  Per patient and Rosemarie Ax, no rectal tenderness and no stool burden during rectal exam. Patient had large, liquidy bowel movement after drinking oral contrast. Patient has a small  increase in her creatinine and BUN, may be associated with poor hydration. Patient tolerating oral fluids during ED course.  I discussed and recommended admission for this patient with the patient and her daughter at the bedside, at the very least for continued hydration and observation.  I also discussed the advantage of being able to get the patient in front of some of the specialists faster.  Patient quite adamant against admission.  She will need to follow-up with gastroenterology for EGD, colonoscopy, and swallow studies.  She will follow-up with general surgery for abnormal finding on CT in the rectum.  The patient was given instructions for home care as well as return precautions. Patient voices understanding of these instructions, accepts the plan, and is comfortable with discharge.   Findings and plan of care discussed with Theotis Burrow, MD.   Vitals:   09/27/19 0847 09/27/19 0849 09/27/19 1323  BP: 130/76  136/63  Pulse: 69  (!) 57  Resp: 14  15  Temp: 98.6 F (37 C)    TempSrc: Oral    SpO2: 95%  100%  Weight:  62.1 kg   Height:  5\' 6"  (1.676 m)    Vitals:   09/27/19 1630 09/27/19 1700 09/27/19 1920 09/27/19 1922  BP: 140/66 (!) 147/58 (!) 177/70 (!) 168/65  Pulse: (!) 47 (!) 56 66 60  Resp:  16 16   Temp:      TempSrc:      SpO2: 99% 100% 98% 97%  Weight:      Height:          Lorayne Bender, PA-C 09/27/19 2029    Little, Wenda Overland, MD 09/30/19 [phone]

## 2019-09-27 NOTE — Telephone Encounter (Signed)
Spoke with Taylor Mendoza patient is at E.D. right now.  Sent to front desk to make follow up appt.

## 2019-09-27 NOTE — ED Provider Notes (Signed)
[facility] DEPT Provider Note   CSN: [id number] Arrival date & time: 09/27/19  [id number]     History   Chief Complaint Chief Complaint  Patient presents with  . Emesis  . Constipation    HPI Taylor Mendoza is a 85 y.o. female with history of Parkinson's, hypertension, CAD, AVR presents ER for evaluation of nausea associated with vomiting and constipation for the last 4 days.  Reports every time she tries to eat food it will "come back up".  She is having nausea in between episodes of vomiting as well.  Can only drink water if she drinks through a straw otherwise will make her nauseated and vomit.  Daughter reports long history of swallowing problems, states frequently patient will have regurgitation in her throat and daughter unsure if she is truly vomiting or just having regurgitation. Her last normal bowel movement was 4 days ago.  She is having very small hard stools with minimal passing gas.  She feels like there is pressure in her rectum.  She denies any fever, chills, abdominal pain, dysuria.  No previous abdominal surgeries.  Gets occasional constipation that is easily resolved with MiraLAX as needed.  She takes a medicine for Parkinson's that she was told can cause constipation.  No interventions. No modifying factors.      HPI  Past Medical History:  Diagnosis Date  . Coronary artery disease   . Methicillin resistant Staphylococcus aureus in conditions classified elsewhere and of unspecified site   . MI (myocardial infarction) ([place])   . Other and unspecified hyperlipidemia   . Unspecified essential hypertension     Patient Active Problem List   Diagnosis Date Noted  . DIZZINESS 08/11/2009  . ACUT MI INFEROLAT WALL SUBSQT EPIS CARE 05/05/2009  . MRSA 04/03/2009  . HYPERLIPIDEMIA-MIXED 04/03/2009  . HYPERTENSION, UNSPECIFIED 04/03/2009  . CAD, NATIVE VESSEL 03/12/2009    Past Surgical History:  Procedure Laterality Date  . CATARACT  EXTRACTION, BILATERAL    . CORONARY ANGIOPLASTY WITH STENT PLACEMENT       OB History   No obstetric history on file.      Home Medications    Prior to Admission medications   Medication Sig Start Date End Date Taking? Authorizing Provider  acetaminophen (TYLENOL) 325 MG tablet Take 325 mg by mouth daily as needed for moderate pain or headache.    [provider]  aspirin 81 MG tablet Take 81 mg by mouth daily.      [provider]  atorvastatin (LIPITOR) 10 MG tablet Take 1 tablet by mouth daily. 04/25/12   [provider]  carbidopa-levodopa (SINEMET IR) 25-100 MG tablet TAKE 1 TABLET BY MOUTH 3 TIMES A DAY. 07/13/19   Tat, Eustace Quail, DO  cholecalciferol (VITAMIN D) 1000 UNITS tablet Take 1,000 Units by mouth daily as needed.     [provider]  levocetirizine (XYZAL) 5 MG tablet Take 1 tablet by mouth daily. 08/13/16   [provider]  losartan (COZAAR) 50 MG tablet Take 1 tablet by mouth daily. 05/02/13   [provider]  losartan-hydrochlorothiazide (HYZAAR) 50-12.5 MG tablet Take 1 tablet by mouth daily. 06/19/19   [provider]  metoprolol succinate (TOPROL-XL) 50 MG 24 hr tablet Take 1 tablet by mouth daily. 09/12/16   [provider]  nitroGLYCERIN (NITROSTAT) 0.4 MG SL tablet Place 1 tablet (0.4 mg total) under the tongue every 5 (five) minutes as needed for chest pain. 06/12/18 09/10/18  Ena Dawley  H, MD  pantoprazole (PROTONIX) 40 MG tablet Take 1 tablet by mouth daily. 05/07/12   [provider]  polyethylene glycol (MIRALAX / GLYCOLAX) packet Take 17 g by mouth daily as needed for mild constipation.     [provider]  triamcinolone cream (KENALOG) 0.1 % Apply 1 application topically 2 (two) times daily.    [provider]    Family History Family History  Problem Relation Age of Onset  . Osteoporosis Mother   . Skin cancer Mother   . Stroke Father   . Stroke Brother      Social History Social History   Tobacco Use  . Smoking status: Never Smoker  . Smokeless tobacco: Never Used  Substance Use Topics  . Alcohol use: No  . Drug use: No     Allergies   Patient has no known allergies.   Review of Systems Review of Systems  Gastrointestinal: Positive for constipation, nausea and vomiting.  All other systems reviewed and are negative.    Physical Exam Updated Vital Signs BP (!) 143/73   Pulse (!) 50   Temp 98.6 F (37 C) (Oral)   Resp 16   Ht 5\' 6"  (1.676 m)   Wt 62.1 kg   SpO2 99%   BMI 22.11 kg/m   Physical Exam Vitals signs and nursing note reviewed.  Constitutional:      Appearance: She is well-developed.     Comments: Non toxic in NAD  HENT:     Head: Normocephalic and atraumatic.     Nose: Nose normal.     Mouth/Throat:     Comments: MMM Eyes:     Conjunctiva/sclera: Conjunctivae normal.  Neck:     Musculoskeletal: Normal range of motion.  Cardiovascular:     Rate and Rhythm: Normal rate and regular rhythm.  Pulmonary:     Effort: Pulmonary effort is normal.     Breath sounds: Normal breath sounds.  Abdominal:     General: Bowel sounds are normal.     Palpations: Abdomen is soft.     Tenderness: There is no abdominal tenderness.     Comments: No G/R/R. No suprapubic or CVA tenderness. Negative Murphy's and McBurney's. Active BS to lower quadrants.   Genitourinary:    Comments:  DRE with RN at bedside. Small non tender hemorrhoid at around 9 o'clock.  No stool in rectal vault. Normal perianal skin. Good rectal tone Musculoskeletal: Normal range of motion.  Skin:    General: Skin is warm and dry.     Capillary Refill: Capillary refill takes less than 2 seconds.  Neurological:     Mental Status: She is alert.  Psychiatric:        Behavior: Behavior normal.      ED Treatments / Results  Labs (all labs ordered are listed, but only abnormal results are displayed) Labs Reviewed  COMPREHENSIVE METABOLIC PANEL -  Abnormal; Notable for the following components:      Result Value   Glucose, Bld 108 (*)    BUN 31 (*)    Creatinine, Ser 1.62 (*)    GFR calc non Af Amer 28 (*)    GFR calc Af Amer 33 (*)    All other components within normal limits  URINALYSIS, ROUTINE W REFLEX MICROSCOPIC - Abnormal; Notable for the following components:   APPearance HAZY (*)    Leukocytes,Ua TRACE (*)    Bacteria, UA MANY (*)    All other components within normal limits  URINE  CULTURE  LIPASE, BLOOD  CBC    EKG None  Radiology No results found.  Procedures Procedures (including critical care time)  Medications Ordered in ED Medications  sodium chloride flush (NS) 0.9 % injection 3 mL (has no administration in time range)  lidocaine (XYLOCAINE) 2 % jelly (has no administration in time range)  iohexol (OMNIPAQUE) 300 MG/ML solution 30 mL (has no administration in time range)  lidocaine (XYLOCAINE) 2 % jelly 1 application (1 application Other Given 09/27/19 1030)  sodium chloride 0.9 % bolus 1,000 mL (1,000 mLs Intravenous New Bag/Given 09/27/19 1530)     Initial Impression / Assessment and Plan / ED Course  I have reviewed the triage vital signs and the nursing notes.  Pertinent labs & imaging results that were available during my care of the patient were reviewed by me and considered in my medical decision making (see chart for details).  EMR reviewed.   85 yo F with nausea, vomiting, constipation.   Exam benign, no abd tenderness. DRE essential normal without signs to suggest impaction.   DDx includes drug induced constipation vs obstruction.   Will obtain labs, reassess. Consider CTAP.    1455: ER work up reviewed by me.  Creatinine 1.62/GFR 28, unknown baseline last creatinine 1.3 in 2018. Unable to do IV contrast with CTAP, oral contrast tolerated.  Will give 1 L IVF, suspect dehydratin/pre renal elevated in creatinine.  I do not think this warrants admission as I suspect is mostly  dehydration.  UA with trace leuks, 6-10 WBC and many bacteria however poor sample with sq epithileal. She has no UTI symptoms, fevers, suprapubic or CVAT.  Will send for culture and defer abx here today.  1550: Pt re-evaluated. Reports feeling bloated and attributes it to oral contrast.  She had large watery BM here after CT and declines enema.  No abd tenderness. Repeat abd exam unchanged, non tender no distention.    Patient will be handed off to oncoming EDPA who will f/u on CTAP.  Anticipate discharge with miralax daily and GI follow up for chronic dysphagia.  Hand off at shift change explained to patient. She is comfortable with plan.  Final Clinical Impressions(s) / ED Diagnoses   Final diagnoses:  Drug-induced constipation    ED Discharge Orders    None       Kinnie Feil, PA-C 09/27/19 1557    Sherwood Gambler, MD 09/28/19 [phone]

## 2019-09-29 DIAGNOSIS — R112 Nausea with vomiting, unspecified: Secondary | ICD-10-CM | POA: Diagnosis not present

## 2019-09-29 DIAGNOSIS — K5900 Constipation, unspecified: Secondary | ICD-10-CM | POA: Diagnosis not present

## 2019-09-29 DIAGNOSIS — R1310 Dysphagia, unspecified: Secondary | ICD-10-CM | POA: Diagnosis not present

## 2019-09-29 DIAGNOSIS — K219 Gastro-esophageal reflux disease without esophagitis: Secondary | ICD-10-CM | POA: Diagnosis not present

## 2019-09-30 ENCOUNTER — Other Ambulatory Visit: Payer: Self-pay | Admitting: Gastroenterology

## 2019-09-30 DIAGNOSIS — R9389 Abnormal findings on diagnostic imaging of other specified body structures: Secondary | ICD-10-CM | POA: Diagnosis not present

## 2019-09-30 DIAGNOSIS — R1314 Dysphagia, pharyngoesophageal phase: Secondary | ICD-10-CM | POA: Diagnosis not present

## 2019-09-30 DIAGNOSIS — R112 Nausea with vomiting, unspecified: Secondary | ICD-10-CM | POA: Diagnosis not present

## 2019-09-30 LAB — URINE CULTURE: Culture: >=100000 — AB

## 2019-10-01 ENCOUNTER — Telehealth: Payer: Self-pay

## 2019-10-01 NOTE — Telephone Encounter (Signed)
Post ED Visit - Positive Culture Follow-up  Culture report reviewed by antimicrobial stewardship pharmacist: [facility] Team []  Elenor Quinones, Pharm.D. []  Heide Guile, Pharm.D., BCPS AQ-ID []  Parks Neptune, Pharm.D., BCPS []  Alycia Rossetti, Pharm.D., BCPS []  [place], Pharm.D., BCPS, AAHIVP []  Legrand Como, Pharm.D., BCPS, AAHIVP []  Salome Arnt, PharmD, BCPS []  Johnnette Gourd, PharmD, BCPS []  Hughes Better, PharmD, BCPS []  Leeroy Cha, PharmD []  Laqueta Linden, PharmD, BCPS []  Albertina Parr, PharmD  [facility] Team [x]  Leodis Sias, PharmD []  Lindell Spar, PharmD []  Royetta Asal, PharmD []  Graylin Shiver, Rph []  Rema Fendt) Glennon Mac, PharmD []  Arlyn Dunning, PharmD []  Netta Cedars, PharmD []  Dia Sitter, PharmD []  Leone Haven, PharmD []  Gretta Arab, PharmD []  Theodis Shove, PharmD []  Peggyann Juba, PharmD []  Reuel Boom, PharmD   Positive urine culture Treated with Amoxicillin, organism sensitive to the same and no further patient follow-up is required at this time.  Genia Del 10/01/2019, 9:48 AM

## 2019-10-04 NOTE — Progress Notes (Signed)
Virtual Visit via Video Note The purpose of this virtual visit is to provide medical care while limiting exposure to the novel coronavirus.    Consent was obtained for video visit:  Yes.   Answered questions that patient had about telehealth interaction:  Yes.   I discussed the limitations, risks, security and privacy concerns of performing an evaluation and management service by telemedicine. I also discussed with the patient that there may be a patient responsible charge related to this service. The patient expressed understanding and agreed to proceed.  Pt location: Home Physician Location: office Name of referring provider:  [name], MD I connected with Nell Range at patients initiation/request on 10/06/2019 at  8:15 AM EDT by video enabled telemedicine application and verified that I am speaking with the correct person using two identifiers. Pt MRN:  [id number] Pt DOB:  [birth year] Video Participants:  Nell Range;     History of Present Illness:  Patient is seen today in follow-up for Parkinson's disease.  I have not seen the patient in a year.  When I last saw her, she was on carbidopa/levodopa 25/100, 1 tablet 3 times per day.  She reports that she is still on this medication.  Pt denies falls.  Pt denies lightheadedness, near syncope.  No hallucinations.  Mood has been good.  Was seen on September 27, 2019 in the emergency room for nausea and vomiting related to constipation.  Patient reported to the emergency room that her Parkinson's medication was the cause of her constipation (of note is that that had not changed in over a year).  She asks me about this today.  She does state that the ER told her the nausea was from the hernia and she has EGD on 11/2.    Her creatinine was elevated at 1.62.  She had a CT of the abdomen which demonstrated a small amount of extraluminal gas to suggest micro perforation.  There is inflammation in the perirectal fat, the differential which  included proctitis versus trauma versus constipation versus ischemia.  There was a moderate sized hiatal hernia.  Turns out that her urine culture was also positive for greater than 100,000 E. coli.  They did call her back and treated her.  She states that she didn't know that she had a UTI but is on abx.  The nausea has only been going on for a week.  She is given zofran for the nausea.  She is only on liquid diet because of the nausea.  She does think that she is walking slower.   Current Outpatient Medications on File Prior to Visit  Medication Sig Dispense Refill   acetaminophen (TYLENOL) 325 MG tablet Take 325 mg by mouth daily as needed for moderate pain or headache.     amoxicillin-clavulanate (AUGMENTIN) 875-125 MG tablet Take 1 tablet by mouth every 12 (twelve) hours for 10 days. 20 tablet 0   aspirin 81 MG tablet Take 81 mg by mouth daily.       atorvastatin (LIPITOR) 10 MG tablet Take 1 tablet by mouth daily.     carbidopa-levodopa (SINEMET IR) 25-100 MG tablet TAKE 1 TABLET BY MOUTH 3 TIMES A DAY. 270 tablet 0   cholecalciferol (VITAMIN D) 1000 UNITS tablet Take 1,000 Units by mouth daily as needed.      docusate sodium (COLACE) 100 MG capsule Take 1 capsule (100 mg total) by mouth every 12 (twelve) hours. 60 capsule 0   levocetirizine (XYZAL) 5 MG tablet  Take 1 tablet by mouth daily.  3   losartan (COZAAR) 50 MG tablet Take 1 tablet by mouth daily.     losartan-hydrochlorothiazide (HYZAAR) 50-12.5 MG tablet Take 1 tablet by mouth daily.     metoprolol succinate (TOPROL-XL) 50 MG 24 hr tablet Take 1 tablet by mouth daily.  11   nitroGLYCERIN (NITROSTAT) 0.4 MG SL tablet Place 1 tablet (0.4 mg total) under the tongue every 5 (five) minutes as needed for chest pain. 30 tablet 3   ondansetron (ZOFRAN ODT) 4 MG disintegrating tablet Take 1 tablet (4 mg total) by mouth every 8 (eight) hours as needed for nausea or vomiting. 20 tablet 0   pantoprazole (PROTONIX) 40 MG tablet Take  1 tablet by mouth daily.     polyethylene glycol (MIRALAX / GLYCOLAX) packet Take 17 g by mouth daily as needed for mild constipation.      triamcinolone cream (KENALOG) 0.1 % Apply 1 application topically 2 (two) times daily.     No current facility-administered medications on file prior to visit.      Observations/Objective:   Vitals:   10/06/19 0745  Weight: 136 lb (61.7 kg)  Height: 5\' 9"  (1.753 m)   GEN:  The patient appears stated age and is in NAD.  Neurological examination:  Orientation: The patient is alert and oriented x3. Cranial nerves: There is good facial symmetry. There is minimal facial hypomimia.  The speech is fluent and clear. Soft palate rises symmetrically and there is no tongue deviation. Hearing is intact to conversational tone. Motor: Strength is at least antigravity x 4.   Shoulder shrug is equal and symmetric.  There is no pronator drift.  Movement examination: Tone: unable Abnormal movements: None noted, but I was not really able to see her hands at rest and she was holding the phone. Coordination:  There is no decremation with hand opening and closing her finger taps bilaterally.  I was not able to see the feet. Gait and Station: The patient was not able to position the phone to have me watch her ambulate.    Assessment and Plan:   1.  Idiopathic Parkinson's disease.  The patient has tremor, bradykinesia, rigidity and postural instability.             -We discussed the diagnosis as well as pathophysiology of the disease.  We discussed treatment options as well as prognostic indicators.  Patient education was provided.             -We discussed that it used to be thought that levodopa would increase risk of melanoma but now it is believed that Parkinsons itself likely increases risk of melanoma. she is to get regular skin checks.             -continue carbidopa/levodopa 25/100 tid.  She does feel that she is walking slower, but I did not want to change  her medication right now given that she is having a lot of nausea.  I do not think the nausea is related to her levodopa.             -talked to patient about using walker at all times.  She is resistant.  Talked about hip fx's/potential consequences, etc             -Patient is feeling deconditioned.  This is probably from the nausea.  Offered physical therapy.  She declined.  2.    History of dysphagia, currently doing fine in this regard             -  She had a MBE on 08/07/17 and was overall unremarkable.  There was one episode of flash penetration of thin liquids when consumed in conjunction with a pill, which is considered normal for age.  Regular diet with thin liquids was recommended.  3.   Nausea   -Do not think that this is at all related to levodopa.  She has been on levodopa for over a year, without issue exam without medication dosage change.  Nausea has only been for about a week.  Her CT of the abdomen was abnormal.  In addition, she currently has a urinary tract infection which is being treated.  She has a follow-up appointment here in the near future with gastroenterology and sounds like she is going to be having a scope.  She is unable to tolerate a liquid diet in the meantime, and is on Zofran.    Follow Up Instructions:  will get her a f/u appt in the office after she has seen GI. -I discussed the assessment and treatment plan with the patient. The patient was provided an opportunity to ask questions and all were answered. The patient agreed with the plan and demonstrated an understanding of the instructions.   The patient was advised to call back or seek an in-person evaluation if the symptoms worsen or if the condition fails to improve as anticipated.    Total Time spent in visit with the patient was:  20 min, of which more than 50% of the time was spent in counseling.   Pt understands and agrees with the plan of care outlined.     Alonza Bogus, DO

## 2019-10-06 ENCOUNTER — Encounter: Payer: Self-pay | Admitting: Neurology

## 2019-10-06 ENCOUNTER — Other Ambulatory Visit: Payer: Self-pay

## 2019-10-06 ENCOUNTER — Telehealth (INDEPENDENT_AMBULATORY_CARE_PROVIDER_SITE_OTHER): Payer: Medicare Other | Admitting: Neurology

## 2019-10-06 VITALS — Ht 69.0 in | Wt 136.0 lb

## 2019-10-06 DIAGNOSIS — R110 Nausea: Secondary | ICD-10-CM

## 2019-10-06 DIAGNOSIS — N390 Urinary tract infection, site not specified: Secondary | ICD-10-CM | POA: Diagnosis not present

## 2019-10-06 DIAGNOSIS — G20 Parkinson's disease: Secondary | ICD-10-CM | POA: Diagnosis not present

## 2019-10-06 MED ORDER — CARBIDOPA-LEVODOPA 25-100 MG PO TABS: 1.0000 | ORAL_TABLET | Freq: Three times a day (TID) | ORAL | 1 refills | Status: DC

## 2019-10-06 NOTE — Addendum Note (Signed)
Addended by: Ludwig Clarks on: 10/06/2019 08:36 AM   Modules accepted: Orders

## 2019-10-11 ENCOUNTER — Ambulatory Visit
Admission: RE | Admit: 2019-10-11 | Discharge: 2019-10-11 | Disposition: A | Discharge status: Home / Self Care | Payer: Medicare Other | Source: Ambulatory Visit | Attending: Gastroenterology | Admitting: Gastroenterology

## 2019-10-11 DIAGNOSIS — R1314 Dysphagia, pharyngoesophageal phase: Secondary | ICD-10-CM

## 2019-10-11 DIAGNOSIS — K219 Gastro-esophageal reflux disease without esophagitis: Secondary | ICD-10-CM | POA: Diagnosis not present

## 2019-10-11 DIAGNOSIS — K449 Diaphragmatic hernia without obstruction or gangrene: Secondary | ICD-10-CM | POA: Diagnosis not present

## 2019-10-14 NOTE — Progress Notes (Signed)
[facility] was seen today in follow up for Parkinsons disease.  I saw her only about a week ago through telemedicine.  Her biggest issue at that point in time was nausea, which I felt was unrelated to levodopa, because I had not changed her levodopa in over a year, and she had only had nausea for about a week.  She had also had an abnormal CT of the abdomen.  She has since seen gastroenterology at [facility].  I don't have any of those records.  she had a barium esophagram on November 2 that demonstrated mild to moderate presbyesophagus and a small to moderate hiatal hernia with associated moderate reflux.  She reports that she was given an abx and she feels good today but was nauseated yesterday but overall is better.  She still is mostly on a liquid diet.  She states that she has actually not seen the doctor yet.  She has only gone for the test.  She has a f/u on 11/16.  She is having runny stools.  May be related to abx.  Pt denies falls.  Pt denies lightheadedness, near syncope.  No hallucinations.    Current prescribed movement disorder medications: Carbidopa/levodopa 25/100, 1 tablet 3 times per day   ALLERGIES:  No Known Allergies  CURRENT MEDICATIONS:  Outpatient Encounter Medications as of 10/15/2019  Medication Sig  . acetaminophen (TYLENOL) 325 MG tablet Take 325 mg by mouth daily as needed for moderate pain or headache.  Marland Kitchen aspirin 81 MG tablet Take 81 mg by mouth daily.    Marland Kitchen atorvastatin (LIPITOR) 10 MG tablet Take 1 tablet by mouth daily.  . carbidopa-levodopa (SINEMET IR) 25-100 MG tablet Take 1 tablet by mouth 3 (three) times daily.  . cholecalciferol (VITAMIN D) 1000 UNITS tablet Take 1,000 Units by mouth daily as needed.   . docusate sodium (COLACE) 100 MG capsule Take 1 capsule (100 mg total) by mouth every 12 (twelve) hours.  Marland Kitchen levocetirizine (XYZAL) 5 MG tablet Take 1 tablet by mouth daily.  Marland Kitchen losartan (COZAAR) 50 MG tablet Take 1 tablet by mouth daily.  Marland Kitchen  losartan-hydrochlorothiazide (HYZAAR) 50-12.5 MG tablet Take 1 tablet by mouth daily.  . metoprolol succinate (TOPROL-XL) 50 MG 24 hr tablet Take 1 tablet by mouth daily.  . ondansetron (ZOFRAN ODT) 4 MG disintegrating tablet Take 1 tablet (4 mg total) by mouth every 8 (eight) hours as needed for nausea or vomiting.  . pantoprazole (PROTONIX) 40 MG tablet Take 1 tablet by mouth daily.  . polyethylene glycol (MIRALAX / GLYCOLAX) packet Take 17 g by mouth daily as needed for mild constipation.   . triamcinolone cream (KENALOG) 0.1 % Apply 1 application topically 2 (two) times daily.  . nitroGLYCERIN (NITROSTAT) 0.4 MG SL tablet Place 1 tablet (0.4 mg total) under the tongue every 5 (five) minutes as needed for chest pain.   No facility-administered encounter medications on file as of 10/15/2019.     PAST MEDICAL HISTORY:   Past Medical History:  Diagnosis Date  . Coronary artery disease   . Methicillin resistant Staphylococcus aureus in conditions classified elsewhere and of unspecified site   . MI (myocardial infarction) ([place])   . Other and unspecified hyperlipidemia   . Unspecified essential hypertension     PAST SURGICAL HISTORY:   Past Surgical History:  Procedure Laterality Date  . CATARACT EXTRACTION, BILATERAL    . CORONARY ANGIOPLASTY WITH STENT PLACEMENT      SOCIAL HISTORY:   Social  History   Socioeconomic History  . Marital status: Married    Spouse name: Not on file  . Number of children: Not on file  . Years of education: Not on file  . Highest education level: Not on file  Occupational History  . Not on file  Social Needs  . Financial resource strain: Not on file  . Food insecurity    Worry: Not on file    Inability: Not on file  . Transportation needs    Medical: Not on file    Non-medical: Not on file  Tobacco Use  . Smoking status: Never Smoker  . Smokeless tobacco: Never Used  Substance and Sexual Activity  . Alcohol use: No  . Drug use: No  .  Sexual activity: Not on file  Lifestyle  . Physical activity    Days per week: Not on file    Minutes per session: Not on file  . Stress: Not on file  Relationships  . Social Herbalist on phone: Not on file    Gets together: Not on file    Attends religious service: Not on file    Active member of club or organization: Not on file    Attends meetings of clubs or organizations: Not on file    Relationship status: Not on file  . Intimate partner violence    Fear of current or ex partner: Not on file    Emotionally abused: Not on file    Physically abused: Not on file    Forced sexual activity: Not on file  Other Topics Concern  . Not on file  Social History Narrative   Right handed    [facility]    FAMILY HISTORY:   Family Status  Relation Name Status  . Mother  Deceased  . Father  Deceased  . Sister 3 Alive  . Brother  Alive  . Daughter 2 Alive  . Son  Alive    ROS:  Review of Systems  Constitutional: Positive for malaise/fatigue.  HENT: Negative.   Eyes: Negative.   Respiratory: Positive for shortness of breath (chronic).   Gastrointestinal: Positive for nausea.  Genitourinary: Negative.   Musculoskeletal: Negative.   Neurological: Positive for tingling (feet).    PHYSICAL EXAMINATION:    VITALS:   Vitals:   10/15/19 1006  BP: (!) 151/75  Pulse: 68  Resp: 16  Temp: 97.8 F (36.6 C)  SpO2: 99%  Weight: 138 lb (62.6 kg)  Height: 5\' 5"  (1.651 m)   Wt Readings from Last 3 Encounters:  10/15/19 138 lb (62.6 kg)  10/06/19 136 lb (61.7 kg)  09/27/19 137 lb (62.1 kg)     GEN:  The patient appears stated age and is in NAD. HEENT:  Normocephalic, atraumatic.  The mucous membranes are moist. The superficial temporal arteries are without ropiness or tenderness. CV:  RRR Lungs:  CTAB Neck/HEME:  There are no carotid bruits bilaterally.  Neurological examination:  Orientation: The patient is alert and oriented x3. Cranial nerves: There is  good facial symmetry with mild facial hypomimia. The speech is fluent and clear. Soft palate rises symmetrically and there is no tongue deviation. Hearing is intact to conversational tone. Sensation: Sensation is intact to light touch throughout Motor: Strength is at least antigravity x4.  Movement examination: Tone: There is normal tone in the bilateral ue Abnormal movements: mild tremor in the LUE Coordination:  There is no decremation with RAM's, with any form of RAMS, including  alternating supination and pronation of the forearm, hand opening and closing, finger taps, heel taps and toe taps. Gait and Station: The patient has difficulty arising out of a deep-seated chair without the use of the hands.  She makes 3 unsuccessful attempts and then pushes off of the chair to arise.  The patient's stride length is decreased and she is unbalanced.     ASSESSMENT/PLAN:  1. Idiopathic Parkinson's disease. The patient has tremor, bradykinesia, rigidity and postural instability. -We discussed the diagnosis as well as pathophysiology of the disease. We discussed treatment options as well as prognostic indicators. Patient education was provided. -We discussed that it used to be thought that levodopa would increase risk of melanoma but now it is believed that Parkinsons itself likely increases risk of melanoma. sheis to get regular skin checks. -continue carbidopa/levodopa 25/100 tid.  with the exception of balance (not good), she looks well treated with med. -Patient is much more off balanced today.  She declines PT.  She was encouraged to use walker at all times.  States husband with dementia. Told her that if she doesn't take care of herself she won't be able to take care of him.  2.   History of dysphagia, currently doing fine in this regard -She had a MBE on 08/07/17 and was overall unremarkable. There was one episode of flash  penetration of thin liquids when consumed in conjunction with a pill, which is considered normal for age. Regular diet with thin liquids was recommended.  3.   Nausea              -unrelated to levodopa.  Been on same dose of carbidopa/levodopa 25/100 for over a year and nausea just started.  Sounds like may have had H pylori.  Feeling a bit better.  Has f/u with [facility] in 10 days.  4.  Follow up is anticipated in the next 4-6 months, sooner should new neurologic issues arise.  Much greater than 50% of this visit was spent in counseling and coordinating care.  Total face to face time:  25 min  Cc:  [name], MD

## 2019-10-15 ENCOUNTER — Ambulatory Visit: Payer: Medicare Other | Admitting: Neurology

## 2019-10-15 ENCOUNTER — Encounter: Payer: Self-pay | Admitting: Neurology

## 2019-10-15 ENCOUNTER — Other Ambulatory Visit: Payer: Self-pay

## 2019-10-15 ENCOUNTER — Telehealth: Payer: Self-pay | Admitting: Neurology

## 2019-10-15 VITALS — BP 151/75 | HR 68 | Temp 97.8°F | Resp 16 | Ht 65.0 in | Wt 138.0 lb

## 2019-10-15 DIAGNOSIS — R110 Nausea: Secondary | ICD-10-CM | POA: Diagnosis not present

## 2019-10-15 DIAGNOSIS — G20 Parkinson's disease: Secondary | ICD-10-CM | POA: Diagnosis not present

## 2019-10-15 MED ORDER — CARBIDOPA-LEVODOPA 25-100 MG PO TABS: 1.0000 | ORAL_TABLET | Freq: Three times a day (TID) | ORAL | 1 refills | Status: DC

## 2019-10-15 NOTE — Telephone Encounter (Signed)
Okay.  The forms are up above you, Loma Sousa

## 2019-10-15 NOTE — Telephone Encounter (Signed)
Placed in to be signed folder 

## 2019-10-15 NOTE — Patient Instructions (Signed)
Use your walker at all times.  Let me know if you change your mind with physical therapy and we can send them to the home.  The physicians and staff at [facility] are committed to providing excellent care. You may receive a survey requesting feedback about your experience at our office. We strive to receive "very good" responses to the survey questions. If you feel that your experience would prevent you from giving the office a "very good " response, please contact our office to try to remedy the situation. We may be reached at [phone]. Thank you for taking the time out of your busy day to complete the survey.

## 2019-10-15 NOTE — Telephone Encounter (Signed)
Please advise 

## 2019-10-15 NOTE — Telephone Encounter (Signed)
Patient called requesting help with getting a handicap placard for use when driving. She was seen this morning by Dr. Carles Collet.

## 2019-10-19 DIAGNOSIS — L821 Other seborrheic keratosis: Secondary | ICD-10-CM | POA: Diagnosis not present

## 2019-10-19 DIAGNOSIS — Z85828 Personal history of other malignant neoplasm of skin: Secondary | ICD-10-CM | POA: Diagnosis not present

## 2019-10-19 DIAGNOSIS — L820 Inflamed seborrheic keratosis: Secondary | ICD-10-CM | POA: Diagnosis not present

## 2019-10-19 DIAGNOSIS — L570 Actinic keratosis: Secondary | ICD-10-CM | POA: Diagnosis not present

## 2019-10-26 DIAGNOSIS — I252 Old myocardial infarction: Secondary | ICD-10-CM | POA: Diagnosis not present

## 2019-10-26 DIAGNOSIS — N183 Chronic kidney disease, stage 3 unspecified: Secondary | ICD-10-CM | POA: Diagnosis not present

## 2019-10-26 DIAGNOSIS — M179 Osteoarthritis of knee, unspecified: Secondary | ICD-10-CM | POA: Diagnosis not present

## 2019-10-26 DIAGNOSIS — I2510 Atherosclerotic heart disease of native coronary artery without angina pectoris: Secondary | ICD-10-CM | POA: Diagnosis not present

## 2019-10-28 DIAGNOSIS — K219 Gastro-esophageal reflux disease without esophagitis: Secondary | ICD-10-CM | POA: Diagnosis not present

## 2019-10-28 DIAGNOSIS — K228 Other specified diseases of esophagus: Secondary | ICD-10-CM | POA: Diagnosis not present

## 2019-12-31 DIAGNOSIS — N183 Chronic kidney disease, stage 3 unspecified: Secondary | ICD-10-CM | POA: Diagnosis not present

## 2019-12-31 DIAGNOSIS — I252 Old myocardial infarction: Secondary | ICD-10-CM | POA: Diagnosis not present

## 2019-12-31 DIAGNOSIS — M152 Bouchard's nodes (with arthropathy): Secondary | ICD-10-CM | POA: Diagnosis not present

## 2019-12-31 DIAGNOSIS — I129 Hypertensive chronic kidney disease with stage 1 through stage 4 chronic kidney disease, or unspecified chronic kidney disease: Secondary | ICD-10-CM | POA: Diagnosis not present

## 2019-12-31 DIAGNOSIS — M179 Osteoarthritis of knee, unspecified: Secondary | ICD-10-CM | POA: Diagnosis not present

## 2019-12-31 DIAGNOSIS — E7800 Pure hypercholesterolemia, unspecified: Secondary | ICD-10-CM | POA: Diagnosis not present

## 2019-12-31 DIAGNOSIS — M810 Age-related osteoporosis without current pathological fracture: Secondary | ICD-10-CM | POA: Diagnosis not present

## 2019-12-31 DIAGNOSIS — M19079 Primary osteoarthritis, unspecified ankle and foot: Secondary | ICD-10-CM | POA: Diagnosis not present

## 2019-12-31 DIAGNOSIS — I2510 Atherosclerotic heart disease of native coronary artery without angina pectoris: Secondary | ICD-10-CM | POA: Diagnosis not present

## 2020-03-14 NOTE — Progress Notes (Signed)
Assessment/Plan:   1.  Parkinsons Disease  -Continue carbidopa/levodopa 25/100, 1 tablet 3 times per day.  -exercise  -encouraged online support groups!  -think that home PT could be of value.  She declined.  [facility] so therapy should be easy but she doesn't want that right now.  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  Knee pain  -asked if associated with Parkinsons Disease.  Told her not and likely arthritic and told to f/u with [name], MD      Subjective:   [facility] was seen today in follow up for Parkinsons disease.  My previous records were reviewed prior to todays visit as well as outside records available to me. Pt denies falls.  She uses a walker some.  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  Pt denies lightheadedness, near syncope.  No hallucinations.  Mood has been good.  Last visit, she was complaining about a lot of nausea, but I did not think that it was related to her levodopa, and it sounded like she may have had H. pylori.  She was to follow-up with [facility], but I do not have any of those records.  She reports today that she is doing better now and it was just all due to GERD - "I almost forgot about that."    Current prescribed movement disorder medications: Carbidopa/levodopa 25/100, 1 tablet 3 times per day    ALLERGIES:  No Known Allergies  CURRENT MEDICATIONS:  Outpatient Encounter Medications as of 03/16/2020  Medication Sig  . acetaminophen (TYLENOL) 325 MG tablet Take 325 mg by mouth daily as needed for moderate pain or headache.  Marland Kitchen aspirin 81 MG tablet Take 81 mg by mouth daily.    Marland Kitchen atorvastatin (LIPITOR) 10 MG tablet Take 1 tablet by mouth daily.  . carbidopa-levodopa (SINEMET IR) 25-100 MG tablet Take 1 tablet by mouth 3 (three) times daily.  . cholecalciferol (VITAMIN D) 1000 UNITS tablet  Take 1,000 Units by mouth daily as needed.   . docusate sodium (COLACE) 100 MG capsule Take 1 capsule (100 mg total) by mouth every 12 (twelve) hours.  Marland Kitchen levocetirizine (XYZAL) 5 MG tablet Take 1 tablet by mouth daily.  Marland Kitchen losartan (COZAAR) 50 MG tablet Take 1 tablet by mouth daily.  Marland Kitchen losartan-hydrochlorothiazide (HYZAAR) 50-12.5 MG tablet Take 1 tablet by mouth daily.  . metoprolol succinate (TOPROL-XL) 50 MG 24 hr tablet Take 1 tablet by mouth daily.  . ondansetron (ZOFRAN ODT) 4 MG disintegrating tablet Take 1 tablet (4 mg total) by mouth every 8 (eight) hours as needed for nausea or vomiting.  . pantoprazole (PROTONIX) 40 MG tablet Take 1 tablet by mouth daily.  . polyethylene glycol (MIRALAX / GLYCOLAX) packet Take 17 g by mouth daily as needed for mild constipation.   . triamcinolone cream (KENALOG) 0.1 % Apply 1 application topically 2 (two) times daily.  . nitroGLYCERIN (NITROSTAT) 0.4 MG SL tablet Place 1 tablet (0.4 mg total) under the tongue every 5 (five) minutes as needed for chest pain.   No facility-administered encounter medications on file as of 03/16/2020.    Objective:   PHYSICAL EXAMINATION:    VITALS:   Vitals:   03/16/20 1452  BP: (!) 157/73  Pulse: 66  SpO2: 98%  Weight: 139 lb (63  kg)  Height: 5\' 5"  (1.651 m)    GEN:  The patient appears stated age and is in NAD. HEENT:  Normocephalic, atraumatic.  The mucous membranes are moist. The superficial temporal arteries are without ropiness or tenderness. CV:  RRR Lungs:  CTAB Neck/HEME:  There are no carotid bruits bilaterally.  Neurological examination:  Orientation: The patient is alert and oriented x3. Cranial nerves: There is good facial symmetry with mild facial hypomimia. The speech is fluent and clear. Soft palate rises symmetrically and there is no tongue deviation. Hearing is intact to conversational tone. Sensation: Sensation is intact to light touch throughout Motor: Strength is at least antigravity  x4.  Movement examination: Tone: There is normal tone in the UE/LE Abnormal movements: there is mild thumb rest tremor on the right Coordination:  There is mild decremation with RAM's, with toe taps bilaterally Gait and Station: The patient pushes off of the chair to arise.  She is unbalanced   Total time spent on today's visit was 30 minutes, including both face-to-face time and nonface-to-face time.  Time included that spent on review of records (prior notes available to me/labs/imaging if pertinent), discussing treatment and goals, answering patient's questions and coordinating care.  Cc:  [name], MD

## 2020-03-16 ENCOUNTER — Other Ambulatory Visit: Payer: Self-pay

## 2020-03-16 ENCOUNTER — Ambulatory Visit (INDEPENDENT_AMBULATORY_CARE_PROVIDER_SITE_OTHER): Payer: PPO | Admitting: Neurology

## 2020-03-16 ENCOUNTER — Encounter: Payer: Self-pay | Admitting: Neurology

## 2020-03-16 VITALS — BP 157/73 | HR 66 | Ht 65.0 in | Wt 139.0 lb

## 2020-03-16 DIAGNOSIS — M25562 Pain in left knee: Secondary | ICD-10-CM | POA: Diagnosis not present

## 2020-03-16 DIAGNOSIS — G20A1 Parkinson's disease without dyskinesia, without mention of fluctuations: Secondary | ICD-10-CM

## 2020-03-16 DIAGNOSIS — G20 Parkinson's disease: Secondary | ICD-10-CM

## 2020-03-16 DIAGNOSIS — M25561 Pain in right knee: Secondary | ICD-10-CM

## 2020-03-16 NOTE — Patient Instructions (Signed)
Let me know if you change your mind on physical therapy  The physicians and staff at [facility] are committed to providing excellent care. You may receive a survey requesting feedback about your experience at our office. We strive to receive "very good" responses to the survey questions. If you feel that your experience would prevent you from giving the office a "very good " response, please contact our office to try to remedy the situation. We may be reached at [phone]. Thank you for taking the time out of your busy day to complete the survey.

## 2020-04-01 DIAGNOSIS — S52502A Unspecified fracture of the lower end of left radius, initial encounter for closed fracture: Secondary | ICD-10-CM | POA: Diagnosis not present

## 2020-04-03 DIAGNOSIS — S52502D Unspecified fracture of the lower end of left radius, subsequent encounter for closed fracture with routine healing: Secondary | ICD-10-CM | POA: Diagnosis not present

## 2020-04-05 DIAGNOSIS — S52502D Unspecified fracture of the lower end of left radius, subsequent encounter for closed fracture with routine healing: Secondary | ICD-10-CM | POA: Diagnosis not present

## 2020-04-05 DIAGNOSIS — M545 Low back pain: Secondary | ICD-10-CM | POA: Diagnosis not present

## 2020-05-02 ENCOUNTER — Encounter: Payer: Self-pay | Admitting: Cardiovascular Disease

## 2020-05-03 DIAGNOSIS — S52502A Unspecified fracture of the lower end of left radius, initial encounter for closed fracture: Secondary | ICD-10-CM | POA: Diagnosis not present

## 2020-05-15 ENCOUNTER — Other Ambulatory Visit: Payer: Self-pay | Admitting: Neurology

## 2020-06-06 DIAGNOSIS — M810 Age-related osteoporosis without current pathological fracture: Secondary | ICD-10-CM | POA: Diagnosis not present

## 2020-06-06 DIAGNOSIS — I129 Hypertensive chronic kidney disease with stage 1 through stage 4 chronic kidney disease, or unspecified chronic kidney disease: Secondary | ICD-10-CM | POA: Diagnosis not present

## 2020-06-06 DIAGNOSIS — N183 Chronic kidney disease, stage 3 unspecified: Secondary | ICD-10-CM | POA: Diagnosis not present

## 2020-06-06 DIAGNOSIS — I252 Old myocardial infarction: Secondary | ICD-10-CM | POA: Diagnosis not present

## 2020-06-06 DIAGNOSIS — M152 Bouchard's nodes (with arthropathy): Secondary | ICD-10-CM | POA: Diagnosis not present

## 2020-06-06 DIAGNOSIS — E7800 Pure hypercholesterolemia, unspecified: Secondary | ICD-10-CM | POA: Diagnosis not present

## 2020-06-06 DIAGNOSIS — M19079 Primary osteoarthritis, unspecified ankle and foot: Secondary | ICD-10-CM | POA: Diagnosis not present

## 2020-06-06 DIAGNOSIS — I2510 Atherosclerotic heart disease of native coronary artery without angina pectoris: Secondary | ICD-10-CM | POA: Diagnosis not present

## 2020-06-06 DIAGNOSIS — M179 Osteoarthritis of knee, unspecified: Secondary | ICD-10-CM | POA: Diagnosis not present

## 2020-07-13 ENCOUNTER — Encounter: Payer: Self-pay | Admitting: Cardiovascular Disease

## 2020-07-13 ENCOUNTER — Other Ambulatory Visit: Payer: Self-pay

## 2020-07-13 ENCOUNTER — Ambulatory Visit: Payer: PPO | Admitting: Cardiovascular Disease

## 2020-07-13 VITALS — BP 116/88 | HR 65 | Ht 65.0 in | Wt 133.0 lb

## 2020-07-13 DIAGNOSIS — E782 Mixed hyperlipidemia: Secondary | ICD-10-CM

## 2020-07-13 DIAGNOSIS — N1832 Chronic kidney disease, stage 3b: Secondary | ICD-10-CM | POA: Diagnosis not present

## 2020-07-13 DIAGNOSIS — R0609 Other forms of dyspnea: Secondary | ICD-10-CM

## 2020-07-13 DIAGNOSIS — R0602 Shortness of breath: Secondary | ICD-10-CM

## 2020-07-13 DIAGNOSIS — I2510 Atherosclerotic heart disease of native coronary artery without angina pectoris: Secondary | ICD-10-CM | POA: Diagnosis not present

## 2020-07-13 DIAGNOSIS — R0600 Dyspnea, unspecified: Secondary | ICD-10-CM

## 2020-07-13 NOTE — Patient Instructions (Signed)
Medication Instructions:  Your provider recommends that you continue on your current medications as directed. Please refer to the Current Medication list given to you today.   *If you need a refill on your cardiac medications before your next appointment, please call your pharmacy*  Testing/Procedures: Your provider has requested that you have an echocardiogram. Echocardiography is a painless test that uses sound waves to create images of your heart. It provides your doctor with information about the size and shape of your heart and how well your heart's chambers and valves are working. This procedure takes approximately one hour. There are no restrictions for this procedure.      Follow-Up: At [facility], you and your health needs are our priority.  As part of our continuing mission to provide you with exceptional heart care, we have created designated Provider Care Teams.  These Care Teams include your primary Cardiologist (physician) and Advanced Practice Providers (APPs -  Physician Assistants and Nurse Practitioners) who all work together to provide you with the care you need, when you need it. Your next appointment:   12 month(s) The format for your next appointment:   In Person Provider:   You may see Michael Cooper, MD or one of the following Advanced Practice Providers on your designated Care Team:    Scott Weaver, PA-C  Vin Bhagat, PA-C   

## 2020-07-13 NOTE — Progress Notes (Signed)
Cardiology Office Note:    Date:  07/13/2020   ID:  Taylor Mendoza, DOB 1929/12/20, MRN [id number]  PCP:  [name], MD  [facility] Cardiologist:  Sherren Mocha, MD  [facility] Electrophysiologist:  None   Referring MD: [name], MD   Chief Complaint  Patient presents with   Shortness of Breath    History of Present Illness:    Taylor Mendoza is a 86 y.o. female presenting for follow-up of CAD.  She had an acute inferior wall MI in 2010 and was treated with primary PCI of the right coronary artery with overlapping drug-eluting stents.  She had preserved LV systolic function at the time of her infarct.  The patient is here with her daughter today.  [facility].  The patient has developed marked exertional dyspnea.  She denies chest pain or pressure.  She denies orthopnea, PND, or leg swelling.  She has home with her husband all of the time because he has developed advanced dementia.  She is very inactive.  She is short of breath with just walking a few feet.  She denies cough, fever, chills.  Past Medical History:  Diagnosis Date   Coronary artery disease    Methicillin resistant Staphylococcus aureus in conditions classified elsewhere and of unspecified site    MI (myocardial infarction) ([place])    Other and unspecified hyperlipidemia    Unspecified essential hypertension     Past Surgical History:  Procedure Laterality Date   CATARACT EXTRACTION, BILATERAL     CORONARY ANGIOPLASTY WITH STENT PLACEMENT      Current Medications: Current Meds  Medication Sig   acetaminophen (TYLENOL) 325 MG tablet Take 325 mg by mouth daily as needed for moderate pain or headache.   atorvastatin (LIPITOR) 10 MG tablet Take 1 tablet by mouth daily.   carbidopa-levodopa (SINEMET IR) 25-100 MG tablet TAKE 1 TABLET BY MOUTH 3 TIMES A DAY.   docusate sodium (COLACE) 100 MG capsule Take 1 capsule (100 mg  total) by mouth every 12 (twelve) hours.   levocetirizine (XYZAL) 5 MG tablet Take 1 tablet by mouth daily.   losartan-hydrochlorothiazide (HYZAAR) 50-12.5 MG tablet Take 1 tablet by mouth daily.   metoprolol succinate (TOPROL-XL) 50 MG 24 hr tablet Take 1 tablet by mouth daily.   nitroGLYCERIN (NITROSTAT) 0.4 MG SL tablet Place 1 tablet (0.4 mg total) under the tongue every 5 (five) minutes as needed for chest pain.   pantoprazole (PROTONIX) 40 MG tablet Take 1 tablet by mouth daily.   polyethylene glycol (MIRALAX / GLYCOLAX) packet Take 17 g by mouth daily as needed for mild constipation.    triamcinolone cream (KENALOG) 0.1 % Apply 1 application topically 2 (two) times daily.     Allergies:   Patient has no known allergies.   Social History   Socioeconomic History   Marital status: Married    Spouse name: Not on file   Number of children: Not on file   Years of education: Not on file   Highest education level: Not on file  Occupational History   Not on file  Tobacco Use   Smoking status: Never Smoker   Smokeless tobacco: Never Used  Vaping Use   Vaping Use: Never used  Substance and Sexual Activity   Alcohol use: No   Drug use: No   Sexual activity: Not on file  Other Topics Concern   Not on file  Social  History Narrative   Right handed    [facility]   Social Determinants of Health   Financial Resource Strain:    Difficulty of Paying Living Expenses:   Food Insecurity:    Worried About Charity fundraiser in the Last Year:    Arboriculturist in the Last Year:   Transportation Needs:    Film/video editor (Medical):    Lack of Transportation (Non-Medical):   Physical Activity:    Days of Exercise per Week:    Minutes of Exercise per Session:   Stress:    Feeling of Stress :   Social Connections:    Frequency of Communication with Friends and Family:    Frequency of Social Gatherings with Friends and Family:    Attends  Religious Services:    Active Member of Clubs or Organizations:    Attends Archivist Meetings:    Marital Status:      Family History: The patient's family history includes Osteoporosis in her mother; Skin cancer in her mother; Stroke in her brother and father.  ROS:   Please see the history of present illness.    All other systems reviewed and are negative.  EKGs/Labs/Other Studies Reviewed:    EKG:  EKG is ordered today.  The ekg ordered today demonstrates normal sinus rhythm 65 bpm, within normal limits.  Recent Labs: 09/27/2019: ALT 5; BUN 31; Creatinine, Ser 1.62; Hemoglobin 13.7; Platelets 232; Potassium 3.5; Sodium 135  Recent Lipid Panel    Component Value Date/Time   CHOL 145 09/22/2009 1016   TRIG 97.0 09/22/2009 1016   HDL 52.60 09/22/2009 1016   CHOLHDL 3 09/22/2009 1016   VLDL 19.4 09/22/2009 1016   LDLCALC 73 09/22/2009 1016    Physical Exam:    VS:  BP 116/88    Pulse 65    Ht 5\' 5"  (1.651 m)    Wt 133 lb (60.3 kg)    SpO2 97%    BMI 22.13 kg/m     Wt Readings from Last 3 Encounters:  07/13/20 133 lb (60.3 kg)  03/16/20 139 lb (63 kg)  10/15/19 138 lb (62.6 kg)     GEN:  Well nourished, well developed elderly woman in no acute distress HEENT: Normal NECK: No JVD; No carotid bruits LYMPHATICS: No lymphadenopathy CARDIAC: RRR, no murmurs, rubs, gallops RESPIRATORY:  Clear to auscultation without rales, wheezing or rhonchi  ABDOMEN: Soft, non-tender, non-distended MUSCULOSKELETAL:  No edema; No deformity  SKIN: Warm and dry NEUROLOGIC:  Alert and oriented x 3 PSYCHIATRIC:  Normal affect   ASSESSMENT:    1. Shortness of breath   2. Coronary artery disease involving native coronary artery of native heart without angina pectoris   3. Exertional dyspnea   4. Stage 3b chronic kidney disease   5. Mixed hyperlipidemia    PLAN:    In order of problems listed above:  1. Considerations include deconditioning versus cardiac disease  (valvular/structural/ischemic).  No chest discomfort or symptoms reminiscent of her MI.  I think we should check an echocardiogram as she had mild to moderate valvular heart disease on her last echo study in 2017.  The patient had aortic insufficiency at the time of the study.  I do not appreciate a diastolic murmur on her exam today.  Would also be good to assess her LV function.  We talked about trying to go for short walks to build up endurance.  It is really difficult for her to get  out because of her husband's dementia. 2. Stable without angina.  Continue aspirin and atorvastatin. 3. As above 4. Most recent labs reviewed with a creatinine of 1.62 mg/dL 5. Treated with atorvastatin, LDL cholesterol 77 mg/dL   Medication Adjustments/Labs and Tests Ordered: Current medicines are reviewed at length with the patient today.  Concerns regarding medicines are outlined above.  Orders Placed This Encounter  Procedures   EKG 12-Lead   ECHOCARDIOGRAM COMPLETE   No orders of the defined types were placed in this encounter.   Patient Instructions  Medication Instructions:  Your provider recommends that you continue on your current medications as directed. Please refer to the Current Medication list given to you today.   *If you need a refill on your cardiac medications before your next appointment, please call your pharmacy*  Testing/Procedures: Your provider has requested that you have an echocardiogram. Echocardiography is a painless test that uses sound waves to create images of your heart. It provides your doctor with information about the size and shape of your heart and how well your hearts chambers and valves are working. This procedure takes approximately one hour. There are no restrictions for this procedure.  Follow-Up: At [facility], you and your health needs are our priority.  As part of our continuing mission to provide you with exceptional heart care, we have created designated  Provider Care Teams.  These Care Teams include your primary Cardiologist (physician) and Advanced Practice Providers (APPs -  Physician Assistants and Nurse Practitioners) who all work together to provide you with the care you need, when you need it. Your next appointment:   12 month(s) The format for your next appointment:   In Person Provider:   You may see Sherren Mocha, MD or one of the following Advanced Practice Providers on your designated Care Team:    Richardson Dopp, PA-C  Robbie Lis, [place]      Signed, Sherren Mocha, MD  07/13/2020 3:13 PM    [place]

## 2020-07-15 IMAGING — RF DG ESOPHAGUS
7 of 8 series · 14 of 24 positions shown · non-contrast
Comparison: Chest CT 06/01/2015 and abdominal CT 09/27/2019. Report
from modified barium swallow 08/07/2017.

CLINICAL DATA: Pharyngo esophageal dysphagia.

EXAM:
ESOPHOGRAM / BARIUM SWALLOW / BARIUM TABLET STUDY
TECHNIQUE: Combined double contrast and single contrast examination performed
using effervescent crystals, thick barium liquid, and thin barium
liquid. The patient was observed with fluoroscopy swallowing a 13 mm
barium sulphate tablet.
FLUOROSCOPY TIME:  Fluoroscopy Time: 1 minutes and 18 seconds of
low-dose pulsed fluoroscopy
Radiation Exposure Index (if provided by the fluoroscopic device):
28.9 mGy
Number of Acquired Spot Images: 2 rapid sequence series of the
cervical esophagus.

[Series 1: one shot · 3 of 6 slices shown (1 of 3)]
[im 1/6]
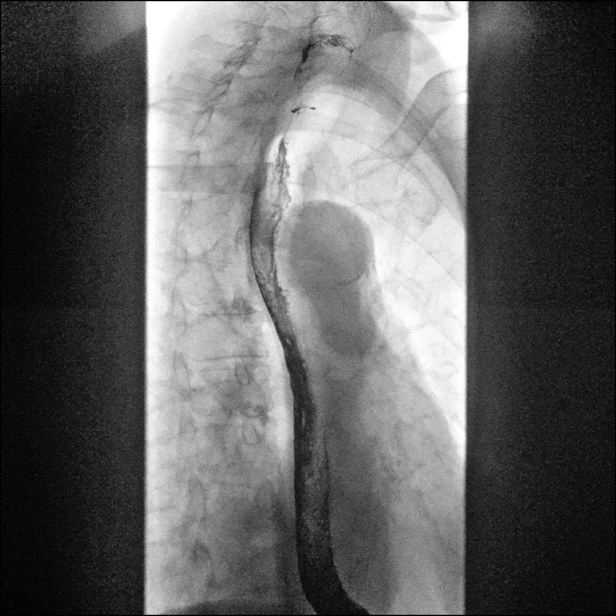
[im 4/6]
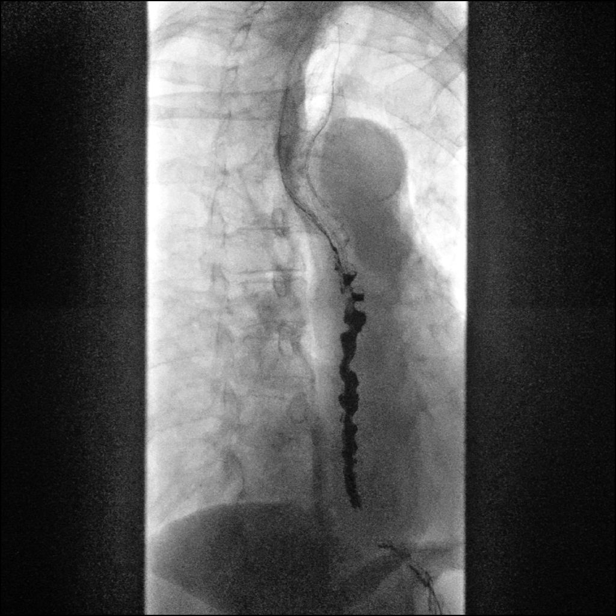
[im 6/6]
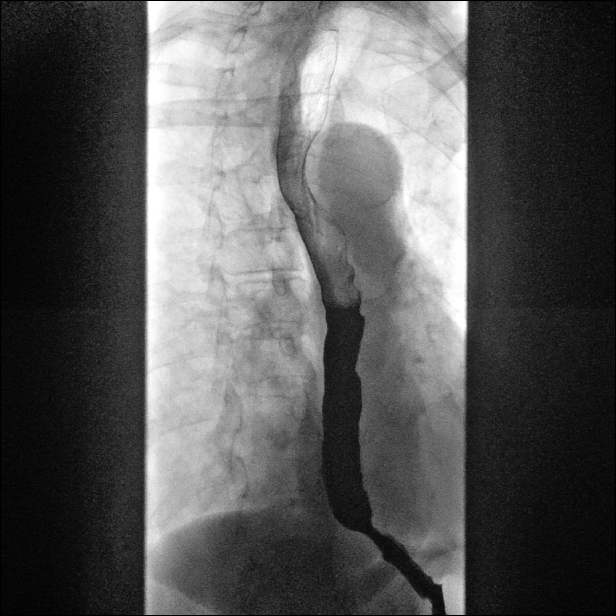

[Series 2: sequence · 0.31mm/px · 2 of 11 frames shown (1 of 4)]
[frame 6/11]
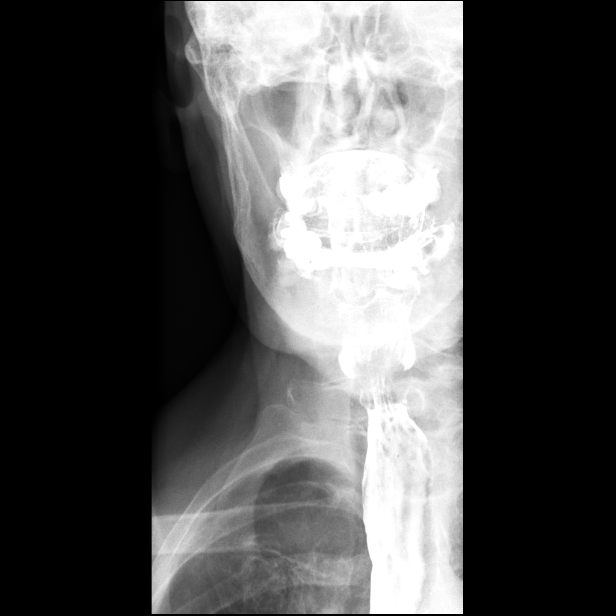
[frame 10/11]
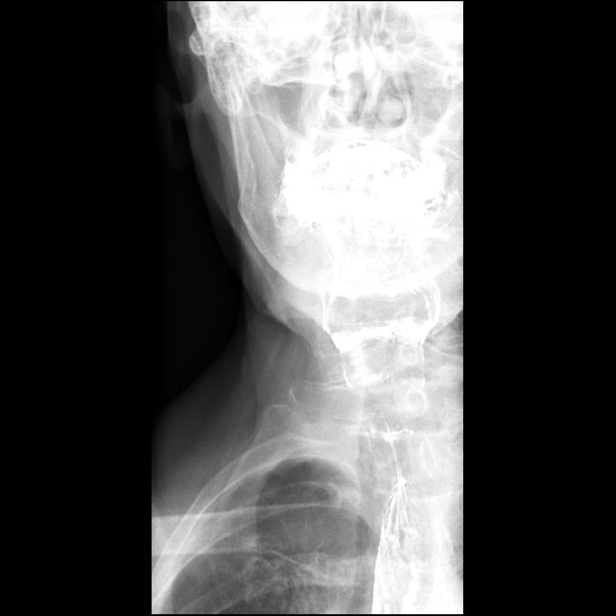

[Series 4: sequence · 0.31mm/px · 2 of 14 frames shown (2 of 4)]
[frame 3/14]
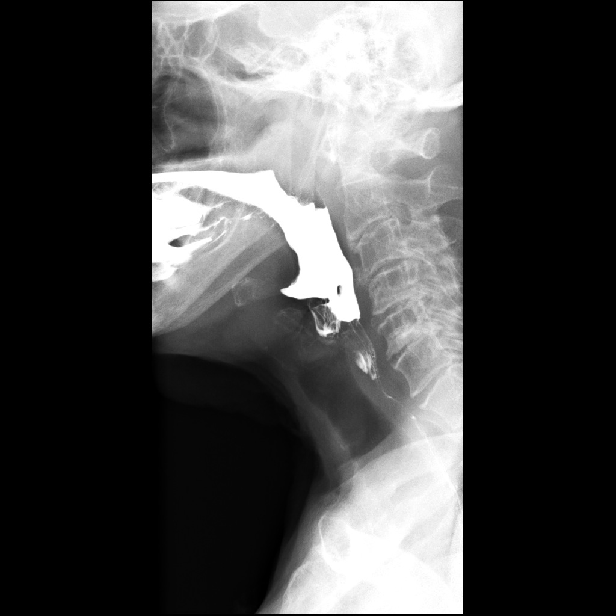
[frame 12/14]
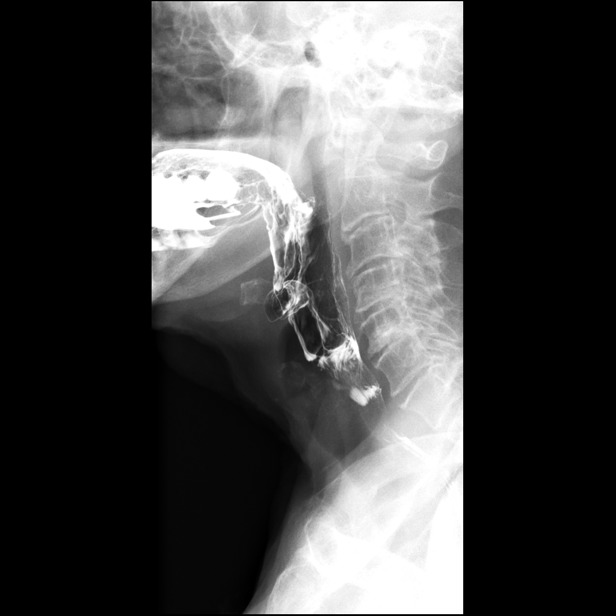

[Series 5: sequence · 2 of 73 frames shown (3 of 4)]
[frame 3/73]
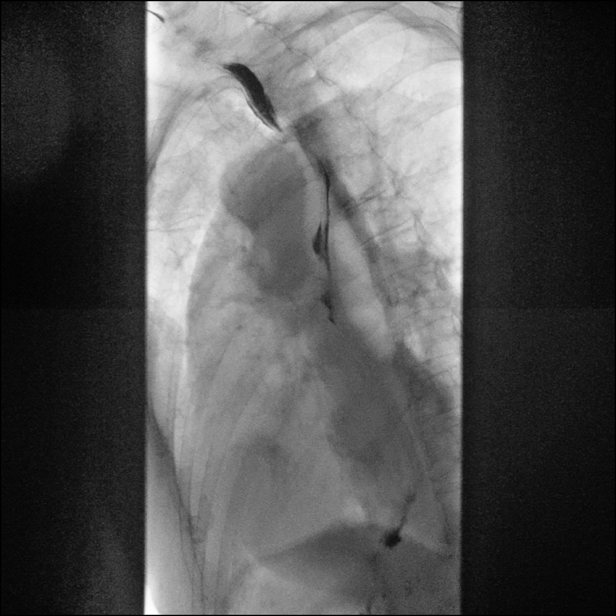
[frame 63/73]
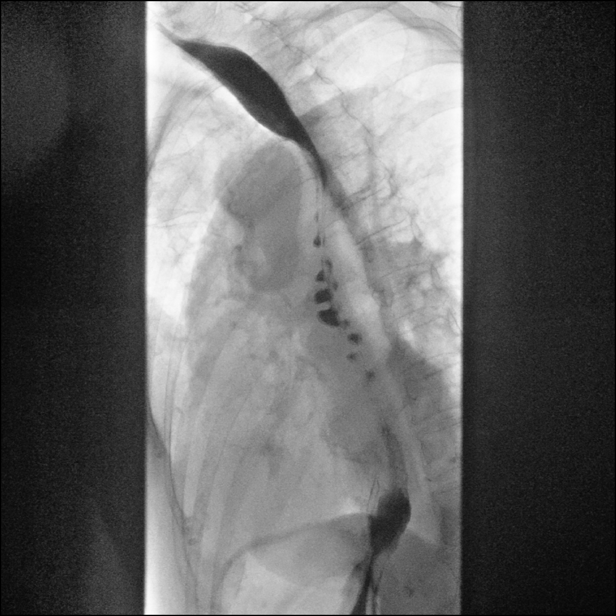

[Series 6: one shot · 2 of 5 slices shown (2 of 3)]
[im 2/5]
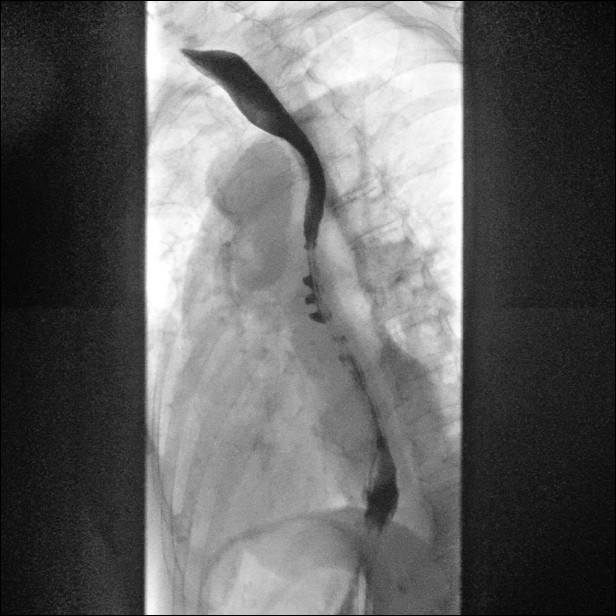
[im 5/5]
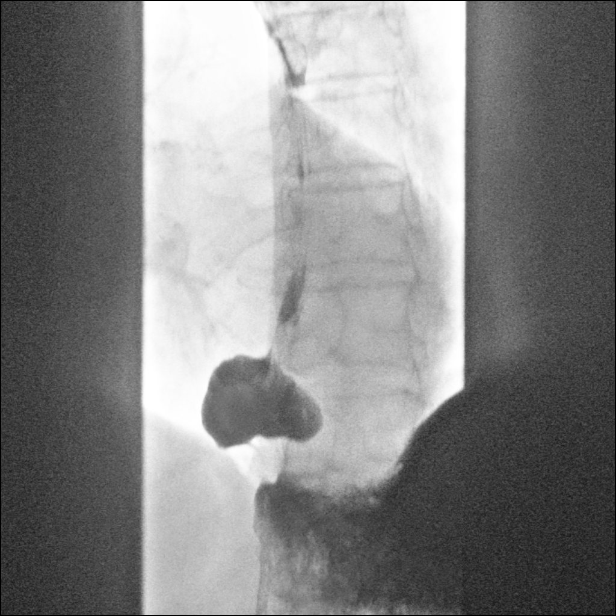

[Series 7: sequence · 2 of 32 frames shown (4 of 4)]
[frame 5/32]
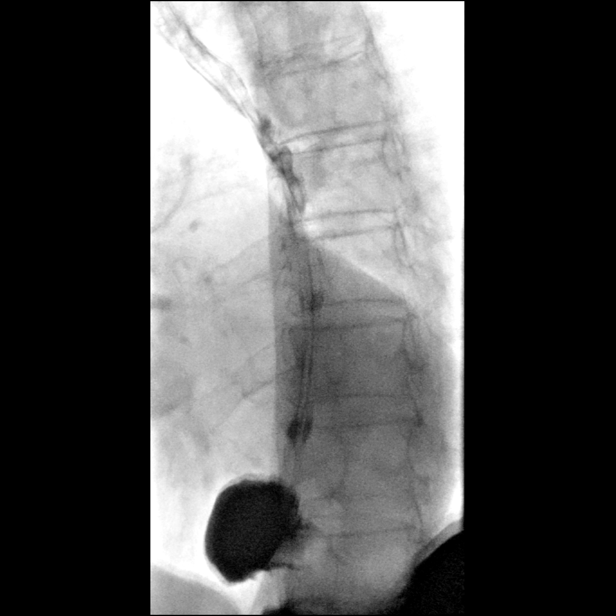
[frame 28/32]
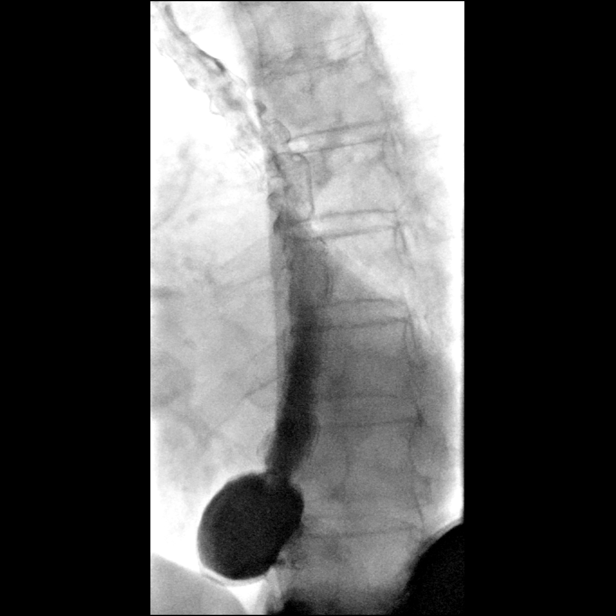

[Series 8: one shot · 1 of 2 slices shown (3 of 3)]
[im 2/2]
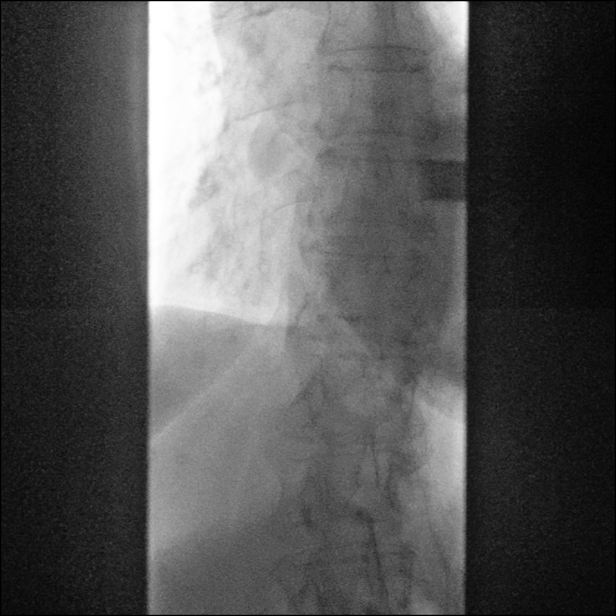

[14 of 24 positions shown; findings below may reference images not displayed]

FINDINGS: The patient swallowed the barium without difficulty. There is mild
presbyesophagus with a decreased primary stripping wave and
moderately increased tertiary contractions throughout the esophagus.
Rapid sequence imaging of the pharynx in the AP and lateral
projections demonstrates no mucosal abnormalities.

There is a small to moderate hiatal hernia with associated moderate
gastroesophageal reflux to the level of the carina. No esophageal
stricture, mass or ulceration identified.

A 13 mm barium tablet was administered and passed without delay into
the stomach.
IMPRESSION: 1. Findings consistent with mild-to-moderate presbyesophagus.
2. Small to moderate hiatal hernia with associated moderate
gastroesophageal reflux.
3. No evidence of esophageal stricture or ulceration.

## 2020-07-25 DIAGNOSIS — Z0000 Encounter for general adult medical examination without abnormal findings: Secondary | ICD-10-CM | POA: Diagnosis not present

## 2020-07-25 DIAGNOSIS — I129 Hypertensive chronic kidney disease with stage 1 through stage 4 chronic kidney disease, or unspecified chronic kidney disease: Secondary | ICD-10-CM | POA: Diagnosis not present

## 2020-07-25 DIAGNOSIS — N1832 Chronic kidney disease, stage 3b: Secondary | ICD-10-CM | POA: Diagnosis not present

## 2020-07-25 DIAGNOSIS — Z7189 Other specified counseling: Secondary | ICD-10-CM | POA: Diagnosis not present

## 2020-07-25 DIAGNOSIS — Z1389 Encounter for screening for other disorder: Secondary | ICD-10-CM | POA: Diagnosis not present

## 2020-07-25 DIAGNOSIS — I700 Atherosclerosis of aorta: Secondary | ICD-10-CM | POA: Diagnosis not present

## 2020-07-25 DIAGNOSIS — I2510 Atherosclerotic heart disease of native coronary artery without angina pectoris: Secondary | ICD-10-CM | POA: Diagnosis not present

## 2020-07-25 DIAGNOSIS — G20 Parkinson's disease: Secondary | ICD-10-CM | POA: Diagnosis not present

## 2020-07-25 DIAGNOSIS — K219 Gastro-esophageal reflux disease without esophagitis: Secondary | ICD-10-CM | POA: Diagnosis not present

## 2020-08-07 ENCOUNTER — Ambulatory Visit: Payer: PPO | Admitting: Cardiovascular Disease

## 2020-08-08 DIAGNOSIS — M19079 Primary osteoarthritis, unspecified ankle and foot: Secondary | ICD-10-CM | POA: Diagnosis not present

## 2020-08-08 DIAGNOSIS — N183 Chronic kidney disease, stage 3 unspecified: Secondary | ICD-10-CM | POA: Diagnosis not present

## 2020-08-08 DIAGNOSIS — I129 Hypertensive chronic kidney disease with stage 1 through stage 4 chronic kidney disease, or unspecified chronic kidney disease: Secondary | ICD-10-CM | POA: Diagnosis not present

## 2020-08-08 DIAGNOSIS — I252 Old myocardial infarction: Secondary | ICD-10-CM | POA: Diagnosis not present

## 2020-08-08 DIAGNOSIS — I2510 Atherosclerotic heart disease of native coronary artery without angina pectoris: Secondary | ICD-10-CM | POA: Diagnosis not present

## 2020-08-08 DIAGNOSIS — M152 Bouchard's nodes (with arthropathy): Secondary | ICD-10-CM | POA: Diagnosis not present

## 2020-08-08 DIAGNOSIS — M179 Osteoarthritis of knee, unspecified: Secondary | ICD-10-CM | POA: Diagnosis not present

## 2020-08-08 DIAGNOSIS — M810 Age-related osteoporosis without current pathological fracture: Secondary | ICD-10-CM | POA: Diagnosis not present

## 2020-08-08 DIAGNOSIS — I10 Essential (primary) hypertension: Secondary | ICD-10-CM | POA: Diagnosis not present

## 2020-08-08 DIAGNOSIS — E7800 Pure hypercholesterolemia, unspecified: Secondary | ICD-10-CM | POA: Diagnosis not present

## 2020-08-29 DIAGNOSIS — I129 Hypertensive chronic kidney disease with stage 1 through stage 4 chronic kidney disease, or unspecified chronic kidney disease: Secondary | ICD-10-CM | POA: Diagnosis not present

## 2020-08-29 DIAGNOSIS — I252 Old myocardial infarction: Secondary | ICD-10-CM | POA: Diagnosis not present

## 2020-08-29 DIAGNOSIS — M152 Bouchard's nodes (with arthropathy): Secondary | ICD-10-CM | POA: Diagnosis not present

## 2020-08-29 DIAGNOSIS — N183 Chronic kidney disease, stage 3 unspecified: Secondary | ICD-10-CM | POA: Diagnosis not present

## 2020-08-29 DIAGNOSIS — M810 Age-related osteoporosis without current pathological fracture: Secondary | ICD-10-CM | POA: Diagnosis not present

## 2020-08-29 DIAGNOSIS — M179 Osteoarthritis of knee, unspecified: Secondary | ICD-10-CM | POA: Diagnosis not present

## 2020-08-29 DIAGNOSIS — E7800 Pure hypercholesterolemia, unspecified: Secondary | ICD-10-CM | POA: Diagnosis not present

## 2020-08-29 DIAGNOSIS — M19079 Primary osteoarthritis, unspecified ankle and foot: Secondary | ICD-10-CM | POA: Diagnosis not present

## 2020-08-29 DIAGNOSIS — I10 Essential (primary) hypertension: Secondary | ICD-10-CM | POA: Diagnosis not present

## 2020-08-29 DIAGNOSIS — I2510 Atherosclerotic heart disease of native coronary artery without angina pectoris: Secondary | ICD-10-CM | POA: Diagnosis not present

## 2020-08-30 ENCOUNTER — Telehealth (HOSPITAL_COMMUNITY): Payer: Self-pay | Admitting: Cardiovascular Disease

## 2020-08-30 NOTE — Telephone Encounter (Signed)
Patient called and cancelled echocardiogram for 08/31/2020. She did not wish to reschedule at this time. The order will be removed from the Echo WQ and if patient calls back to schedule we will reinstate the order. Thank you.

## 2020-08-31 ENCOUNTER — Other Ambulatory Visit (HOSPITAL_COMMUNITY): Payer: PPO

## 2020-09-01 ENCOUNTER — Other Ambulatory Visit: Payer: Self-pay | Admitting: Neurology

## 2020-09-05 DIAGNOSIS — I872 Venous insufficiency (chronic) (peripheral): Secondary | ICD-10-CM | POA: Diagnosis not present

## 2020-09-07 ENCOUNTER — Ambulatory Visit: Payer: PPO | Admitting: Neurology

## 2020-09-11 NOTE — Progress Notes (Signed)
Assessment/Plan:   1.  Parkinsons Disease, tremor predominant (no longer with tremor)  -Continue carbidopa/levodopa 25/100, 1 tablet 3 times per day  -discussed doing Parkinsons Disease PT.  She lives at [facility] and they have a PT group in there.  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  Will let me know if changes mind  -discussed Parkinsons Disease and her type and prognosis as son never present previously  2.  Follow up is anticipated in the next 6-8 months, sooner should new neurologic issues arise.   Subjective:   Taylor Mendoza was seen today in follow up for Parkinsons disease.  My previous records were reviewed prior to todays visit as well as outside records available to me. Son present and supplements hx.  Pt had a fall - slipped on hardwood floor - they had cleaned/waxed the hardwood floor and she slipped in her socks and she fx her wrist.  No surgery was required.  She caregives for her husband and they got extra help then.  She has comfort keepers 2 days a week, 3 hours each time.  Pt denies lightheadedness, near syncope.  No hallucinations.  Mood has been good.  Last saw cardiology on August 5 for dyspnea on exertion.  Those notes are reviewed.  Echocardiogram was ordered, but I do not see that has been completed yet.  In fact, it looks like the patient canceled the echo.  Current prescribed movement disorder medications: Carbidopa/levodopa 25/100, 1 tablet 3 times daily    ALLERGIES:  No Known Allergies  CURRENT MEDICATIONS:  Outpatient Encounter Medications as of 09/12/2020  Medication Sig  . acetaminophen (TYLENOL) 325 MG tablet Take 325 mg by mouth daily as needed for moderate pain or headache.  Marland Kitchen atorvastatin (LIPITOR) 10 MG tablet Take 1 tablet by mouth daily.  . carbidopa-levodopa (SINEMET IR) 25-100 MG tablet TAKE 1 TABLET BY MOUTH 3 TIMES A DAY.  Marland Kitchen levocetirizine (XYZAL) 5  MG tablet Take 1 tablet by mouth daily.  Marland Kitchen losartan-hydrochlorothiazide (HYZAAR) 50-12.5 MG tablet Take 1 tablet by mouth daily.  . metoprolol succinate (TOPROL-XL) 50 MG 24 hr tablet Take 1 tablet by mouth daily.  . pantoprazole (PROTONIX) 40 MG tablet Take 1 tablet by mouth daily.  . polyethylene glycol (MIRALAX / GLYCOLAX) packet Take 17 g by mouth daily as needed for mild constipation.   . triamcinolone cream (KENALOG) 0.1 % Apply 1 application topically 2 (two) times daily.  . nitroGLYCERIN (NITROSTAT) 0.4 MG SL tablet Place 1 tablet (0.4 mg total) under the tongue every 5 (five) minutes as needed for chest pain.  . [DISCONTINUED] docusate sodium (COLACE) 100 MG capsule Take 1 capsule (100 mg total) by mouth every 12 (twelve) hours. (Patient not taking: Reported on 09/12/2020)   No facility-administered encounter medications on file as of 09/12/2020.    Objective:   PHYSICAL EXAMINATION:    VITALS:   Vitals:   09/12/20 1445  BP: (!) 185/92  Pulse: 73  SpO2: 96%  Weight: 134 lb (60.8 kg)  Height: 5\' 6"  (1.676 m)    GEN:  The patient appears stated age and is in NAD. HEENT:  Normocephalic, atraumatic.  The mucous membranes are moist. The superficial temporal arteries are without ropiness or tenderness. CV:  RRR Lungs:  CTAB Neck/HEME:  There are no carotid bruits bilaterally.  Neurological examination:  Orientation: The patient is alert and oriented  x3. Cranial nerves: There is good facial symmetry with min facial hypomimia. The speech is fluent and clear. Soft palate rises symmetrically and there is no tongue deviation. Hearing is intact to conversational tone. Sensation: Sensation is intact to light touch throughout Motor: Strength is at least antigravity x4.  Movement examination: Tone: There is mild increased tone in the LUE Abnormal movements: none Coordination:  There is mild decremation with RAM's Gait and Station: The patient has mild difficulty arising out of a  deep-seated chair without the use of the hands. The patient's stride length is decreased and she drags the right leg.    I have reviewed and interpreted the following labs independently    Chemistry      Component Value Date/Time   NA 135 09/27/2019 1023   K 3.5 09/27/2019 1023   CL 99 09/27/2019 1023   CO2 25 09/27/2019 1023   BUN 31 (H) 09/27/2019 1023   CREATININE 1.62 (H) 09/27/2019 1023   CREATININE 1.30 (H) 07/22/2017 1448      Component Value Date/Time   CALCIUM 9.4 09/27/2019 1023   ALKPHOS 79 09/27/2019 1023   AST 21 09/27/2019 1023   ALT 5 09/27/2019 1023   BILITOT 1.0 09/27/2019 1023       Lab Results  Component Value Date   WBC 9.5 09/27/2019   HGB 13.7 09/27/2019   HCT 41.5 09/27/2019   MCV 89.4 09/27/2019   PLT 232 09/27/2019    Lab Results  Component Value Date   TSH 0.48 07/22/2017     Total time spent on today's visit was 30 minutes, including both face-to-face time and nonface-to-face time.  Time included that spent on review of records (prior notes available to me/labs/imaging if pertinent), discussing treatment and goals, answering patient's questions and coordinating care.  Cc:  [name], MD

## 2020-09-12 ENCOUNTER — Ambulatory Visit: Payer: PPO | Admitting: Neurology

## 2020-09-12 ENCOUNTER — Other Ambulatory Visit: Payer: Self-pay

## 2020-09-12 ENCOUNTER — Encounter: Payer: Self-pay | Admitting: Neurology

## 2020-09-12 VITALS — BP 185/92 | HR 73 | Ht 66.0 in | Wt 134.0 lb

## 2020-09-12 DIAGNOSIS — G20 Parkinson's disease: Secondary | ICD-10-CM | POA: Diagnosis not present

## 2020-09-12 NOTE — Patient Instructions (Signed)
No changes in your medication.  The physicians and staff at [facility] are committed to providing excellent care. You may receive a survey requesting feedback about your experience at our office. We strive to receive "very good" responses to the survey questions. If you feel that your experience would prevent you from giving the office a "very good " response, please contact our office to try to remedy the situation. We may be reached at [phone]. Thank you for taking the time out of your busy day to complete the survey.

## 2020-09-25 DIAGNOSIS — N1831 Chronic kidney disease, stage 3a: Secondary | ICD-10-CM | POA: Diagnosis not present

## 2020-09-29 DIAGNOSIS — M179 Osteoarthritis of knee, unspecified: Secondary | ICD-10-CM | POA: Diagnosis not present

## 2020-09-29 DIAGNOSIS — E7800 Pure hypercholesterolemia, unspecified: Secondary | ICD-10-CM | POA: Diagnosis not present

## 2020-09-29 DIAGNOSIS — I129 Hypertensive chronic kidney disease with stage 1 through stage 4 chronic kidney disease, or unspecified chronic kidney disease: Secondary | ICD-10-CM | POA: Diagnosis not present

## 2020-09-29 DIAGNOSIS — I252 Old myocardial infarction: Secondary | ICD-10-CM | POA: Diagnosis not present

## 2020-09-29 DIAGNOSIS — I10 Essential (primary) hypertension: Secondary | ICD-10-CM | POA: Diagnosis not present

## 2020-09-29 DIAGNOSIS — I2510 Atherosclerotic heart disease of native coronary artery without angina pectoris: Secondary | ICD-10-CM | POA: Diagnosis not present

## 2020-09-29 DIAGNOSIS — M19079 Primary osteoarthritis, unspecified ankle and foot: Secondary | ICD-10-CM | POA: Diagnosis not present

## 2020-09-29 DIAGNOSIS — M152 Bouchard's nodes (with arthropathy): Secondary | ICD-10-CM | POA: Diagnosis not present

## 2020-09-29 DIAGNOSIS — M810 Age-related osteoporosis without current pathological fracture: Secondary | ICD-10-CM | POA: Diagnosis not present

## 2020-09-29 DIAGNOSIS — N183 Chronic kidney disease, stage 3 unspecified: Secondary | ICD-10-CM | POA: Diagnosis not present

## 2020-12-28 ENCOUNTER — Other Ambulatory Visit: Payer: Self-pay | Admitting: Neurology

## 2021-01-03 DIAGNOSIS — M179 Osteoarthritis of knee, unspecified: Secondary | ICD-10-CM | POA: Diagnosis not present

## 2021-01-03 DIAGNOSIS — I10 Essential (primary) hypertension: Secondary | ICD-10-CM | POA: Diagnosis not present

## 2021-01-03 DIAGNOSIS — G20 Parkinson's disease: Secondary | ICD-10-CM | POA: Diagnosis not present

## 2021-01-03 DIAGNOSIS — E7800 Pure hypercholesterolemia, unspecified: Secondary | ICD-10-CM | POA: Diagnosis not present

## 2021-01-03 DIAGNOSIS — K219 Gastro-esophageal reflux disease without esophagitis: Secondary | ICD-10-CM | POA: Diagnosis not present

## 2021-01-03 DIAGNOSIS — I2510 Atherosclerotic heart disease of native coronary artery without angina pectoris: Secondary | ICD-10-CM | POA: Diagnosis not present

## 2021-01-03 DIAGNOSIS — M810 Age-related osteoporosis without current pathological fracture: Secondary | ICD-10-CM | POA: Diagnosis not present

## 2021-01-03 DIAGNOSIS — I129 Hypertensive chronic kidney disease with stage 1 through stage 4 chronic kidney disease, or unspecified chronic kidney disease: Secondary | ICD-10-CM | POA: Diagnosis not present

## 2021-01-03 DIAGNOSIS — N183 Chronic kidney disease, stage 3 unspecified: Secondary | ICD-10-CM | POA: Diagnosis not present

## 2021-01-03 DIAGNOSIS — M19079 Primary osteoarthritis, unspecified ankle and foot: Secondary | ICD-10-CM | POA: Diagnosis not present

## 2021-01-03 DIAGNOSIS — M152 Bouchard's nodes (with arthropathy): Secondary | ICD-10-CM | POA: Diagnosis not present

## 2021-01-03 DIAGNOSIS — I252 Old myocardial infarction: Secondary | ICD-10-CM | POA: Diagnosis not present

## 2021-03-05 ENCOUNTER — Emergency Department (HOSPITAL_COMMUNITY): Payer: PPO

## 2021-03-05 ENCOUNTER — Emergency Department (HOSPITAL_COMMUNITY)
Admission: EM | Admit: 2021-03-05 | Discharge: 2021-03-05 | Disposition: A | Discharge status: Home / Self Care | Payer: PPO | Attending: Emergency Medicine | Admitting: Emergency Medicine

## 2021-03-05 DIAGNOSIS — S43015A Anterior dislocation of left humerus, initial encounter: Secondary | ICD-10-CM | POA: Diagnosis not present

## 2021-03-05 DIAGNOSIS — I2510 Atherosclerotic heart disease of native coronary artery without angina pectoris: Secondary | ICD-10-CM | POA: Insufficient documentation

## 2021-03-05 DIAGNOSIS — Z79899 Other long term (current) drug therapy: Secondary | ICD-10-CM | POA: Insufficient documentation

## 2021-03-05 DIAGNOSIS — I10 Essential (primary) hypertension: Secondary | ICD-10-CM | POA: Insufficient documentation

## 2021-03-05 DIAGNOSIS — W2201XA Walked into wall, initial encounter: Secondary | ICD-10-CM | POA: Insufficient documentation

## 2021-03-05 DIAGNOSIS — S4292XA Fracture of left shoulder girdle, part unspecified, initial encounter for closed fracture: Secondary | ICD-10-CM

## 2021-03-05 DIAGNOSIS — S43005A Unspecified dislocation of left shoulder joint, initial encounter: Secondary | ICD-10-CM | POA: Diagnosis not present

## 2021-03-05 DIAGNOSIS — Z955 Presence of coronary angioplasty implant and graft: Secondary | ICD-10-CM | POA: Insufficient documentation

## 2021-03-05 DIAGNOSIS — W19XXXA Unspecified fall, initial encounter: Secondary | ICD-10-CM | POA: Diagnosis not present

## 2021-03-05 DIAGNOSIS — S4992XA Unspecified injury of left shoulder and upper arm, initial encounter: Secondary | ICD-10-CM | POA: Diagnosis present

## 2021-03-05 DIAGNOSIS — Y92009 Unspecified place in unspecified non-institutional (private) residence as the place of occurrence of the external cause: Secondary | ICD-10-CM | POA: Diagnosis not present

## 2021-03-05 DIAGNOSIS — M25519 Pain in unspecified shoulder: Secondary | ICD-10-CM | POA: Diagnosis not present

## 2021-03-05 MED ORDER — SODIUM CHLORIDE 0.9 % IV SOLN: INTRAVENOUS | Status: DC

## 2021-03-05 NOTE — ED Provider Notes (Signed)
[facility] DEPT Provider Note   CSN: [id number] Arrival date & time: 03/05/21  1334     History Chief Complaint  Patient presents with  . Fall    Taylor Mendoza is a 87 y.o. female.  87 year old female presents had mechanical fall just prior to arrival.  States that she struck her left shoulder.  Denies any head or neck trauma.  Denies any rib injury.  No pain from below the waist.  Pain in her shoulder is sharp and worse with movement.  Denies any distal left hand numbness or weakening.  Called EMS and was transported here        Past Medical History:  Diagnosis Date  . Coronary artery disease   . Methicillin resistant Staphylococcus aureus in conditions classified elsewhere and of unspecified site   . MI (myocardial infarction) ([place])   . Other and unspecified hyperlipidemia   . Unspecified essential hypertension     Patient Active Problem List   Diagnosis Date Noted  . DIZZINESS 08/11/2009  . ACUT MI INFEROLAT WALL SUBSQT EPIS CARE 05/05/2009  . MRSA 04/03/2009  . HYPERLIPIDEMIA-MIXED 04/03/2009  . HYPERTENSION, UNSPECIFIED 04/03/2009  . CAD, NATIVE VESSEL 03/12/2009    Past Surgical History:  Procedure Laterality Date  . CATARACT EXTRACTION, BILATERAL    . CORONARY ANGIOPLASTY WITH STENT PLACEMENT       OB History   No obstetric history on file.     Family History  Problem Relation Age of Onset  . Osteoporosis Mother   . Skin cancer Mother   . Stroke Father   . Stroke Brother     Social History   Tobacco Use  . Smoking status: Never Smoker  . Smokeless tobacco: Never Used  Vaping Use  . Vaping Use: Never used  Substance Use Topics  . Alcohol use: No  . Drug use: No    Home Medications Prior to Admission medications   Medication Sig Start Date End Date Taking? Authorizing Provider  acetaminophen (TYLENOL) 325 MG tablet Take 325 mg by mouth daily as needed for moderate pain or headache.    [provider]  atorvastatin (LIPITOR) 10 MG tablet Take 1 tablet by mouth daily. 04/25/12   [provider]  carbidopa-levodopa (SINEMET IR) 25-100 MG tablet TAKE 1 TABLET BY MOUTH 3 TIMES A DAY. 12/28/20   Tat, Eustace Quail, DO  levocetirizine (XYZAL) 5 MG tablet Take 1 tablet by mouth daily. 08/13/16   [provider]  losartan-hydrochlorothiazide (HYZAAR) 50-12.5 MG tablet Take 1 tablet by mouth daily. 06/19/19   [provider]  metoprolol succinate (TOPROL-XL) 50 MG 24 hr tablet Take 1 tablet by mouth daily. 09/12/16   [provider]  nitroGLYCERIN (NITROSTAT) 0.4 MG SL tablet Place 1 tablet (0.4 mg total) under the tongue every 5 (five) minutes as needed for chest pain. 06/12/18 07/13/20  Dorothy Spark, MD  pantoprazole (PROTONIX) 40 MG tablet Take 1 tablet by mouth daily. 05/07/12   [provider]  polyethylene glycol (MIRALAX / GLYCOLAX) packet Take 17 g by mouth daily as needed for mild constipation.     [provider]  triamcinolone cream (KENALOG) 0.1 % Apply 1 application topically 2 (two) times daily.    [provider]    Allergies    Patient has no known allergies.  Review of Systems   Review of Systems  All other systems reviewed and are negative.   Physical Exam Updated Vital Signs BP Marland Kitchen)  220/80 (BP Location: Right Arm)   Pulse 63   Temp 97.6 F (36.4 C) (Oral)   Resp 18   Ht 1.676 m (5\' 6" )   Wt 61.7 kg   SpO2 99%   BMI 21.95 kg/m   Physical Exam Vitals and nursing note reviewed.  Constitutional:      General: She is not in acute distress.    Appearance: Normal appearance. She is well-developed. She is not toxic-appearing.  HENT:     Head: Normocephalic and atraumatic.  Eyes:     General: Lids are normal.     Conjunctiva/sclera: Conjunctivae normal.     Pupils: Pupils are equal, round, and reactive to light.  Neck:     Thyroid: No thyroid mass.     Trachea: No tracheal deviation.   Cardiovascular:     Rate and Rhythm: Normal rate and regular rhythm.     Heart sounds: Normal heart sounds. No murmur heard. No gallop.   Pulmonary:     Effort: Pulmonary effort is normal. No respiratory distress.     Breath sounds: Normal breath sounds. No stridor. No decreased breath sounds, wheezing, rhonchi or rales.  Abdominal:     General: Bowel sounds are normal. There is no distension.     Palpations: Abdomen is soft.     Tenderness: There is no abdominal tenderness. There is no rebound.  Musculoskeletal:        General: Normal range of motion.     Left shoulder: Deformity, tenderness and bony tenderness present.     Cervical back: Normal range of motion and neck supple.     Comments: Neurovascular status intact at left hand  Skin:    General: Skin is warm and dry.     Findings: No abrasion or rash.  Neurological:     Mental Status: She is alert and oriented to person, place, and time.     GCS: GCS eye subscore is 4. GCS verbal subscore is 5. GCS motor subscore is 6.     Cranial Nerves: No cranial nerve deficit.     Sensory: No sensory deficit.  Psychiatric:        Speech: Speech normal.        Behavior: Behavior normal.     ED Results / Procedures / Treatments   Labs (all labs ordered are listed, but only abnormal results are displayed) Labs Reviewed - No data to display  EKG None  Radiology No results found.  Procedures .Ortho Injury Treatment  Date/Time: 03/05/2021 3:18 PM Performed by: Lacretia Leigh, MD Authorized by: Lacretia Leigh, MD   Consent:    Consent obtained:  Verbal   Consent given by:  Patient   Risks discussed:  Irreducible dislocation   Alternatives discussed:  Delayed treatmentInjury location: shoulder Location details: left shoulder Injury type: dislocation Dislocation type: anterior Hill-Sachs deformity: no Chronicity: new Pre-procedure neurovascular assessment: neurovascularly intact Pre-procedure distal perfusion:  normal Pre-procedure neurological function: normal Pre-procedure range of motion: normal  Anesthesia: Local anesthesia used: no  Patient sedated: NoManipulation performed: yes Reduction method: Stimson maneuver Reduction successful: yes X-ray confirmed reduction: yes Immobilization: sling Splint Applied by: ED Nurse Post-procedure neurovascular assessment: post-procedure neurovascularly intact Post-procedure distal perfusion: normal Post-procedure neurological function: normal Post-procedure range of motion: normal      Medications Ordered in ED Medications - No data to display  ED Course  I have reviewed the triage vital signs and the nursing notes.  Pertinent labs & imaging results that were available during my  care of the patient were reviewed by me and considered in my medical decision making (see chart for details).    MDM Rules/Calculators/A&P                          X-ray shows good alignment after the reduction of dislocation.  Sling applied and will discharge home Final Clinical Impression(s) / ED Diagnoses Final diagnoses:  None    Rx / DC Orders ED Discharge Orders    None       Lacretia Leigh, MD 03/05/21 1636

## 2021-03-05 NOTE — Discharge Instructions (Addendum)
Use Tylenol and/or Motrin as needed for pain

## 2021-03-05 NOTE — ED Triage Notes (Signed)
Ems brings pt in from home. Pt states she stood up out of the chair and fell into the wall. Pt complains of left shoulder pain.

## 2021-03-13 NOTE — Progress Notes (Deleted)
Assessment/Plan:   1.  Parkinsons Disease, tremor predominant (no longer with tremor)  -Continue carbidopa/levodopa 25/100, 1 tablet 3 times per day  -Patient has had a few falls, 1 of which resulted in a recent shoulder dislocation.  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  [facility] and [facility] have a physical therapy group and she does have caregivers a few days a week for the husband, and I encouraged her to consider physical therapy during that time.   2.  Follow up is anticipated in the next 6-8 months, sooner should new neurologic issues arise.   Subjective:   Taylor Mendoza was seen today in follow up for Parkinsons disease.  My previous records were reviewed prior to todays visit as well as outside records available to me. Son present and supplements hx. had fallen and hit her shoulder and dislocated it.  It was reduced in the emergency room.  Current prescribed movement disorder medications: Carbidopa/levodopa 25/100, 1 tablet 3 times daily    ALLERGIES:   Allergies  Allergen Reactions  . Amlodipine Anaphylaxis, Swelling and Other (See Comments)    Edema  . Alendronate Other (See Comments)    Dysphagia  . Ramipril Cough    CURRENT MEDICATIONS:  Outpatient Encounter Medications as of 03/15/2021  Medication Sig  . acetaminophen (TYLENOL) 500 MG tablet Take 1,000 mg by mouth at bedtime.  Marland Kitchen atorvastatin (LIPITOR) 10 MG tablet Take 10 mg by mouth at bedtime.  . carbidopa-levodopa (SINEMET IR) 25-100 MG tablet TAKE 1 TABLET BY MOUTH 3 TIMES A DAY. (Patient taking differently: Take 1 tablet by mouth with breakfast, with lunch, and with evening meal.)  . metoprolol succinate (TOPROL-XL) 50 MG 24 hr tablet Take 50 mg by mouth daily.  . nitroGLYCERIN (NITROSTAT) 0.4 MG SL tablet Place 1 tablet (0.4 mg total) under the tongue every 5 (five) minutes as needed for chest pain.  .  pantoprazole (PROTONIX) 40 MG tablet Take 1 tablet by mouth daily. (Patient not taking: No sig reported)  . polyethylene glycol (MIRALAX / GLYCOLAX) packet Take 17 g by mouth daily as needed for mild constipation (MIX AND DRINK).  Marland Kitchen triamcinolone cream (KENALOG) 0.1 % Apply 1 application topically See admin instructions. Apply to affected areas once daily   No facility-administered encounter medications on file as of 03/15/2021.    Objective:   PHYSICAL EXAMINATION:    VITALS:   There were no vitals filed for this visit.  GEN:  The patient appears stated age and is in NAD. HEENT:  Normocephalic, atraumatic.  The mucous membranes are moist. The superficial temporal arteries are without ropiness or tenderness. CV:  RRR Lungs:  CTAB Neck/HEME:  There are no carotid bruits bilaterally.  Neurological examination:  Orientation: The patient is alert and oriented x3. Cranial nerves: There is good facial symmetry with min facial hypomimia. The speech is fluent and clear. Soft palate rises symmetrically and there is no tongue deviation. Hearing is intact to conversational tone. Sensation: Sensation is intact to light touch throughout Motor: Strength is at least antigravity x4.  Movement examination: Tone: There is mild increased tone in the LUE Abnormal movements: none Coordination:  There is mild decremation with RAM's Gait and Station: The patient has mild difficulty arising out of a deep-seated chair without the use of the hands. The patient's stride length is decreased and she drags the right  leg.    I have reviewed and interpreted the following labs independently    Chemistry      Component Value Date/Time   NA 135 09/27/2019 1023   K 3.5 09/27/2019 1023   CL 99 09/27/2019 1023   CO2 25 09/27/2019 1023   BUN 31 (H) 09/27/2019 1023   CREATININE 1.62 (H) 09/27/2019 1023   CREATININE 1.30 (H) 07/22/2017 1448      Component Value Date/Time   CALCIUM 9.4 09/27/2019 1023   ALKPHOS  79 09/27/2019 1023   AST 21 09/27/2019 1023   ALT 5 09/27/2019 1023   BILITOT 1.0 09/27/2019 1023       Lab Results  Component Value Date   WBC 9.5 09/27/2019   HGB 13.7 09/27/2019   HCT 41.5 09/27/2019   MCV 89.4 09/27/2019   PLT 232 09/27/2019    Lab Results  Component Value Date   TSH 0.48 07/22/2017     Total time spent on today's visit was *** minutes, including both face-to-face time and nonface-to-face time.  Time included that spent on review of records (prior notes available to me/labs/imaging if pertinent), discussing treatment and goals, answering patient's questions and coordinating care.  Cc:  [name], MD

## 2021-03-15 ENCOUNTER — Ambulatory Visit: Payer: PPO | Admitting: Neurology

## 2021-03-27 DIAGNOSIS — I129 Hypertensive chronic kidney disease with stage 1 through stage 4 chronic kidney disease, or unspecified chronic kidney disease: Secondary | ICD-10-CM | POA: Diagnosis not present

## 2021-03-27 DIAGNOSIS — M19079 Primary osteoarthritis, unspecified ankle and foot: Secondary | ICD-10-CM | POA: Diagnosis not present

## 2021-03-27 DIAGNOSIS — I252 Old myocardial infarction: Secondary | ICD-10-CM | POA: Diagnosis not present

## 2021-03-27 DIAGNOSIS — M179 Osteoarthritis of knee, unspecified: Secondary | ICD-10-CM | POA: Diagnosis not present

## 2021-03-27 DIAGNOSIS — K219 Gastro-esophageal reflux disease without esophagitis: Secondary | ICD-10-CM | POA: Diagnosis not present

## 2021-03-27 DIAGNOSIS — E7800 Pure hypercholesterolemia, unspecified: Secondary | ICD-10-CM | POA: Diagnosis not present

## 2021-03-27 DIAGNOSIS — G20 Parkinson's disease: Secondary | ICD-10-CM | POA: Diagnosis not present

## 2021-03-27 DIAGNOSIS — M810 Age-related osteoporosis without current pathological fracture: Secondary | ICD-10-CM | POA: Diagnosis not present

## 2021-03-27 DIAGNOSIS — N183 Chronic kidney disease, stage 3 unspecified: Secondary | ICD-10-CM | POA: Diagnosis not present

## 2021-03-27 DIAGNOSIS — I2510 Atherosclerotic heart disease of native coronary artery without angina pectoris: Secondary | ICD-10-CM | POA: Diagnosis not present

## 2021-03-27 DIAGNOSIS — M152 Bouchard's nodes (with arthropathy): Secondary | ICD-10-CM | POA: Diagnosis not present

## 2021-03-27 DIAGNOSIS — I10 Essential (primary) hypertension: Secondary | ICD-10-CM | POA: Diagnosis not present

## 2021-04-12 ENCOUNTER — Other Ambulatory Visit: Payer: Self-pay | Admitting: Neurology

## 2021-04-12 DIAGNOSIS — L821 Other seborrheic keratosis: Secondary | ICD-10-CM | POA: Diagnosis not present

## 2021-04-12 DIAGNOSIS — D692 Other nonthrombocytopenic purpura: Secondary | ICD-10-CM | POA: Diagnosis not present

## 2021-04-12 DIAGNOSIS — L820 Inflamed seborrheic keratosis: Secondary | ICD-10-CM | POA: Diagnosis not present

## 2021-04-12 DIAGNOSIS — D229 Melanocytic nevi, unspecified: Secondary | ICD-10-CM | POA: Diagnosis not present

## 2021-04-12 DIAGNOSIS — L570 Actinic keratosis: Secondary | ICD-10-CM | POA: Diagnosis not present

## 2021-04-12 DIAGNOSIS — L111 Transient acantholytic dermatosis [Grover]: Secondary | ICD-10-CM | POA: Diagnosis not present

## 2021-04-12 DIAGNOSIS — D1801 Hemangioma of skin and subcutaneous tissue: Secondary | ICD-10-CM | POA: Diagnosis not present

## 2021-04-12 DIAGNOSIS — L814 Other melanin hyperpigmentation: Secondary | ICD-10-CM | POA: Diagnosis not present

## 2021-04-12 DIAGNOSIS — Z85828 Personal history of other malignant neoplasm of skin: Secondary | ICD-10-CM | POA: Diagnosis not present

## 2021-04-16 ENCOUNTER — Other Ambulatory Visit: Payer: Self-pay | Admitting: Neurology

## 2021-04-16 ENCOUNTER — Telehealth: Payer: Self-pay | Admitting: Neurology

## 2021-04-16 NOTE — Telephone Encounter (Signed)
New message  Upcoming appt on  5.17.2022   1. Which medications need to be refilled? (please list name of each medication and dose if known) carbidopa-levodopa (SINEMET IR) 25-100 MG tablet  2. Which pharmacy/location (including street and city if local pharmacy) is medication to be sent to?[place], [address]  3. Do they need a 30 day or 90 day supply? 90 day supply

## 2021-04-16 NOTE — Telephone Encounter (Signed)
Rx has been sent  

## 2021-04-19 NOTE — Progress Notes (Signed)
Assessment/Plan:   1.  Parkinsons Disease  -Continue carbidopa/levodopa 25/100, 1 tablet 3 times per day.  Discussed proper timing of medication.  -discussed PT again.  She lives at [facility]. Order given to patient   Subjective:   Taylor Mendoza was seen today in follow up for Parkinsons disease.  My previous records were reviewed prior to todays visit as well as outside records available to me.  Pt with daughter who supplements the history.   Patient was in the emergency room March 28 after a fall.  She had an anterior dislocation of the shoulder, which was reduced in the emergency room. She had an additional fall where she was trying to help husband walk and he took her down and they both fell and he fell and she fell on him.   Pt denies lightheadedness, near syncope.  No hallucinations.   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  [facility], but she feels that she cannot leave him even to go to physical therapy (although she does have some caregiving in the home a few days per week).  He is actually in skilled nursing right now due to fall.  They arent sure if this will be a permanent issue.  Current prescribed movement disorder medications: Carbidopa/levodopa 25/100, 1 tablet 3 times per day    ALLERGIES:   Allergies  Allergen Reactions  . Amlodipine Anaphylaxis, Swelling and Other (See Comments)    Edema  . Alendronate Other (See Comments)    Dysphagia  . Ramipril Cough    CURRENT MEDICATIONS:  Outpatient Encounter Medications as of 04/24/2021  Medication Sig  . acetaminophen (TYLENOL) 500 MG tablet Take 1,000 mg by mouth at bedtime.  Marland Kitchen atorvastatin (LIPITOR) 10 MG tablet Take 10 mg by mouth at bedtime.  . metoprolol succinate (TOPROL-XL) 50 MG 24 hr tablet Take 50 mg by mouth daily.  . nitroGLYCERIN (NITROSTAT) 0.4 MG SL tablet Place 1 tablet (0.4 mg total) under the tongue every 5 (five) minutes as  needed for chest pain.  . pantoprazole (PROTONIX) 40 MG tablet Take 1 tablet by mouth daily.  . polyethylene glycol (MIRALAX / GLYCOLAX) packet Take 17 g by mouth daily as needed for mild constipation (MIX AND DRINK).  Marland Kitchen triamcinolone cream (KENALOG) 0.1 % Apply 1 application topically See admin instructions. Apply to affected areas once daily  . [DISCONTINUED] carbidopa-levodopa (SINEMET IR) 25-100 MG tablet TAKE 1 TABLET BY MOUTH 3 TIMES A DAY.  . carbidopa-levodopa (SINEMET IR) 25-100 MG tablet Take 1 tablet by mouth 3 (three) times daily.   No facility-administered encounter medications on file as of 04/24/2021.    Objective:   PHYSICAL EXAMINATION:    VITALS:   Vitals:   04/24/21 1300  BP: (!) 142/72  Pulse: 67  SpO2: 96%  Weight: 126 lb (57.2 kg)  Height: 5\' 6"  (1.676 m)    GEN:  The patient appears stated age and is in NAD. HEENT:  Normocephalic, atraumatic.  The mucous membranes are moist. The superficial temporal arteries are without ropiness or tenderness. CV:  RRR Lungs:  CTAB Neck/HEME:  There are no carotid bruits bilaterally.  Neurological examination:  Orientation: The patient is alert and oriented x3. Cranial nerves: There is good facial symmetry with min facial hypomimia. The speech is fluent and clear. Soft palate rises symmetrically and there is no tongue deviation. Hearing is intact to conversational tone. Sensation: Sensation  is intact to light touch throughout Motor: Strength is at least antigravity x4.  Movement examination: Tone: There is mild tone in the left upper extremity Abnormal movements: none Coordination:  There is min decremation with RAM's, with mild foot taps on the L Gait and Station: Patient pushes off of the chair to arise.  The patient's stride length is slightly decreased but she is wide based.    I have reviewed and interpreted the following labs independently    Chemistry      Component Value Date/Time   NA 135 09/27/2019 1023    K 3.5 09/27/2019 1023   CL 99 09/27/2019 1023   CO2 25 09/27/2019 1023   BUN 31 (H) 09/27/2019 1023   CREATININE 1.62 (H) 09/27/2019 1023   CREATININE 1.30 (H) 07/22/2017 1448      Component Value Date/Time   CALCIUM 9.4 09/27/2019 1023   ALKPHOS 79 09/27/2019 1023   AST 21 09/27/2019 1023   ALT 5 09/27/2019 1023   BILITOT 1.0 09/27/2019 1023       Lab Results  Component Value Date   WBC 9.5 09/27/2019   HGB 13.7 09/27/2019   HCT 41.5 09/27/2019   MCV 89.4 09/27/2019   PLT 232 09/27/2019    Lab Results  Component Value Date   TSH 0.48 07/22/2017     Total time spent on today's visit was 25 minutes, including both face-to-face time and nonface-to-face time.  Time included that spent on review of records (prior notes available to me/labs/imaging if pertinent), discussing treatment and goals, answering patient's questions and coordinating care.  Cc:  [name], MD

## 2021-04-24 ENCOUNTER — Ambulatory Visit: Payer: PPO | Admitting: Neurology

## 2021-04-24 ENCOUNTER — Encounter: Payer: Self-pay | Admitting: Neurology

## 2021-04-24 ENCOUNTER — Other Ambulatory Visit: Payer: Self-pay

## 2021-04-24 VITALS — BP 142/72 | HR 67 | Ht 66.0 in | Wt 126.0 lb

## 2021-04-24 DIAGNOSIS — G20 Parkinson's disease: Secondary | ICD-10-CM | POA: Diagnosis not present

## 2021-04-24 MED ORDER — CARBIDOPA-LEVODOPA 25-100 MG PO TABS: 1.0000 | ORAL_TABLET | Freq: Three times a day (TID) | ORAL | 1 refills | Status: DC

## 2021-04-24 NOTE — Patient Instructions (Signed)
Online Resources for Power over Parkinson's Group May 2022  . Local [place] Online Groups  o Power over Parkinson's Group :   - Power Over Parkinson's Patient Education Group will be Wednesday, May 11th at 2pm via Zoom.   - Upcoming Power over Parkinson's Meetings:  2nd Wednesdays of the month at 2 pm:  June 8th, July 13th - Contact Amy Marriott at [email or web address] if interested in participating in this online group o Parkinson's Care Partners Group:    3rd Mondays, Contact Misty Paladino o Atypical Parkinsonian Patient Group:   4th Wednesdays, Contact Misty Paladino o If you are interested in participating in these online groups with Misty, please contact her directly for how to join those meetings.  Her contact information is [email or web address].   . Parkinson Foundation:  www.parkinson.org o PD Health at Home continues:  Mindfulness Mondays, Expert Briefing Tuesdays, Wellness Wednesdays, Take Time Thursdays, Fitness Fridays -Listings for May 2022 are on the website o Upcoming Webinar:  Newly Diagnosed Building a Better Life with Parkinson   Wednesday, May 18th @ 1 pm o Register for expert briefings (webinars) at [email or web address] o  Please check out their website to sign up for emails and see their full online offerings  . [employer] Foundation:  www.[email or web address]  o Upcoming Webinar:   What's in your DNA, understanding Parkinson's genetics.  Thursday, May 19th @ 12 noon o Check out additional information on their website to see their full online offerings  . [employer] Phinney Foundation:  www.davisphinneyfoundation.org o Upcoming Webinar:  Stay tuned o Care Partner Monthly Meetup.  With Connie Carpenter Phinney.  First Tuesday of each month, 2 pm o Joy Breaks:  First Wednesday of each month, 2-3 pm. There will be art, doodling, making, crafting, listening, laughing, stories, and everything in between. No art experience necessary. No supplies  required. Just show up for joy!  Register on their website. o Check out additional information to Live Well Today on their website  . Parkinson and Movement Disorders (PMD) Alliance:  www.pmdalliance.org o [employer]:  Online Education Events o Sign up for emails, which are sent weekly to give you updates on programming and online offerings     . Parkinson's Association of [place]:  www.parkinsonassociation.org o Information on online support groups, education events, and online exercises including Yoga, Parkinson's exercises and more-LOTS of information on links to PD resources and online events o Virtual Support Group through Parkinson's Association of [place]; next one is scheduled for Wednesday, May 4th, 2022 at 2 pm. (These are typically scheduled for the 1st Wednesday of the month at 2 pm).  Visit website for details.  . Additional links for movement activities: o PWR! Moves Classes at [facility] HAVE RESUMED!  Wednesdays 10 and 11 am.  Contact Amy Marriott, PT [email or web address] or [phone] if interested o Here is a link to the PWR!Moves classes on Zoom from [place] Parkinson's Foundation - Daily Mon-Sat at 10:00. Via Zoom, FREE and open to all.  There is also a link below via Facebook if you use that platform. - https://www.parkinsonsmi.org/mpf-programs/exercise-and-movement-activities - https://www.facebook.com/ParkinsonsMI.org/posts/pwr-moves-exercise-class-parkinson-wellness-recovery-online-with-angee-ludwa-pt-/10156827878021813/ o Parkinson's Wellness Recovery (PWR! Moves)  www.pwr4life.org - Info on the PWR! Virtual Experience:  You will have access to our expertise through self-assessment, guided plans that start with the PD-specific fundamentals, educational content, tips, Q&A with an expert, and a growing library of PD-specific pre-recorded and live exercise classes of varying types and intensity - both physical and cognitive! If  that is not enough, we   offer 1:1 wellness consultations (in-person or virtual) to personalize your PWR! Virtual Experience.  - Check out the PWR! Move of the month on the Parkinson Wellness Recovery website:  https://www.pwr4life.org/pwr-move-of-the-month-4/ o Parkinson Foundation Fitness Fridays:  - As part of the PD Health @ Home program, this free video series focuses each week on one aspect of fitness designed to support people living with Parkinson's.  These weekly videos highlight the Parkinson Foundation recent fitness guidelines for people with Parkinson's disease. -  www.parkinson.org/understanding-parkinsons/coronavirus/PD-health-at-home/Fitness-Fridays o Dance for PD website is offering free, live-stream classes throughout the week, as well as links to digital library of classes:  https://danceforparkinsons.org/ o Dance for Parkinson's Class:  [employer] [place].  Free offering for people with Parkinson's and care partners; virtual class.  o For more information, contact [phone] or email Magalli Morana at [email or web address] o Virtual dance and Pilates for Parkinson's classes: Click on the Community Tab> Parkinson's Movement Initiative Tab.  To register for classes and for more information, visit www.americandancefestival.org and click the "community" tab.     o [employer] Parkinson's Cycling Classes  - [employer]: 1pm on Fridays-Live classes at [employer] (Contact Margaret Hazen at [email or web address] or [phone]) - [employer]: Virtual Classes Mondays and Thursdays /Live classes Tuesday, Wednesday and Thursday (contact Marlee at [email or web address]  or [phone])  o [place] [employer] - Three levels of classes are offered Tuesdays and Thursdays:  10:30 am,  12 noon & 1:45 pm at [facility] [facility].  - Active Stretching with Maria, New Class starting in March, on Fridays - To observe a class or for  more information,  call [phone] or email [email or web address] . [facility] [employer]: o Online Caregiver Education Opportunities:  www.well-springsolutions.org/caregiver-education/caregiver-support-group.  You may also contact Jodi Kolada at [email or web address] or [phone].   o [facility] Caregivers! Thursday, May 12th 10:00a-1:30p [place]  [facility], [place] [facility], [address], [place] . You may contact Jodi Kolada at [email or web address] or [phone].   o [facility] Navigator:  Just1Navigator program, a free service to help individuals and families through the journey of determining care for older adults.  The "Navigator" is a social worker, Nicole Reynolds, who will speak with a prospective client and/or loved ones to provide an assessment of the situation and a set of recommendations for a personalized care plan -- all free of charge, and whether [facility] offers the needed service or not. If the need is not a service we provide, we are well-connected with reputable programs in town that we can refer you to.  www.well-springsolutions.org or to speak with the Navigator, call [phone].     

## 2021-07-01 DIAGNOSIS — S9031XA Contusion of right foot, initial encounter: Secondary | ICD-10-CM | POA: Diagnosis not present

## 2021-07-04 DIAGNOSIS — I2510 Atherosclerotic heart disease of native coronary artery without angina pectoris: Secondary | ICD-10-CM | POA: Diagnosis not present

## 2021-07-04 DIAGNOSIS — N183 Chronic kidney disease, stage 3 unspecified: Secondary | ICD-10-CM | POA: Diagnosis not present

## 2021-07-04 DIAGNOSIS — M810 Age-related osteoporosis without current pathological fracture: Secondary | ICD-10-CM | POA: Diagnosis not present

## 2021-07-04 DIAGNOSIS — K219 Gastro-esophageal reflux disease without esophagitis: Secondary | ICD-10-CM | POA: Diagnosis not present

## 2021-07-04 DIAGNOSIS — M19079 Primary osteoarthritis, unspecified ankle and foot: Secondary | ICD-10-CM | POA: Diagnosis not present

## 2021-07-04 DIAGNOSIS — M152 Bouchard's nodes (with arthropathy): Secondary | ICD-10-CM | POA: Diagnosis not present

## 2021-07-04 DIAGNOSIS — M179 Osteoarthritis of knee, unspecified: Secondary | ICD-10-CM | POA: Diagnosis not present

## 2021-07-04 DIAGNOSIS — I129 Hypertensive chronic kidney disease with stage 1 through stage 4 chronic kidney disease, or unspecified chronic kidney disease: Secondary | ICD-10-CM | POA: Diagnosis not present

## 2021-07-04 DIAGNOSIS — E7800 Pure hypercholesterolemia, unspecified: Secondary | ICD-10-CM | POA: Diagnosis not present

## 2021-07-04 DIAGNOSIS — I10 Essential (primary) hypertension: Secondary | ICD-10-CM | POA: Diagnosis not present

## 2021-07-04 DIAGNOSIS — G20 Parkinson's disease: Secondary | ICD-10-CM | POA: Diagnosis not present

## 2021-07-04 DIAGNOSIS — I252 Old myocardial infarction: Secondary | ICD-10-CM | POA: Diagnosis not present

## 2021-07-30 DIAGNOSIS — N1832 Chronic kidney disease, stage 3b: Secondary | ICD-10-CM | POA: Diagnosis not present

## 2021-07-30 DIAGNOSIS — G20 Parkinson's disease: Secondary | ICD-10-CM | POA: Diagnosis not present

## 2021-07-30 DIAGNOSIS — I2510 Atherosclerotic heart disease of native coronary artery without angina pectoris: Secondary | ICD-10-CM | POA: Diagnosis not present

## 2021-07-30 DIAGNOSIS — Z1389 Encounter for screening for other disorder: Secondary | ICD-10-CM | POA: Diagnosis not present

## 2021-07-30 DIAGNOSIS — K219 Gastro-esophageal reflux disease without esophagitis: Secondary | ICD-10-CM | POA: Diagnosis not present

## 2021-07-30 DIAGNOSIS — E7800 Pure hypercholesterolemia, unspecified: Secondary | ICD-10-CM | POA: Diagnosis not present

## 2021-07-30 DIAGNOSIS — Z0000 Encounter for general adult medical examination without abnormal findings: Secondary | ICD-10-CM | POA: Diagnosis not present

## 2021-07-30 DIAGNOSIS — E0590 Thyrotoxicosis, unspecified without thyrotoxic crisis or storm: Secondary | ICD-10-CM | POA: Diagnosis not present

## 2021-07-30 DIAGNOSIS — I700 Atherosclerosis of aorta: Secondary | ICD-10-CM | POA: Diagnosis not present

## 2021-07-30 DIAGNOSIS — I129 Hypertensive chronic kidney disease with stage 1 through stage 4 chronic kidney disease, or unspecified chronic kidney disease: Secondary | ICD-10-CM | POA: Diagnosis not present

## 2021-08-16 ENCOUNTER — Encounter: Payer: Self-pay | Admitting: Neurology

## 2021-10-30 ENCOUNTER — Ambulatory Visit: Payer: PPO | Admitting: Neurology

## 2021-12-19 NOTE — Progress Notes (Deleted)
Assessment/Plan:   1.  Parkinsons Disease  -Continue carbidopa/levodopa 25/100, 1 tablet 3 times per day.     Subjective:   Taylor Mendoza was seen today in follow up for Parkinsons disease.  My previous records were reviewed prior to todays visit as well as outside records available to me.  Pt with daughter who supplements the history.   Pt denies falls.  [facility] and we gave her a RX for PT there last visit.  She reports that ***.  Pt denies lightheadedness, near syncope.  No hallucinations.  Mood has been good.    Current prescribed movement disorder medications: Carbidopa/levodopa 25/100, 1 tablet 3 times per day    ALLERGIES:   Allergies  Allergen Reactions   Amlodipine Anaphylaxis, Swelling and Other (See Comments)    Edema   Alendronate Other (See Comments)    Dysphagia   Ramipril Cough    CURRENT MEDICATIONS:  Outpatient Encounter Medications as of 12/20/2021  Medication Sig   acetaminophen (TYLENOL) 500 MG tablet Take 1,000 mg by mouth at bedtime.   atorvastatin (LIPITOR) 10 MG tablet Take 10 mg by mouth at bedtime.   carbidopa-levodopa (SINEMET IR) 25-100 MG tablet Take 1 tablet by mouth 3 (three) times daily.   metoprolol succinate (TOPROL-XL) 50 MG 24 hr tablet Take 50 mg by mouth daily.   nitroGLYCERIN (NITROSTAT) 0.4 MG SL tablet Place 1 tablet (0.4 mg total) under the tongue every 5 (five) minutes as needed for chest pain.   pantoprazole (PROTONIX) 40 MG tablet Take 1 tablet by mouth daily.   polyethylene glycol (MIRALAX / GLYCOLAX) packet Take 17 g by mouth daily as needed for mild constipation (MIX AND DRINK).   triamcinolone cream (KENALOG) 0.1 % Apply 1 application topically See admin instructions. Apply to affected areas once daily   No facility-administered encounter medications on file as of 12/20/2021.    Objective:   PHYSICAL EXAMINATION:    VITALS:   There were no vitals filed for this visit.   GEN:  The patient appears stated  age and is in NAD. HEENT:  Normocephalic, atraumatic.  The mucous membranes are moist. The superficial temporal arteries are without ropiness or tenderness. CV:  RRR Lungs:  CTAB Neck/HEME:  There are no carotid bruits bilaterally.  Neurological examination:  Orientation: The patient is alert and oriented x3. Cranial nerves: There is good facial symmetry with min facial hypomimia. The speech is fluent and clear. Soft palate rises symmetrically and there is no tongue deviation. Hearing is intact to conversational tone. Sensation: Sensation is intact to light touch throughout Motor: Strength is at least antigravity x4.  Movement examination: Tone: There is mild tone in the left upper extremity Abnormal movements: none Coordination:  There is min decremation with RAM's, with mild foot taps on the L Gait and Station: Patient pushes off of the chair to arise.  The patient's stride length is slightly decreased but she is wide based.    I have reviewed and interpreted the following labs independently    Chemistry      Component Value Date/Time   NA 135 09/27/2019 1023   K 3.5 09/27/2019 1023   CL 99 09/27/2019 1023   CO2 25 09/27/2019 1023   BUN 31 (H) 09/27/2019 1023   CREATININE 1.62 (H) 09/27/2019 1023   CREATININE 1.30 (H) 07/22/2017 1448      Component Value Date/Time   CALCIUM 9.4 09/27/2019 1023   ALKPHOS 79 09/27/2019 1023  AST 21 09/27/2019 1023   ALT 5 09/27/2019 1023   BILITOT 1.0 09/27/2019 1023       Lab Results  Component Value Date   WBC 9.5 09/27/2019   HGB 13.7 09/27/2019   HCT 41.5 09/27/2019   MCV 89.4 09/27/2019   PLT 232 09/27/2019    Lab Results  Component Value Date   TSH 0.48 07/22/2017     Total time spent on today's visit was *** minutes, including both face-to-face time and nonface-to-face time.  Time included that spent on review of records (prior notes available to me/labs/imaging if pertinent), discussing treatment and goals, answering  patient's questions and coordinating care.  Cc:  [name], MD

## 2021-12-20 ENCOUNTER — Ambulatory Visit: Payer: PPO | Admitting: Neurology

## 2022-01-04 ENCOUNTER — Other Ambulatory Visit: Payer: Self-pay | Admitting: Neurology

## 2022-01-07 ENCOUNTER — Other Ambulatory Visit: Payer: Self-pay

## 2022-01-07 ENCOUNTER — Telehealth: Payer: Self-pay | Admitting: Neurology

## 2022-01-07 MED ORDER — CARBIDOPA-LEVODOPA 25-100 MG PO TABS: 1.0000 | ORAL_TABLET | Freq: Three times a day (TID) | ORAL | 0 refills | Status: DC

## 2022-01-07 NOTE — Telephone Encounter (Signed)
1. Which medications need refilled? (List name and dosage, if known) carbidopa-levodopa  2. Which pharmacy/location is medication to be sent to? (include street and city if local pharmacy) [employer] [facility]  Pt is sch for a follow up next on 02/27/22

## 2022-02-25 NOTE — Progress Notes (Signed)
? ? ?Assessment/Plan:  ? ?1.  Parkinsons Disease ? -Continue carbidopa/levodopa 25/100, 1 tablet 3 times per day.   ? ? ?Subjective:  ? ?Taylor Mendoza was seen today in follow up for Parkinsons disease.  My previous records were reviewed prior to todays visit as well as outside records available to me.  Pt with daughter who supplements the history.    I have not seen her in quite some time (last May).  She had an appointment in January, but missed that appointment.  Pt denies falls.  [facility] and [facility] her a RX for PT there last visit.  She reports that she didn't do that.  She spends a lot of time with her husband in the SNF part and didn't feel that she had time.  She will ride a scooter to the SNF part to visit her husband (its a long walk).  Pt denies lightheadedness, near syncope.  No hallucinations.  Mood has been good.   ? ?Current prescribed movement disorder medications: ?Carbidopa/levodopa 25/100, 1 tablet 3 times per day ? ? ? ?ALLERGIES:   ?Allergies  ?Allergen Reactions  ? Amlodipine Anaphylaxis, Swelling and Other (See Comments)  ?  Edema  ? Alendronate Other (See Comments)  ?  Dysphagia  ? Ramipril Cough  ? ? ?CURRENT MEDICATIONS:  ?Outpatient Encounter Medications as of 02/27/2022  ?Medication Sig  ? acetaminophen (TYLENOL) 500 MG tablet Take 1,000 mg by mouth at bedtime.  ? atorvastatin (LIPITOR) 10 MG tablet Take 10 mg by mouth at bedtime.  ? carbidopa-levodopa (SINEMET IR) 25-100 MG tablet Take 1 tablet by mouth 3 (three) times daily.  ? metoprolol succinate (TOPROL-XL) 50 MG 24 hr tablet Take 50 mg by mouth daily.  ? pantoprazole (PROTONIX) 40 MG tablet Take 1 tablet by mouth daily.  ? polyethylene glycol (MIRALAX / GLYCOLAX) packet Take 17 g by mouth daily as needed for mild constipation (MIX AND DRINK).  ? triamcinolone cream (KENALOG) 0.1 % Apply 1 application topically See admin instructions. Apply to affected areas once daily  ? nitroGLYCERIN (NITROSTAT) 0.4 MG SL tablet  Place 1 tablet (0.4 mg total) under the tongue every 5 (five) minutes as needed for chest pain.  ? ?No facility-administered encounter medications on file as of 02/27/2022.  ? ? ?Objective:  ? ?PHYSICAL EXAMINATION:   ? ?VITALS:   ?Vitals:  ? 02/27/22 0915  ?BP: 119/72  ?Pulse: 72  ?SpO2: 97%  ?Weight: 120 lb 3.2 oz (54.5 kg)  ?Height: '5\' 6"'$  (1.676 m)  ? ?Wt Readings from Last 3 Encounters:  ?02/27/22 120 lb 3.2 oz (54.5 kg)  ?04/24/21 126 lb (57.2 kg)  ?03/05/21 136 lb (61.7 kg)  ? ? ? ? ?GEN:  The patient appears stated age and is in NAD. ?HEENT:  Normocephalic, atraumatic.  The mucous membranes are moist. The superficial temporal arteries are without ropiness or tenderness. ?CV:  RRR ?Lungs:  CTAB ?Neck/HEME:  There are no carotid bruits bilaterally. ? ?Neurological examination: ? ?Orientation: The patient is alert and oriented x3. ?Cranial nerves: There is good facial symmetry with min facial hypomimia. The speech is fluent and clear. Soft palate rises symmetrically and there is no tongue deviation. Hearing is intact to conversational tone. ?Sensation: Sensation is intact to light touch throughout ?Motor: Strength is at least antigravity x4. ? ?Movement examination: ?Tone: There is normal tone today ?Abnormal movements: none ?Coordination:  There is no decremation today ?Gait and Station: Patient pushes off of the chair to  arise.  The patient's stride length is slightly decreased but she is wide based.  She has a valgus knee deformity ? ? ? ?Cc:  Taylor Orn, MD ? ?

## 2022-02-27 ENCOUNTER — Other Ambulatory Visit: Payer: Self-pay

## 2022-02-27 ENCOUNTER — Encounter: Payer: Self-pay | Admitting: Neurology

## 2022-02-27 ENCOUNTER — Ambulatory Visit: Payer: PPO | Admitting: Neurology

## 2022-02-27 VITALS — BP 119/72 | HR 72 | Ht 66.0 in | Wt 120.2 lb

## 2022-02-27 DIAGNOSIS — G20 Parkinson's disease: Secondary | ICD-10-CM | POA: Diagnosis not present

## 2022-02-27 MED ORDER — CARBIDOPA-LEVODOPA 25-100 MG PO TABS: 1.0000 | ORAL_TABLET | Freq: Three times a day (TID) | ORAL | 1 refills | Status: DC

## 2022-02-27 NOTE — Patient Instructions (Signed)
Local and Online Resources for Power over Parkinson's Group ?March 2023 ? ?LOCAL [place] PARKINSON'S GROUPS  ?Power over Parkinson's Group :   ?Power Over Parkinson's Patient Education Group will be Wednesday, March 8th-*Hybrid meting*- in person at [place] location and via [facility] at 2:00 pm.   ?Upcoming Power over Parkinson's Meetings:  2nd Wednesdays of the month at 2 pm:  March 8th, April 12th ?Contact Amy Marriott at [email or web address] if interested in participating in this group ?Parkinson's Care Partners Group:    3rd Mondays, Contact Misty Paladino ?Atypical Parkinsonian Patient Group:   4th Wednesdays, Contact Misty Paladino ?If you are interested in participating in these groups with Misty, please contact her directly for how to join those meetings.  Her contact information is [email or web address].   ? ?LOCAL EVENTS AND NEW OFFERINGS ?Parkinson's Wellness Event:  Pottery Night at [facility] [place].  Friday, March 10th 5:30-7:30 pm.  Sponsored by [employer] [place].  FREE event for people with Parkinson's and care partners.  RSVP to [facility] at [email or web address] ?Dine out at [employer].  Celebrate Parkinson's disease Awareness Month and Support the Parkinson's Movement Disorder Fund.   Wednesday, April 19th 4-6 pm at [employer], [employer].  (Give receipt to cashier and 20% will be donated) ?Parkinson's T-shirts for sale!  Designed by a local group member, with funds going to [place].  $20.00  Contact Misty to purchase (see email above) ?New PWR! Moves Class offering at [employer]!  Fridays 1-2 pm in March (likely to switch days and times after March).  Come try it out and see if PWR! Moves is a good fit for your exercise routine!  Contact Amy Marriott for details:  [email or web address] ?Hamil-Kerr Challenge Bike, Run, Walk for PD, PSP, MSA.    Saturday, April 8th at 8 am.  [place]   Proceeds go to offset costs of exercise programs locally.  To Register, visit website:  www.[email or web address] ? ?ONLINE EDUCATION AND SUPPORT ?[place]:  www.[email or web address] ?PD Health at Home continues:  Mindfulness Mondays, Expert Briefing Tuesdays, Wellness Wednesdays, Take Time Thursdays, Fitness Fridays  ?Upcoming Education:  ?Parkinson's and Medications:  What's New.  Wednesday, March 8th at 1:00 pm. ?Freezing and Fall Prevention in Parkinson's.  Wednesday, April 12th at 1:00 pm ?Register for Armed forces operational officer) at [email or web address].si ?[place] Chapter Parkinson's Symposium: Cognition Changes- a free in-person ([place], [place]) and online Audiological scientist) event for people with Parkinson's and their loved ones. During this event we will explore new treatments and practical strategies for addressing these changes.  Saturday, April 1st, 10 am-1 pm.  Register at [employer].[email or web address]/ or contact [place] at [phone] or [email or web address]. ? Please check out their website to sign up for emails and see their full online offerings ? ?[place]:  [email or web address]  ?Third Thursday Webinars:  On the third Thursday of every month at 12 p.m. ET, join our free live webinars to learn about various aspects of living with Parkinson's disease and our work to speed medical breakthroughs. ?Upcoming Webinar:  The Power of Women in Philanthropy.  Tues, March 28th at  12 pm. ?Check out additional information on their website to see their full online offerings ? ?[place]:  www.[email or web address] ?Upcoming Webinar:   Stay tuned ?Webinar Series:  Living with Parkinson's Meetup.   Third Thursdays of each month at 3 pm ?Care Partner Monthly Meetup.  With Robin Searing [place].  First Tuesday of each month, 2 pm ?Check out additional information  to Live Well Today on their  website ? ?Parkinson and Movement Disorders (PMD) Alliance:  www.pmdalliance.org ?[employer] Online:  Online Education Events ?Sign up for emails, which are sent weekly to give you updates on programming and online offerings ? ?Parkinson's Association of the [place]:  www.parkinsonassociation.org ?Information on online support groups, education events, and online exercises including Yoga, Parkinson's exercises and more-LOTS of information on links to PD resources and online events ?Virtual Support Group through [employer] of the [place]; next one is scheduled for Wednesday, *April 5th at 2 pm. *March meeting has been cancelled. (These are typically scheduled for the 1st Wednesday of the month at 2 pm).  Visit website for details. ?MOVEMENT AND EXERCISE OPPORTUNITIES ?Parkinson's DRUMMING Classes/Music Therapy with Doylene Canning:  This is a returning class and it's FREE!  2nd Mondays, continuing March 13th , 11:00 at the [place].  Contact *Misty Taylor-Paladino at [email or web address] or Doylene Canning at [phone] or [email or web address]  ?PWR! Moves Classes at [place].  Wednesdays 10 and 11 am.   NEW PWR! Moves Class offering at [employer].  Fridays 1-2 pm.  Contact Amy Marriott, PT [email or web address] if interested. ?Here is a link to the PWR!Moves classes on Zoom from [place] - Daily Mon-Sat at 10:00. Via Zoom, FREE and open to all.  There is also a link below via Facebook if you use that platform. ?[email or web address].si ?[email or web address] ? ?Parkinson's Wellness Recovery (PWR! Moves)  www.[email or web address] ?Info on the PWR! Virtual Experience:  You will have access to our expertise through  self-assessment, guided plans that start with the PD-specific fundamentals, educational content, tips, Q&A with an expert, and a growing Art therapist of PD-specific pre-recorded and live exercise classes of varying types and intensity - both physical and cognitive! If that is not enough, we offer 1:1 wellness consultations (in-person or virtual) to personalize your PWR! Research scientist (medical).  ?[employer] Fridays:  ?As part of the PD Health @ Home program, this free video series focuses each week on one aspect of fitness designed to support people living with Parkinson's.  These weekly videos highlight the [place] recent fitness guidelines for people with Parkinson's disease. ?www.KVTVnet.com.cy ?Dance for PD website is offering free, live-stream classes throughout the week, as well as links to [employer] of classes:  https://[email or web address]/ ?Dance for Parkinson's in-person class.  February 1-April 26, Wednesdays 4-5 pm.  Free class for people with Parkinson's disease, at [address] [place], [place].  Contact [phone] or [email or web address] to register ?Virtual dance and Pilates for Parkinson's classes: Click on the Community Tab> Parkinson's Movement Initiative Tab.  To register for classes and for more information, visit www.[email or web address].co.za and click the ?community? tab.  ?[employer] Parkinson's Cycling Classes  ?[employer]:  Thursdays @ Noon-Live classes at [employer] ([employer] at [email or web address] or [phone]) ?[employer]: Virtual Classes Mondays and Thursdays Jeanette Caprice classes Tuesday, Wednesday and Thursday (contact [place] at [email or web address]  or [phone]) ?[employer] ?Varied levels of classes are offered Mondays, Tuesdays and Thursdays at [employer].  ?Stretching with Verdis Frederickson weekly class is also offered for people with  Parkinson's ?To observe a class or for more information, call [phone] or email Hezzie Bump at [email or web address] .[email or web address] ?ADDITIONAL SUPPORT AND RESOURCES ?[employer]:Online Caregiver Education Opportunities:

## 2022-04-24 DIAGNOSIS — I129 Hypertensive chronic kidney disease with stage 1 through stage 4 chronic kidney disease, or unspecified chronic kidney disease: Secondary | ICD-10-CM | POA: Diagnosis not present

## 2022-04-24 DIAGNOSIS — R609 Edema, unspecified: Secondary | ICD-10-CM | POA: Diagnosis not present

## 2022-04-24 DIAGNOSIS — N390 Urinary tract infection, site not specified: Secondary | ICD-10-CM | POA: Diagnosis not present

## 2022-07-03 ENCOUNTER — Emergency Department (HOSPITAL_COMMUNITY): Payer: PPO

## 2022-07-03 ENCOUNTER — Other Ambulatory Visit: Payer: Self-pay

## 2022-07-03 ENCOUNTER — Inpatient Hospital Stay (HOSPITAL_COMMUNITY): Payer: PPO | Admitting: Anesthesiology

## 2022-07-03 ENCOUNTER — Inpatient Hospital Stay (HOSPITAL_COMMUNITY)
Admission: EM | Admit: 2022-07-03 | Discharge: 2022-07-08 | DRG: 522 | Disposition: A | Discharge status: Skilled Nursing Facility | Payer: PPO | Source: Skilled Nursing Facility | Attending: Internal Medicine | Admitting: Internal Medicine

## 2022-07-03 DIAGNOSIS — D72829 Elevated white blood cell count, unspecified: Secondary | ICD-10-CM

## 2022-07-03 DIAGNOSIS — D696 Thrombocytopenia, unspecified: Secondary | ICD-10-CM

## 2022-07-03 DIAGNOSIS — I129 Hypertensive chronic kidney disease with stage 1 through stage 4 chronic kidney disease, or unspecified chronic kidney disease: Secondary | ICD-10-CM | POA: Diagnosis present

## 2022-07-03 DIAGNOSIS — S72001A Fracture of unspecified part of neck of right femur, initial encounter for closed fracture: Principal | ICD-10-CM | POA: Diagnosis present

## 2022-07-03 DIAGNOSIS — I252 Old myocardial infarction: Secondary | ICD-10-CM | POA: Diagnosis not present

## 2022-07-03 DIAGNOSIS — W010XXA Fall on same level from slipping, tripping and stumbling without subsequent striking against object, initial encounter: Secondary | ICD-10-CM | POA: Diagnosis present

## 2022-07-03 DIAGNOSIS — M25572 Pain in left ankle and joints of left foot: Secondary | ICD-10-CM | POA: Diagnosis not present

## 2022-07-03 DIAGNOSIS — Z955 Presence of coronary angioplasty implant and graft: Secondary | ICD-10-CM

## 2022-07-03 DIAGNOSIS — D649 Anemia, unspecified: Secondary | ICD-10-CM | POA: Diagnosis not present

## 2022-07-03 DIAGNOSIS — N1832 Chronic kidney disease, stage 3b: Secondary | ICD-10-CM | POA: Diagnosis present

## 2022-07-03 DIAGNOSIS — S81811A Laceration without foreign body, right lower leg, initial encounter: Secondary | ICD-10-CM | POA: Diagnosis not present

## 2022-07-03 DIAGNOSIS — Y92000 Kitchen of unspecified non-institutional (private) residence as  the place of occurrence of the external cause: Secondary | ICD-10-CM

## 2022-07-03 DIAGNOSIS — M25551 Pain in right hip: Secondary | ICD-10-CM | POA: Diagnosis not present

## 2022-07-03 DIAGNOSIS — I700 Atherosclerosis of aorta: Secondary | ICD-10-CM | POA: Diagnosis not present

## 2022-07-03 DIAGNOSIS — S79911A Unspecified injury of right hip, initial encounter: Secondary | ICD-10-CM | POA: Diagnosis not present

## 2022-07-03 DIAGNOSIS — G20 Parkinson's disease: Secondary | ICD-10-CM | POA: Diagnosis not present

## 2022-07-03 DIAGNOSIS — Z7401 Bed confinement status: Secondary | ICD-10-CM | POA: Diagnosis not present

## 2022-07-03 DIAGNOSIS — E876 Hypokalemia: Secondary | ICD-10-CM | POA: Diagnosis not present

## 2022-07-03 DIAGNOSIS — R2241 Localized swelling, mass and lump, right lower limb: Secondary | ICD-10-CM | POA: Diagnosis not present

## 2022-07-03 DIAGNOSIS — G8918 Other acute postprocedural pain: Secondary | ICD-10-CM | POA: Diagnosis not present

## 2022-07-03 DIAGNOSIS — M1611 Unilateral primary osteoarthritis, right hip: Secondary | ICD-10-CM | POA: Diagnosis not present

## 2022-07-03 DIAGNOSIS — Z79899 Other long term (current) drug therapy: Secondary | ICD-10-CM

## 2022-07-03 DIAGNOSIS — I2510 Atherosclerotic heart disease of native coronary artery without angina pectoris: Secondary | ICD-10-CM | POA: Diagnosis not present

## 2022-07-03 DIAGNOSIS — S72009A Fracture of unspecified part of neck of unspecified femur, initial encounter for closed fracture: Secondary | ICD-10-CM | POA: Diagnosis not present

## 2022-07-03 DIAGNOSIS — Z888 Allergy status to other drugs, medicaments and biological substances status: Secondary | ICD-10-CM | POA: Diagnosis not present

## 2022-07-03 DIAGNOSIS — Z96641 Presence of right artificial hip joint: Secondary | ICD-10-CM | POA: Diagnosis not present

## 2022-07-03 DIAGNOSIS — Z471 Aftercare following joint replacement surgery: Secondary | ICD-10-CM | POA: Diagnosis not present

## 2022-07-03 DIAGNOSIS — E785 Hyperlipidemia, unspecified: Secondary | ICD-10-CM | POA: Diagnosis present

## 2022-07-03 DIAGNOSIS — Z66 Do not resuscitate: Secondary | ICD-10-CM | POA: Diagnosis present

## 2022-07-03 DIAGNOSIS — W19XXXA Unspecified fall, initial encounter: Principal | ICD-10-CM

## 2022-07-03 DIAGNOSIS — M7989 Other specified soft tissue disorders: Secondary | ICD-10-CM | POA: Diagnosis not present

## 2022-07-03 DIAGNOSIS — I10 Essential (primary) hypertension: Secondary | ICD-10-CM | POA: Diagnosis not present

## 2022-07-03 DIAGNOSIS — I517 Cardiomegaly: Secondary | ICD-10-CM | POA: Diagnosis not present

## 2022-07-03 DIAGNOSIS — R531 Weakness: Secondary | ICD-10-CM | POA: Diagnosis not present

## 2022-07-03 DIAGNOSIS — R9431 Abnormal electrocardiogram [ECG] [EKG]: Secondary | ICD-10-CM | POA: Diagnosis not present

## 2022-07-03 DIAGNOSIS — N183 Chronic kidney disease, stage 3 unspecified: Secondary | ICD-10-CM

## 2022-07-03 LAB — CBC WITH DIFFERENTIAL/PLATELET
Abs Immature Granulocytes: 0.06 10*3/uL (ref 0.00–0.07)
Basophils Absolute: 0 10*3/uL (ref 0.0–0.1)
Basophils Relative: 0 %
Eosinophils Absolute: 0.1 10*3/uL (ref 0.0–0.5)
Eosinophils Relative: 1 %
HCT: 39.7 % (ref 36.0–46.0)
Hemoglobin: 13.3 g/dL (ref 12.0–15.0)
Immature Granulocytes: 1 %
Lymphocytes Relative: 8 %
Lymphs Abs: 0.8 10*3/uL (ref 0.7–4.0)
MCH: 29.2 pg (ref 26.0–34.0)
MCHC: 33.5 g/dL (ref 30.0–36.0)
MCV: 87.3 fL (ref 80.0–100.0)
Monocytes Absolute: 0.5 10*3/uL (ref 0.1–1.0)
Monocytes Relative: 4 %
Neutro Abs: 9.7 10*3/uL — ABNORMAL HIGH (ref 1.7–7.7)
Neutrophils Relative %: 86 %
Platelets: 210 10*3/uL (ref 150–400)
RBC: 4.55 MIL/uL (ref 3.87–5.11)
RDW: 13.5 % (ref 11.5–15.5)
WBC: 11.2 10*3/uL — ABNORMAL HIGH (ref 4.0–10.5)
nRBC: 0 % (ref 0.0–0.2)

## 2022-07-03 LAB — CREATININE, SERUM
Creatinine, Ser: 1.46 mg/dL — ABNORMAL HIGH (ref 0.44–1.00)
GFR, Estimated: 34 mL/min — ABNORMAL LOW (ref >60)

## 2022-07-03 LAB — BASIC METABOLIC PANEL
Anion gap: 10 (ref 5–15)
BUN: 55 mg/dL — ABNORMAL HIGH (ref 8–23)
CO2: 24 mmol/L (ref 22–32)
Calcium: 9.2 mg/dL (ref 8.9–10.3)
Chloride: 107 mmol/L (ref 98–111)
Creatinine, Ser: 1.72 mg/dL — ABNORMAL HIGH (ref 0.44–1.00)
GFR, Estimated: 28 mL/min — ABNORMAL LOW (ref >60)
Glucose, Bld: 98 mg/dL (ref 70–99)
Potassium: 3.6 mmol/L (ref 3.5–5.1)
Sodium: 141 mmol/L (ref 135–145)

## 2022-07-03 LAB — CBC
HCT: 37.4 % (ref 36.0–46.0)
Hemoglobin: 12.4 g/dL (ref 12.0–15.0)
MCH: 29.5 pg (ref 26.0–34.0)
MCHC: 33.2 g/dL (ref 30.0–36.0)
MCV: 89 fL (ref 80.0–100.0)
Platelets: 197 10*3/uL (ref 150–400)
RBC: 4.2 MIL/uL (ref 3.87–5.11)
RDW: 13.7 % (ref 11.5–15.5)
WBC: 9.3 10*3/uL (ref 4.0–10.5)
nRBC: 0 % (ref 0.0–0.2)

## 2022-07-03 LAB — TYPE AND SCREEN
ABO/RH(D): A NEG
Antibody Screen: NEGATIVE

## 2022-07-03 LAB — PROTIME-INR
INR: 1.1 (ref 0.8–1.2)
Prothrombin Time: 13.6 seconds (ref 11.4–15.2)

## 2022-07-03 LAB — TROPONIN I (HIGH SENSITIVITY): Troponin I (High Sensitivity): 30 ng/L — ABNORMAL HIGH (ref <18)

## 2022-07-03 LAB — CK: Total CK: 118 U/L (ref 38–234)

## 2022-07-03 MED ORDER — HEPARIN SODIUM (PORCINE) 5000 UNIT/ML IJ SOLN
5000.0000 [IU] | Freq: Three times a day (TID) | INTRAMUSCULAR | Status: DC
  Administered 2022-07-03 – 2022-07-04 (×2): 5000 [IU] via SUBCUTANEOUS
  Filled 2022-07-03 (×2): qty 1

## 2022-07-03 MED ORDER — HYDRALAZINE HCL 20 MG/ML IJ SOLN: 10.0000 mg | INTRAMUSCULAR | Status: DC | PRN

## 2022-07-03 MED ORDER — ATORVASTATIN CALCIUM 10 MG PO TABS
10.0000 mg | ORAL_TABLET | Freq: Every day | ORAL | Status: DC
  Administered 2022-07-03 – 2022-07-07 (×5): 10 mg via ORAL
  Filled 2022-07-03 (×5): qty 1

## 2022-07-03 MED ORDER — ROPIVACAINE HCL 5 MG/ML IJ SOLN: INTRAMUSCULAR | Status: DC | PRN

## 2022-07-03 MED ORDER — ROPIVACAINE HCL 5 MG/ML IJ SOLN
INTRAMUSCULAR | Status: DC | PRN
  Administered 2022-07-03: 30 mL via PERINEURAL

## 2022-07-03 MED ORDER — CARBIDOPA-LEVODOPA 25-100 MG PO TABS
1.0000 | ORAL_TABLET | Freq: Three times a day (TID) | ORAL | Status: DC
  Administered 2022-07-03 – 2022-07-08 (×13): 1 via ORAL
  Filled 2022-07-03 (×13): qty 1

## 2022-07-03 MED ORDER — SODIUM CHLORIDE 0.9 % IV SOLN: INTRAVENOUS | Status: DC

## 2022-07-03 MED ORDER — HYDROCODONE-ACETAMINOPHEN 5-325 MG PO TABS
1.0000 | ORAL_TABLET | Freq: Four times a day (QID) | ORAL | Status: DC | PRN
  Administered 2022-07-03: 2 via ORAL
  Filled 2022-07-03: qty 1
  Filled 2022-07-03: qty 2

## 2022-07-03 MED ORDER — FENTANYL CITRATE (PF) 100 MCG/2ML IJ SOLN
INTRAMUSCULAR | Status: AC
  Administered 2022-07-03: 25 ug
  Filled 2022-07-03: qty 2

## 2022-07-03 MED ORDER — DOCUSATE SODIUM 100 MG PO CAPS
100.0000 mg | ORAL_CAPSULE | Freq: Two times a day (BID) | ORAL | Status: DC
  Administered 2022-07-03: 100 mg via ORAL
  Filled 2022-07-03: qty 1

## 2022-07-03 MED ORDER — MORPHINE SULFATE (PF) 2 MG/ML IV SOLN
2.0000 mg | Freq: Once | INTRAVENOUS | Status: DC
  Filled 2022-07-03: qty 1

## 2022-07-03 MED ORDER — DEXAMETHASONE SODIUM PHOSPHATE 10 MG/ML IJ SOLN
INTRAMUSCULAR | Status: DC | PRN
  Administered 2022-07-03: 5 mg via INTRAVENOUS

## 2022-07-03 MED ORDER — METOPROLOL SUCCINATE ER 50 MG PO TB24
50.0000 mg | ORAL_TABLET | Freq: Every day | ORAL | Status: DC
  Administered 2022-07-05 – 2022-07-08 (×4): 50 mg via ORAL
  Filled 2022-07-03 (×5): qty 1

## 2022-07-03 MED ORDER — SENNOSIDES-DOCUSATE SODIUM 8.6-50 MG PO TABS: 1.0000 | ORAL_TABLET | Freq: Every evening | ORAL | Status: DC | PRN

## 2022-07-03 MED ORDER — PANTOPRAZOLE SODIUM 20 MG PO TBEC
20.0000 mg | DELAYED_RELEASE_TABLET | Freq: Every day | ORAL | Status: DC
Start: 2022-07-03 — End: 2022-07-08
  Administered 2022-07-03 – 2022-07-08 (×6): 20 mg via ORAL
  Filled 2022-07-03 (×7): qty 1

## 2022-07-03 MED ORDER — MORPHINE SULFATE (PF) 2 MG/ML IV SOLN
0.5000 mg | INTRAVENOUS | Status: DC | PRN
  Administered 2022-07-04 – 2022-07-05 (×2): 0.5 mg via INTRAVENOUS
  Filled 2022-07-03 (×2): qty 1

## 2022-07-03 MED ORDER — FENTANYL CITRATE PF 50 MCG/ML IJ SOSY
50.0000 ug | PREFILLED_SYRINGE | INTRAMUSCULAR | Status: DC | PRN
  Administered 2022-07-03: 50 ug via INTRAVENOUS
  Filled 2022-07-03: qty 1

## 2022-07-03 MED ORDER — ONDANSETRON HCL 4 MG/2ML IJ SOLN
4.0000 mg | Freq: Once | INTRAMUSCULAR | Status: AC
  Administered 2022-07-03: 4 mg via INTRAVENOUS
  Filled 2022-07-03: qty 2

## 2022-07-03 NOTE — ED Triage Notes (Signed)
Pt BIB [facility] from independent living after mechanical fall this morning at the dishwasher. Pt A&Ox4, denies pain at this time, VS stable. EMS did pelvic binding, 3in skin tear on right leg.

## 2022-07-03 NOTE — H&P (Addendum)
History and Physical    Patient: Taylor Mendoza [id number]:[id number] DOB: 1930/11/07 DOA: 07/03/2022 DOS: the patient was seen and examined on 07/03/2022 PCP: [name], MD  [facility]  Chief Complaint:  Chief Complaint  Patient presents with   Fall   HPI: Taylor Mendoza is a 88 y.o. female with medical history significant of HTN, CAD, HLD who presents with mechanical fall at dishwasher after losing balance resulting in acute R hip pain. Pt was unable to get up and was on ground for at least 2hrs before being found down by family. No LOC. No chest pain or sob  In ED, pt was found to have acute displaced R femoral neck fracture. Orthopedic Surgery consulted who recommended inpt admission and NPO status. Hospitalist consulted for consideration for admission   Review of Systems: As mentioned in the history of present illness. All other systems reviewed and are negative. Past Medical History:  Diagnosis Date   Coronary artery disease    Methicillin resistant Staphylococcus aureus in conditions classified elsewhere and of unspecified site    MI (myocardial infarction) ([place])    Other and unspecified hyperlipidemia    Unspecified essential hypertension    Past Surgical History:  Procedure Laterality Date   CATARACT EXTRACTION, BILATERAL     CORONARY ANGIOPLASTY WITH STENT PLACEMENT     Social History:  reports that she has never smoked. She has never used smokeless tobacco. She reports that she does not drink alcohol and does not use drugs.  Allergies  Allergen Reactions   Amlodipine Anaphylaxis, Swelling and Other (See Comments)    Edema   Alendronate Other (See Comments)    Dysphagia   Ramipril Cough    Family History  Problem Relation Age of Onset   Osteoporosis Mother    Skin cancer Mother    Stroke Father    Stroke Brother     Prior to Admission medications   Medication Sig Start Date End Date Taking? Authorizing Provider  acetaminophen  (TYLENOL) 500 MG tablet Take 1,000 mg by mouth at bedtime.   Yes [provider]  atorvastatin (LIPITOR) 10 MG tablet Take 10 mg by mouth at bedtime. 04/25/12  Yes [provider]  carbidopa-levodopa (SINEMET IR) 25-100 MG tablet Take 1 tablet by mouth 3 (three) times daily. 02/27/22  Yes Tat, Eustace Quail, DO  furosemide (LASIX) 20 MG tablet Take 20 mg by mouth daily as needed for fluid. 04/26/22  Yes [provider]  metoprolol succinate (TOPROL-XL) 50 MG 24 hr tablet Take 50 mg by mouth daily. 09/12/16  Yes [provider]  nitroGLYCERIN (NITROSTAT) 0.4 MG SL tablet Place 1 tablet (0.4 mg total) under the tongue every 5 (five) minutes as needed for chest pain. 06/12/18 07/03/22 Yes Dorothy Spark, MD  pantoprazole (PROTONIX) 20 MG tablet Take 20 mg by mouth daily. 05/28/22  Yes [provider]    Physical Exam: Vitals:   07/03/22 1529 07/03/22 1531 07/03/22 1600 07/03/22 1730  BP:  (!) 192/67 (!) 160/91 (!) 182/99  Pulse: 63  (!) 51 63  Resp: 15  16 (!) 29  SpO2: 98%  92% 99%   General exam: Awake, laying in bed, in nad Respiratory system: Normal respiratory effort, no wheezing Cardiovascular system: regular rate, s1, s2 Gastrointestinal system: Soft, nondistended, positive BS Central nervous system: CN2-12 grossly intact, strength intact Extremities: Perfused, no clubbing Skin: Normal skin turgor, no notable skin lesions seen Psychiatry: Mood normal // no visual hallucinations  Data Reviewed:  Xray personally reviewed: displaced R femoral neck fracture, CXR clear, EKG reviewed sinus  Assessment and Plan: * Displaced fracture of right femoral neck (HCC)    CAD, NATIVE VESSEL    Essential hypertension    HLD (hyperlipidemia)     Displaced R fem neck fracture -S/p mechanical fall.  -Noted on presenting imaging -EDP discussed with Orhtopedic Surgery who will see in consultation, recs to remain NPO for now -Pre-op CXR and EKG  reviewed. Pt denies sob or chest pains. VSS At this point, benefits to surgery would outweigh perioperative risks  CAD -Stable at this time -EKG sinus, nonischemic. Pt w/o chest pains  HTN -BP suboptimally controlled -Cont with analgesia as needed -Will cont on PRN hydralazine  HLD -Would cont on home meds as tolerated  CKD -Presenting Cr 1.72 -Appears euvolemic on exam -CK unremarkable -Family reports pt has been using lasix on PRN basis for LE swelling -Would cont to hold lasix for now -Cont gentle IVF, repeat bmet in AM   Advance Care Planning:   Code Status: Not on file DNR, confirmed with pt and family. DNR and MOST forms at bedside  Consults: Orthopedic Surgery  Family Communication: Pt in room, family at bedside  Severity of Illness: The appropriate patient status for this patient is INPATIENT. Inpatient status is judged to be reasonable and necessary in order to provide the required intensity of service to ensure the patient's safety. The patient's presenting symptoms, physical exam findings, and initial radiographic and laboratory data in the context of their chronic comorbidities is felt to place them at high risk for further clinical deterioration. Furthermore, it is not anticipated that the patient will be medically stable for discharge from the hospital within 2 midnights of admission.   * I certify that at the point of admission it is my clinical judgment that the patient will require inpatient hospital care spanning beyond 2 midnights from the point of admission due to high intensity of service, high risk for further deterioration and high frequency of surveillance required.*  Author: Marylu Lund, MD 07/03/2022 5:41 PM  For on call review [email or web address].

## 2022-07-03 NOTE — ED Notes (Signed)
Ortho team at bedside

## 2022-07-03 NOTE — ED Notes (Signed)
Patient transporting to PACU

## 2022-07-03 NOTE — Progress Notes (Signed)
Initial EKG noted to be sinus. Repeat EKG noted to have more junctional appearance, narrow complex tachy. Reviewed with Cardiology on call who recommended repeat EKG now. Also recs to check 2d echo. If 2d echo unremarkable, then would clear for surgery

## 2022-07-03 NOTE — ED Provider Notes (Signed)
[facility] EMERGENCY DEPARTMENT Provider Note   CSN: [id number] Arrival date & time: 07/03/22  1521     History  Chief Complaint  Patient presents with   Taylor Mendoza is a 88 y.o. female.  Pt is a 88 yo female with a pmhx significant for CAD, HTN, HLD, and Parkinson's disease.  Pt said she was in the kitchen and lost her balance and fell.  She fell on her right hip.  She has a skin tear to her right lateral calf.  Pt denies any other pain.  She did not hit her head or have a loc.  She is not on blood thinners.       Home Medications Prior to Admission medications   Medication Sig Start Date End Date Taking? Authorizing Provider  acetaminophen (TYLENOL) 500 MG tablet Take 1,000 mg by mouth at bedtime.   Yes [provider]  atorvastatin (LIPITOR) 10 MG tablet Take 10 mg by mouth at bedtime. 04/25/12  Yes [provider]  carbidopa-levodopa (SINEMET IR) 25-100 MG tablet Take 1 tablet by mouth 3 (three) times daily. 02/27/22  Yes Tat, Eustace Quail, DO  furosemide (LASIX) 20 MG tablet Take 20 mg by mouth daily as needed for fluid. 04/26/22  Yes [provider]  metoprolol succinate (TOPROL-XL) 50 MG 24 hr tablet Take 50 mg by mouth daily. 09/12/16  Yes [provider]  nitroGLYCERIN (NITROSTAT) 0.4 MG SL tablet Place 1 tablet (0.4 mg total) under the tongue every 5 (five) minutes as needed for chest pain. 06/12/18 07/03/22 Yes Dorothy Spark, MD  pantoprazole (PROTONIX) 20 MG tablet Take 20 mg by mouth daily. 05/28/22  Yes [provider]      Allergies    Amlodipine, Alendronate, and Ramipril    Review of Systems   Review of Systems  Musculoskeletal:        Right hip pain  Skin:  Positive for wound.  All other systems reviewed and are negative.   Physical Exam Updated Vital Signs BP (!) 182/99   Pulse 63   Resp (!) 29   SpO2 99%  Physical Exam Vitals and nursing note reviewed.  Constitutional:       Appearance: Normal appearance.  HENT:     Head: Normocephalic and atraumatic.     Right Ear: External ear normal.     Left Ear: External ear normal.     Nose: Nose normal.     Mouth/Throat:     Mouth: Mucous membranes are moist.     Pharynx: Oropharynx is clear.  Eyes:     Extraocular Movements: Extraocular movements intact.     Conjunctiva/sclera: Conjunctivae normal.     Pupils: Pupils are equal, round, and reactive to light.  Cardiovascular:     Rate and Rhythm: Normal rate and regular rhythm.     Pulses: Normal pulses.     Heart sounds: Normal heart sounds.  Pulmonary:     Effort: Pulmonary effort is normal.     Breath sounds: Normal breath sounds.  Abdominal:     General: Abdomen is flat. Bowel sounds are normal.     Palpations: Abdomen is soft.  Musculoskeletal:     Cervical back: Normal range of motion and neck supple.     Right hip: Deformity and tenderness present. Decreased range of motion.       Legs:  Skin:    Capillary Refill: Capillary refill takes less than 2 seconds.  Comments: Skin tear right lateral calf  Neurological:     General: No focal deficit present.     Mental Status: She is alert and oriented to person, place, and time.  Psychiatric:        Mood and Affect: Mood normal.        Behavior: Behavior normal.     ED Results / Procedures / Treatments   Labs (all labs ordered are listed, but only abnormal results are displayed) Labs Reviewed  BASIC METABOLIC PANEL - Abnormal; Notable for the following components:      Result Value   BUN 55 (*)    Creatinine, Ser 1.72 (*)    GFR, Estimated 28 (*)    All other components within normal limits  CBC WITH DIFFERENTIAL/PLATELET - Abnormal; Notable for the following components:   WBC 11.2 (*)    Neutro Abs 9.7 (*)    All other components within normal limits  PROTIME-INR  CK  URINALYSIS, ROUTINE W REFLEX MICROSCOPIC  TYPE AND SCREEN    EKG EKG Interpretation  Date/Time:  Wednesday July 03 2022 17:27:29 EDT Ventricular Rate:  62 PR Interval:  218 QRS Duration: 93 QT Interval:  449 QTC Calculation: 456 R Axis:   71 Text Interpretation: Sinus rhythm Borderline prolonged PR interval Left ventricular hypertrophy Anterior Q waves, possibly due to LVH Nonspecific T abnormalities, lateral leads No significant change since last tracing Confirmed by Isla Pence [phone]) on 07/03/2022 6:19:01 PM  Radiology DG Knee Complete 4 Views Right  Result Date: 07/03/2022 CLINICAL DATA:  Right hip fracture EXAM: RIGHT KNEE - COMPLETE 4+ VIEW COMPARISON:  None FINDINGS: Anterior soft tissue swelling. Osteoarthritis most pronounced in the lateral compartment. No evidence of fracture. Question old healed fracture of the proximal fibula. IMPRESSION: No acute fracture. Mild soft tissue swelling. Osteoarthritis most pronounced in the lateral compartment. Question old healed fracture of the proximal fibula. Electronically Signed   By: Nelson Chimes M.D.   On: 07/03/2022 17:16   DG Chest 1 View  Result Date: 07/03/2022 CLINICAL DATA:  Hip fracture EXAM: CHEST  1 VIEW COMPARISON:  12/24/2015 FINDINGS: Chronic cardiomegaly and aortic tortuosity. The lungs are clear. The vascularity is normal. No effusions. No visible rib fracture. IMPRESSION: Cardiomegaly and aortic atherosclerosis.  No active disease. Electronically Signed   By: Nelson Chimes M.D.   On: 07/03/2022 17:14   DG Hip Unilat With Pelvis 2-3 Views Right  Result Date: 07/03/2022 CLINICAL DATA:  Fall with pain EXAM: DG HIP (WITH OR WITHOUT PELVIS) 2-3V RIGHT COMPARISON:  None FINDINGS: Femoral neck fracture with mild displacement. No intertrochanteric component. Right hemipelvis is negative. IMPRESSION: Displaced femoral neck fracture on the right. Electronically Signed   By: Nelson Chimes M.D.   On: 07/03/2022 17:14    Procedures Procedures    Medications Ordered in ED Medications  0.9 %  sodium chloride infusion (has no administration in time  range)  fentaNYL (SUBLIMAZE) injection 50 mcg (50 mcg Intravenous Given 07/03/22 1549)  morphine (PF) 2 MG/ML injection 2 mg (has no administration in time range)  ondansetron (ZOFRAN) injection 4 mg (4 mg Intravenous Given 07/03/22 1548)    ED Course/ Medical Decision Making/ A&P                           Medical Decision Making Amount and/or Complexity of Data Reviewed Labs: ordered. Radiology: ordered.  Risk Prescription drug management. Decision regarding hospitalization.   This patient  presents to the ED for concern of right hip pain, this involves an extensive number of treatment options, and is a complaint that carries with it a high risk of complications and morbidity.  The differential diagnosis includes hip fx, pelvic fx   Co morbidities that complicate the patient evaluation  CAD, HTN, HLD, and Parkinson's disease   Additional history obtained:  Additional history obtained from epic chart review External records from outside source obtained and reviewed including EMS report   Lab Tests:  I Ordered, and personally interpreted labs.  The pertinent results include:  cbc nl, inr 1.1, bmp with bun 55 and cr 1.72 (chronic for pt); ck 118   Imaging Studies ordered:  I ordered imaging studies including hip, knee, chest  I independently visualized and interpreted imaging which showed  CXR: IMPRESSION:  Cardiomegaly and aortic atherosclerosis.  No active disease.   Right Hip: IMPRESSION:  Displaced femoral neck fracture on the right.   Right Knee: IMPRESSION:  No acute fracture. Mild soft tissue swelling. Osteoarthritis most  pronounced in the lateral compartment. Question old healed fracture  of the proximal fibula.   I agree with the radiologist interpretation   Cardiac Monitoring:  The patient was maintained on a cardiac monitor.  I personally viewed and interpreted the cardiac monitored which showed an underlying rhythm of: nsr   Medicines ordered  and prescription drug management:  I ordered medication including fentanyl  for pain  Reevaluation of the patient after these medicines showed that the patient improved I have reviewed the patients home medicines and have made adjustments as needed   Test Considered:  X-rays   Critical Interventions:  Pain control   Consultations Obtained:  I requested consultation with the orthopedics (Dr. Sammuel Hines),  and discussed lab and imaging findings as well as pertinent plan - He recommends medicine admission with NPO after midnight for repair tomorrow.  He requests admission to [facility] because there are no spots available tomorrow for add ons at [facility]. Pt d/w Dr. Wyline Copas (triad) for admission.   Problem List / ED Course:  Right femoral neck fx:   Reevaluation:  After the interventions noted above, I reevaluated the patient and found that they have :improved   Social Determinants of Health:  Lives at [place]:  After consideration of the diagnostic results and the patients response to treatment, I feel that the patent would benefit from admission.          Final Clinical Impression(s) / ED Diagnoses Final diagnoses:  Fall, initial encounter  Closed fracture of neck of right femur, initial encounter [facility])    Rx / [place] Orders ED Discharge Orders     None         Isla Pence, MD 07/03/22 1819

## 2022-07-03 NOTE — Progress Notes (Signed)
Patient ID: Taylor Mendoza, female   DOB: Nov 23, 1930, 88 y.o.   MRN: [id number] I was contacted by one of my partners who is on orthopedic unassigned call today for the [facility] emergency room.  The patient denied being requested to take care of has an acute displaced right hip femoral neck fracture.  I did review the chart and her x-rays.  This injury occurred after a mechanical fall earlier today.  I have been requested from orthopedic standpoint to resume care of the patient due to the need for a right partial versus total hip arthroplasty.  I am operating tomorrow morning at [facility] and have time later in the morning to address the patient's right hip from a surgical standpoint.  It has been requested that the patient be transferred to [facility] in anticipation of surgery later tomorrow morning.  She can eat today from our standpoint and should be n.p.o. after midnight.

## 2022-07-03 NOTE — Significant Event (Signed)
With the patient and family in the emergency room.  I did discuss that given that she does have a displaced femoral neck fracture and her current activity status I do believe she would benefit from hip replacement.  I did describe that given her age and activity level that likely partial hip replacement would be indicated although at this time we will plan for definitive fixation with my partner Dr. Ninfa Linden on the 27th.  Plan to make her n.p.o. at midnight for this.  We will further discuss surgical intervention for her.  All questions were answered.

## 2022-07-03 NOTE — Anesthesia Preprocedure Evaluation (Signed)
Anesthesia Evaluation  Patient identified by MRN, date of birth, ID band Patient awake    Reviewed: Allergy & Precautions, NPO status , Patient's Chart, lab work & pertinent test results  Airway Mallampati: II  TM Distance: >3 FB Neck ROM: Full    Dental no notable dental hx.    Pulmonary neg pulmonary ROS,    Pulmonary exam normal        Cardiovascular hypertension, Pt. on medications and Pt. on home beta blockers + CAD, + Past MI and + Cardiac Stents   Rhythm:Regular Rate:Normal     Neuro/Psych negative neurological ROS  negative psych ROS   GI/Hepatic Neg liver ROS, GERD  Medicated,  Endo/Other  negative endocrine ROS  Renal/GU negative Renal ROS  negative genitourinary   Musculoskeletal negative musculoskeletal ROS (+)   Abdominal Normal abdominal exam  (+)   Peds  Hematology negative hematology ROS (+)   Anesthesia Other Findings   Reproductive/Obstetrics                             Anesthesia Physical Anesthesia Plan  ASA: 3  Anesthesia Plan: Regional   Post-op Pain Management: Regional block*   Induction:   PONV Risk Score and Plan: 2 and Treatment may vary due to age or medical condition  Airway Management Planned: Natural Airway  Additional Equipment: None  Intra-op Plan:   Post-operative Plan:   Informed Consent: I have reviewed the patients History and Physical, chart, labs and discussed the procedure including the risks, benefits and alternatives for the proposed anesthesia with the patient or authorized representative who has indicated his/her understanding and acceptance.     Dental advisory given  Plan Discussed with:   Anesthesia Plan Comments: (Lab Results      Component                Value               Date                      WBC                      11.2 (H)            07/03/2022                HGB                      13.3                 07/03/2022                HCT                      39.7                07/03/2022                MCV                      87.3                07/03/2022                PLT  210                 07/03/2022          )        Anesthesia Quick Evaluation

## 2022-07-03 NOTE — Anesthesia Procedure Notes (Addendum)
  Anesthesia Regional Block: Peng block   Pre-Anesthetic Checklist: , timeout performed,  Correct Patient, Correct Site, Correct Laterality,  Correct Procedure, Correct Position, site marked,  Risks and benefits discussed,  Surgical consent,  Pre-op evaluation,  At surgeon's request and post-op pain management  Laterality: Right  Prep: Dura Prep       Needles:  Injection technique: Single-shot  Needle Type: Echogenic Stimulator Needle     Needle Length: 10cm  Needle Gauge: 20     Additional Needles:   Procedures:,,,, ultrasound used (permanent image in chart),,    Narrative:  Start time: 07/03/2022 7:40 PM End time: 07/03/2022 7:43 PM Injection made incrementally with aspirations every 5 mL.  Performed by: Personally  Anesthesiologist: Darral Dash, DO  Additional Notes: Patient identified. Risks/Benefits/Options discussed with patient including but not limited to bleeding, infection, nerve damage, failed block, incomplete pain control. Patient expressed understanding and wished to proceed. All questions were answered. Sterile technique was used throughout the entire procedure. Please see nursing notes for vital signs. Aspirated in 5cc intervals with injection for negative confirmation. Patient was given instructions on fall risk and not to get out of bed. All questions and concerns addressed with instructions to call with any issues or inadequate analgesia.

## 2022-07-04 ENCOUNTER — Inpatient Hospital Stay (HOSPITAL_COMMUNITY): Payer: PPO

## 2022-07-04 ENCOUNTER — Encounter (HOSPITAL_COMMUNITY)
Admission: EM | Disposition: A | Discharge status: Skilled Nursing Facility | Payer: Self-pay | Source: Skilled Nursing Facility | Attending: Internal Medicine

## 2022-07-04 ENCOUNTER — Encounter (HOSPITAL_COMMUNITY): Payer: Self-pay | Admitting: Internal Medicine

## 2022-07-04 ENCOUNTER — Inpatient Hospital Stay (HOSPITAL_COMMUNITY): Payer: PPO | Admitting: Anesthesiology

## 2022-07-04 ENCOUNTER — Other Ambulatory Visit: Payer: Self-pay

## 2022-07-04 ENCOUNTER — Other Ambulatory Visit (HOSPITAL_COMMUNITY): Payer: PPO

## 2022-07-04 DIAGNOSIS — D72829 Elevated white blood cell count, unspecified: Secondary | ICD-10-CM

## 2022-07-04 DIAGNOSIS — S72001A Fracture of unspecified part of neck of right femur, initial encounter for closed fracture: Secondary | ICD-10-CM | POA: Diagnosis not present

## 2022-07-04 DIAGNOSIS — I2510 Atherosclerotic heart disease of native coronary artery without angina pectoris: Secondary | ICD-10-CM | POA: Diagnosis not present

## 2022-07-04 DIAGNOSIS — I252 Old myocardial infarction: Secondary | ICD-10-CM | POA: Diagnosis not present

## 2022-07-04 DIAGNOSIS — E785 Hyperlipidemia, unspecified: Secondary | ICD-10-CM | POA: Diagnosis not present

## 2022-07-04 DIAGNOSIS — N183 Chronic kidney disease, stage 3 unspecified: Secondary | ICD-10-CM

## 2022-07-04 DIAGNOSIS — I10 Essential (primary) hypertension: Secondary | ICD-10-CM | POA: Diagnosis not present

## 2022-07-04 DIAGNOSIS — R9431 Abnormal electrocardiogram [ECG] [EKG]: Secondary | ICD-10-CM | POA: Diagnosis not present

## 2022-07-04 HISTORY — PX: HIP ARTHROPLASTY: SHX981

## 2022-07-04 LAB — CBC
HCT: 35.5 % — ABNORMAL LOW (ref 36.0–46.0)
Hemoglobin: 11.5 g/dL — ABNORMAL LOW (ref 12.0–15.0)
MCH: 29.3 pg (ref 26.0–34.0)
MCHC: 32.4 g/dL (ref 30.0–36.0)
MCV: 90.6 fL (ref 80.0–100.0)
Platelets: 178 10*3/uL (ref 150–400)
RBC: 3.92 MIL/uL (ref 3.87–5.11)
RDW: 13.5 % (ref 11.5–15.5)
WBC: 5.9 10*3/uL (ref 4.0–10.5)
nRBC: 0 % (ref 0.0–0.2)

## 2022-07-04 LAB — COMPREHENSIVE METABOLIC PANEL WITH GFR
ALT: 6 U/L (ref 0–44)
AST: 18 U/L (ref 15–41)
Albumin: 3.3 g/dL — ABNORMAL LOW (ref 3.5–5.0)
Alkaline Phosphatase: 74 U/L (ref 38–126)
Anion gap: 9 (ref 5–15)
BUN: 53 mg/dL — ABNORMAL HIGH (ref 8–23)
CO2: 23 mmol/L (ref 22–32)
Calcium: 8.8 mg/dL — ABNORMAL LOW (ref 8.9–10.3)
Chloride: 111 mmol/L (ref 98–111)
Creatinine, Ser: 1.58 mg/dL — ABNORMAL HIGH (ref 0.44–1.00)
GFR, Estimated: 31 mL/min — ABNORMAL LOW
Glucose, Bld: 153 mg/dL — ABNORMAL HIGH (ref 70–99)
Potassium: 4.1 mmol/L (ref 3.5–5.1)
Sodium: 143 mmol/L (ref 135–145)
Total Bilirubin: 0.7 mg/dL (ref 0.3–1.2)
Total Protein: 6.1 g/dL — ABNORMAL LOW (ref 6.5–8.1)

## 2022-07-04 LAB — URINALYSIS, ROUTINE W REFLEX MICROSCOPIC
Bilirubin Urine: NEGATIVE
Glucose, UA: NEGATIVE mg/dL
Hgb urine dipstick: NEGATIVE
Ketones, ur: NEGATIVE mg/dL
Leukocytes,Ua: NEGATIVE
Nitrite: NEGATIVE
Protein, ur: NEGATIVE mg/dL
Specific Gravity, Urine: 1.019 (ref 1.005–1.030)
pH: 5 (ref 5.0–8.0)

## 2022-07-04 LAB — SURGICAL PCR SCREEN
MRSA, PCR: NEGATIVE
Staphylococcus aureus: NEGATIVE

## 2022-07-04 LAB — ECHOCARDIOGRAM COMPLETE
Area-P 1/2: 1.94 cm2
Calc EF: 45.5 %
Height: 65.984 in
P 1/2 time: 842 msec
S' Lateral: 3.2 cm
Single Plane A2C EF: 43.5 %
Single Plane A4C EF: 47.3 %
Weight: 1915.36 oz

## 2022-07-04 SURGERY — HEMIARTHROPLASTY, HIP, DIRECT ANTERIOR APPROACH, FOR FRACTURE
Anesthesia: General | Site: Hip | Laterality: Right

## 2022-07-04 MED ORDER — METOCLOPRAMIDE HCL 5 MG PO TABS: 5.0000 mg | ORAL_TABLET | Freq: Three times a day (TID) | ORAL | Status: DC | PRN

## 2022-07-04 MED ORDER — OXYCODONE HCL 5 MG PO TABS: 5.0000 mg | ORAL_TABLET | Freq: Once | ORAL | Status: DC | PRN

## 2022-07-04 MED ORDER — PERFLUTREN LIPID MICROSPHERE
1.0000 mL | INTRAVENOUS | Status: AC | PRN
  Administered 2022-07-04: 3 mL via INTRAVENOUS

## 2022-07-04 MED ORDER — CEFAZOLIN SODIUM-DEXTROSE 2-4 GM/100ML-% IV SOLN
2.0000 g | Freq: Once | INTRAVENOUS | Status: AC
  Administered 2022-07-04: 2 g via INTRAVENOUS

## 2022-07-04 MED ORDER — MEPERIDINE HCL 50 MG/ML IJ SOLN: 6.2500 mg | INTRAMUSCULAR | Status: DC | PRN

## 2022-07-04 MED ORDER — TRANEXAMIC ACID-NACL 1000-0.7 MG/100ML-% IV SOLN
1000.0000 mg | Freq: Once | INTRAVENOUS | Status: AC
  Administered 2022-07-04: 1000 mg via INTRAVENOUS
  Filled 2022-07-04: qty 100

## 2022-07-04 MED ORDER — FENTANYL CITRATE PF 50 MCG/ML IJ SOSY
PREFILLED_SYRINGE | INTRAMUSCULAR | Status: AC
  Filled 2022-07-04: qty 1

## 2022-07-04 MED ORDER — EPHEDRINE SULFATE-NACL 50-0.9 MG/10ML-% IV SOSY
PREFILLED_SYRINGE | INTRAVENOUS | Status: DC | PRN
  Administered 2022-07-04: 7.5 mg via INTRAVENOUS
  Administered 2022-07-04: 5 mg via INTRAVENOUS

## 2022-07-04 MED ORDER — TRANEXAMIC ACID-NACL 1000-0.7 MG/100ML-% IV SOLN
INTRAVENOUS | Status: AC
  Filled 2022-07-04: qty 100

## 2022-07-04 MED ORDER — MORPHINE SULFATE (PF) 2 MG/ML IV SOLN
0.5000 mg | INTRAVENOUS | Status: DC | PRN
  Administered 2022-07-04: 1 mg via INTRAVENOUS
  Filled 2022-07-04: qty 1

## 2022-07-04 MED ORDER — FENTANYL CITRATE (PF) 100 MCG/2ML IJ SOLN
INTRAMUSCULAR | Status: AC
  Filled 2022-07-04: qty 2

## 2022-07-04 MED ORDER — SUGAMMADEX SODIUM 200 MG/2ML IV SOLN
INTRAVENOUS | Status: DC | PRN
  Administered 2022-07-04: 200 mg via INTRAVENOUS

## 2022-07-04 MED ORDER — EPHEDRINE 5 MG/ML INJ
INTRAVENOUS | Status: AC
  Filled 2022-07-04: qty 5

## 2022-07-04 MED ORDER — ONDANSETRON HCL 4 MG/2ML IJ SOLN
4.0000 mg | Freq: Four times a day (QID) | INTRAMUSCULAR | Status: DC | PRN
  Administered 2022-07-04 – 2022-07-05 (×3): 4 mg via INTRAVENOUS
  Filled 2022-07-04 (×3): qty 2

## 2022-07-04 MED ORDER — TRANEXAMIC ACID-NACL 1000-0.7 MG/100ML-% IV SOLN
1000.0000 mg | INTRAVENOUS | Status: AC
Start: 2022-07-04 — End: 2022-07-04
  Administered 2022-07-04: 1000 mg via INTRAVENOUS

## 2022-07-04 MED ORDER — ONDANSETRON HCL 4 MG PO TABS: 4.0000 mg | ORAL_TABLET | Freq: Four times a day (QID) | ORAL | Status: DC | PRN

## 2022-07-04 MED ORDER — FENTANYL CITRATE (PF) 100 MCG/2ML IJ SOLN
INTRAMUSCULAR | Status: DC | PRN
  Administered 2022-07-04 (×4): 50 ug via INTRAVENOUS

## 2022-07-04 MED ORDER — OXYCODONE HCL 5 MG/5ML PO SOLN: 5.0000 mg | Freq: Once | ORAL | Status: DC | PRN

## 2022-07-04 MED ORDER — CEFAZOLIN SODIUM-DEXTROSE 2-4 GM/100ML-% IV SOLN
2.0000 g | Freq: Four times a day (QID) | INTRAVENOUS | Status: AC
  Administered 2022-07-04 (×2): 2 g via INTRAVENOUS
  Filled 2022-07-04 (×2): qty 100

## 2022-07-04 MED ORDER — PROPOFOL 10 MG/ML IV BOLUS
INTRAVENOUS | Status: AC
  Filled 2022-07-04: qty 20

## 2022-07-04 MED ORDER — ACETAMINOPHEN 325 MG PO TABS: 325.0000 mg | ORAL_TABLET | ORAL | Status: DC | PRN

## 2022-07-04 MED ORDER — ASPIRIN 325 MG PO TBEC
325.0000 mg | DELAYED_RELEASE_TABLET | Freq: Every day | ORAL | Status: DC
  Administered 2022-07-05 – 2022-07-08 (×4): 325 mg via ORAL
  Filled 2022-07-04 (×4): qty 1

## 2022-07-04 MED ORDER — ONDANSETRON HCL 4 MG/2ML IJ SOLN
INTRAMUSCULAR | Status: DC | PRN
  Administered 2022-07-04: 4 mg via INTRAVENOUS

## 2022-07-04 MED ORDER — DEXAMETHASONE SODIUM PHOSPHATE 10 MG/ML IJ SOLN
INTRAMUSCULAR | Status: AC
  Filled 2022-07-04: qty 1

## 2022-07-04 MED ORDER — ROCURONIUM BROMIDE 10 MG/ML (PF) SYRINGE
PREFILLED_SYRINGE | INTRAVENOUS | Status: DC | PRN
  Administered 2022-07-04: 70 mg via INTRAVENOUS

## 2022-07-04 MED ORDER — PHENOL 1.4 % MT LIQD: 1.0000 | OROMUCOSAL | Status: DC | PRN

## 2022-07-04 MED ORDER — CEFAZOLIN SODIUM-DEXTROSE 2-4 GM/100ML-% IV SOLN
INTRAVENOUS | Status: AC
  Filled 2022-07-04: qty 100

## 2022-07-04 MED ORDER — ROCURONIUM BROMIDE 10 MG/ML (PF) SYRINGE
PREFILLED_SYRINGE | INTRAVENOUS | Status: AC
  Filled 2022-07-04: qty 10

## 2022-07-04 MED ORDER — PHENYLEPHRINE 80 MCG/ML (10ML) SYRINGE FOR IV PUSH (FOR BLOOD PRESSURE SUPPORT)
PREFILLED_SYRINGE | INTRAVENOUS | Status: AC
  Filled 2022-07-04: qty 10

## 2022-07-04 MED ORDER — LIDOCAINE 2% (20 MG/ML) 5 ML SYRINGE
INTRAMUSCULAR | Status: DC | PRN
  Administered 2022-07-04: 60 mg via INTRAVENOUS

## 2022-07-04 MED ORDER — LACTATED RINGERS IV SOLN: INTRAVENOUS | Status: DC | PRN

## 2022-07-04 MED ORDER — PROPOFOL 10 MG/ML IV BOLUS
INTRAVENOUS | Status: DC | PRN
  Administered 2022-07-04: 100 mg via INTRAVENOUS
  Administered 2022-07-04: 50 mg via INTRAVENOUS

## 2022-07-04 MED ORDER — PHENYLEPHRINE 80 MCG/ML (10ML) SYRINGE FOR IV PUSH (FOR BLOOD PRESSURE SUPPORT)
PREFILLED_SYRINGE | INTRAVENOUS | Status: DC | PRN
  Administered 2022-07-04: 80 ug via INTRAVENOUS
  Administered 2022-07-04: 120 ug via INTRAVENOUS

## 2022-07-04 MED ORDER — LACTATED RINGERS IV SOLN: INTRAVENOUS | Status: DC

## 2022-07-04 MED ORDER — HYDROCODONE-ACETAMINOPHEN 7.5-325 MG PO TABS
1.0000 | ORAL_TABLET | ORAL | Status: DC | PRN
  Administered 2022-07-04: 2 via ORAL
  Administered 2022-07-04 – 2022-07-08 (×6): 1 via ORAL
  Filled 2022-07-04 (×5): qty 1
  Filled 2022-07-04: qty 2
  Filled 2022-07-04 (×2): qty 1

## 2022-07-04 MED ORDER — HYDROCODONE-ACETAMINOPHEN 5-325 MG PO TABS
1.0000 | ORAL_TABLET | ORAL | Status: DC | PRN
  Administered 2022-07-06: 1 via ORAL
  Filled 2022-07-04 (×2): qty 1

## 2022-07-04 MED ORDER — ACETAMINOPHEN 325 MG PO TABS: 325.0000 mg | ORAL_TABLET | Freq: Four times a day (QID) | ORAL | Status: DC | PRN

## 2022-07-04 MED ORDER — MUPIROCIN 2 % EX OINT: 1.0000 | TOPICAL_OINTMENT | Freq: Two times a day (BID) | CUTANEOUS | Status: DC

## 2022-07-04 MED ORDER — CHLORHEXIDINE GLUCONATE 0.12 % MT SOLN
15.0000 mL | Freq: Once | OROMUCOSAL | Status: AC
  Administered 2022-07-04: 15 mL via OROMUCOSAL

## 2022-07-04 MED ORDER — FENTANYL CITRATE PF 50 MCG/ML IJ SOSY: 25.0000 ug | PREFILLED_SYRINGE | INTRAMUSCULAR | Status: DC | PRN

## 2022-07-04 MED ORDER — GLYCOPYRROLATE 0.2 MG/ML IJ SOLN
INTRAMUSCULAR | Status: AC
  Filled 2022-07-04: qty 1

## 2022-07-04 MED ORDER — ONDANSETRON HCL 4 MG/2ML IJ SOLN: 4.0000 mg | Freq: Once | INTRAMUSCULAR | Status: DC | PRN

## 2022-07-04 MED ORDER — ACETAMINOPHEN 160 MG/5ML PO SOLN: 325.0000 mg | ORAL | Status: DC | PRN

## 2022-07-04 MED ORDER — LIDOCAINE HCL (PF) 2 % IJ SOLN
INTRAMUSCULAR | Status: AC
  Filled 2022-07-04: qty 5

## 2022-07-04 MED ORDER — DEXAMETHASONE SODIUM PHOSPHATE 4 MG/ML IJ SOLN
INTRAMUSCULAR | Status: DC | PRN
  Administered 2022-07-04: 5 mg via INTRAVENOUS

## 2022-07-04 MED ORDER — ONDANSETRON HCL 4 MG/2ML IJ SOLN
INTRAMUSCULAR | Status: AC
  Filled 2022-07-04: qty 2

## 2022-07-04 MED ORDER — DOCUSATE SODIUM 100 MG PO CAPS
100.0000 mg | ORAL_CAPSULE | Freq: Two times a day (BID) | ORAL | Status: DC
  Administered 2022-07-04 – 2022-07-08 (×8): 100 mg via ORAL
  Filled 2022-07-04 (×8): qty 1

## 2022-07-04 MED ORDER — ORAL CARE MOUTH RINSE: 15.0000 mL | OROMUCOSAL | Status: DC | PRN

## 2022-07-04 MED ORDER — GLYCOPYRROLATE 0.2 MG/ML IJ SOLN
INTRAMUSCULAR | Status: DC | PRN
  Administered 2022-07-04: .2 mg via INTRAVENOUS

## 2022-07-04 MED ORDER — MENTHOL 3 MG MT LOZG: 1.0000 | LOZENGE | OROMUCOSAL | Status: DC | PRN

## 2022-07-04 MED ORDER — METOCLOPRAMIDE HCL 5 MG/ML IJ SOLN
5.0000 mg | Freq: Three times a day (TID) | INTRAMUSCULAR | Status: DC | PRN
  Administered 2022-07-04 – 2022-07-05 (×2): 10 mg via INTRAVENOUS
  Filled 2022-07-04 (×2): qty 2

## 2022-07-04 MED ORDER — FENTANYL CITRATE PF 50 MCG/ML IJ SOSY
25.0000 ug | PREFILLED_SYRINGE | Freq: Once | INTRAMUSCULAR | Status: AC
  Administered 2022-07-04: 25 ug via INTRAVENOUS

## 2022-07-04 SURGICAL SUPPLY — 55 items
BAG COUNTER SPONGE SURGICOUNT (BAG) IMPLANT
BAG SPEC THK2 15X12 ZIP CLS (MISCELLANEOUS) ×1
BAG SPNG CNTER NS LX DISP (BAG)
BAG ZIPLOCK 12X15 (MISCELLANEOUS) ×2 IMPLANT
BALL HIP ARTICU 28 +5 (Hips) IMPLANT
BIPOLAR PROS AML 45 (Hips) ×2 IMPLANT
BLADE EXTENDED COATED 6.5IN (ELECTRODE) ×2 IMPLANT
BLADE HEX COATED 2.75 (ELECTRODE) ×2 IMPLANT
BLADE SAG 18X100X1.27 (BLADE) ×2 IMPLANT
BLADE SAW SGTL 73X25 THK (BLADE) ×2 IMPLANT
BRUSH FEMORAL CANAL (MISCELLANEOUS) IMPLANT
COVER SURGICAL LIGHT HANDLE (MISCELLANEOUS) ×2 IMPLANT
DRAPE HIP W/POCKET STRL (MISCELLANEOUS) ×2 IMPLANT
DRAPE INCISE IOBAN 66X45 STRL (DRAPES) ×2 IMPLANT
DRAPE POUCH INSTRU U-SHP 10X18 (DRAPES) ×2 IMPLANT
DRAPE U-SHAPE 47X51 STRL (DRAPES) ×2 IMPLANT
DRSG AQUACEL AG ADV 3.5X10 (GAUZE/BANDAGES/DRESSINGS) ×1 IMPLANT
DRSG MEPILEX BORDER 4X12 (GAUZE/BANDAGES/DRESSINGS) ×2 IMPLANT
DRSG MEPILEX BORDER 4X8 (GAUZE/BANDAGES/DRESSINGS) ×2 IMPLANT
DRSG PAD ABDOMINAL 8X10 ST (GAUZE/BANDAGES/DRESSINGS) ×2 IMPLANT
ELECT REM PT RETURN 15FT ADLT (MISCELLANEOUS) ×2 IMPLANT
EVACUATOR 1/8 PVC DRAIN (DRAIN) IMPLANT
FACESHIELD WRAPAROUND (MASK) ×6 IMPLANT
FACESHIELD WRAPAROUND OR TEAM (MASK) ×3 IMPLANT
GAUZE SPONGE 4X4 12PLY STRL (GAUZE/BANDAGES/DRESSINGS) IMPLANT
GLOVE BIO SURGEON STRL SZ7.5 (GLOVE) ×2 IMPLANT
GLOVE BIOGEL PI IND STRL 8 (GLOVE) ×1 IMPLANT
GLOVE BIOGEL PI INDICATOR 8 (GLOVE) ×1
GLOVE ECLIPSE 8.0 STRL XLNG CF (GLOVE) ×2 IMPLANT
GOWN STRL REUS W/ TWL XL LVL3 (GOWN DISPOSABLE) ×1 IMPLANT
GOWN STRL REUS W/TWL XL LVL3 (GOWN DISPOSABLE) ×2
HANDPIECE INTERPULSE COAX TIP (DISPOSABLE) ×2
HEAD BIPOLAR PROS AML 45 (Hips) IMPLANT
HIP BALL ARTICU 28 +5 (Hips) ×2 IMPLANT
IMMOBILIZER KNEE 20 (SOFTGOODS) IMPLANT
IMMOBILIZER KNEE 20 THIGH 36 (SOFTGOODS) IMPLANT
KIT TURNOVER KIT A (KITS) IMPLANT
NDL MA TROC 1/2 (NEEDLE) IMPLANT
NEEDLE MA TROC 1/2 (NEEDLE) IMPLANT
PACK TOTAL JOINT (CUSTOM PROCEDURE TRAY) ×2 IMPLANT
PASSER SUT SWANSON 36MM LOOP (INSTRUMENTS) IMPLANT
PROTECTOR NERVE ULNAR (MISCELLANEOUS) ×2 IMPLANT
SET HNDPC FAN SPRY TIP SCT (DISPOSABLE) ×1 IMPLANT
SPONGE T-LAP 4X18 ~~LOC~~+RFID (SPONGE) IMPLANT
STAPLER VISISTAT 35W (STAPLE) ×1 IMPLANT
STEM FEM ACTIS STD SZ7 (Nail) ×1 IMPLANT
SUT ETHIBOND NAB CT1 #1 30IN (SUTURE) ×4 IMPLANT
SUT FIBERWIRE #2 38 T-5 BLUE (SUTURE) ×4
SUT VIC AB 1 CT1 36 (SUTURE) ×4 IMPLANT
SUT VIC AB 2-0 CT1 27 (SUTURE) ×4
SUT VIC AB 2-0 CT1 TAPERPNT 27 (SUTURE) ×2 IMPLANT
SUTURE FIBERWR #2 38 T-5 BLUE (SUTURE) ×2 IMPLANT
TOWEL OR 17X26 10 PK STRL BLUE (TOWEL DISPOSABLE) ×6 IMPLANT
TOWER CARTRIDGE SMART MIX (DISPOSABLE) IMPLANT
TRAY FOLEY MTR SLVR 16FR STAT (SET/KITS/TRAYS/PACK) ×2 IMPLANT

## 2022-07-04 NOTE — Progress Notes (Signed)
Patient ID: Taylor Mendoza, female   DOB: 1930-01-07, 88 y.o.   MRN: [id number] I have seen and examined the patient at her bedside.  She does have a right hip femoral neck fracture that is displaced.  Her right leg is shortened and externally rotated.  I was able to review her x-rays with her daughter who is at the bedside as well.  She is also currently getting a cardiac echo.  The technologist performing the echo is going to contact the cardiologist as soon as this is completed.  Our plan is to proceed with surgery this morning for a right partial versus total hip arthroplasty to treat this injury.  I described in detail what the surgery involves as well as had a thorough discussion about the risk and benefits of surgery.  Informed consent has been obtained.  Her vital signs were thus far stable.

## 2022-07-04 NOTE — Progress Notes (Signed)
The patient refused dangling tonight at 21:07 and 22:25.

## 2022-07-04 NOTE — Consult Note (Addendum)
[place] Nurse Consult Note: Reason for Consult: Consult requested for right leg.  Pt had a fall prior to admission. Assessed wound with family member at the bedside. Wound type: Full thickness abrasion in a "C" shape to right outer calf; 4X.2X.2cm, skin approximated over 90% of the wound, 10% red and moist.  Surrounding skin with generalized edema and dark purple bruising. Dressing procedure/placement/frequency: Topical treatment orders provided for bedside nurses to perform as follows to promote moist healing: Apply double-folded xeroform gauze to right leg wound Q day, then cover with foam dressing.  (Change foam dressing Q 3 days or PRN soiling.) Please re-consult if further assistance is needed.  Thank-you,  Julien Girt MSN, [place], [place], [place], [place]

## 2022-07-04 NOTE — Transfer of Care (Signed)
Immediate Anesthesia Transfer of Care Note  Patient: Taylor Mendoza  Procedure(s) Performed: RIGHT ANTERIOR HIP REPLACEMENT  (Right: Hip)  Patient Location: PACU  Anesthesia Type:General  Level of Consciousness: awake, alert  and oriented  Airway & Oxygen Therapy: Patient Spontanous Breathing and Patient connected to face mask oxygen  Post-op Assessment: Report given to RN  Post vital signs: Reviewed and stable  Last Vitals:  Vitals Value Taken Time  BP    Temp    Pulse 91 07/04/22 1217  Resp 16 07/04/22 1217  SpO2 100 % 07/04/22 1217  Vitals shown include unvalidated device data.  Last Pain:  Vitals:   07/04/22 1001  TempSrc:   PainSc: 5          Complications: No notable events documented.

## 2022-07-04 NOTE — Anesthesia Procedure Notes (Signed)
Procedure Name: Intubation Date/Time: 07/04/2022 10:26 AM  Performed by: Claudia Desanctis, CRNAPre-anesthesia Checklist: Patient identified, Emergency Drugs available, Suction available and Patient being monitored Patient Re-evaluated:Patient Re-evaluated prior to induction Oxygen Delivery Method: Circle system utilized Preoxygenation: Pre-oxygenation with 100% oxygen Induction Type: IV induction Ventilation: Mask ventilation without difficulty Laryngoscope Size: Miller and 3 Grade View: Grade I Tube type: Oral Tube size: 7.0 mm Number of attempts: 1 Airway Equipment and Method: Stylet Placement Confirmation: ETT inserted through vocal cords under direct vision, positive ETCO2 and breath sounds checked- equal and bilateral Secured at: 22 cm Tube secured with: Tape Dental Injury: Teeth and Oropharynx as per pre-operative assessment

## 2022-07-04 NOTE — Anesthesia Postprocedure Evaluation (Signed)
Anesthesia Post Note  Patient: Taylor Mendoza  Procedure(s) Performed: RIGHT ANTERIOR HIP REPLACEMENT  (Right: Hip)     Patient location during evaluation: PACU Anesthesia Type: General Level of consciousness: awake and alert Pain management: pain level controlled Vital Signs Assessment: post-procedure vital signs reviewed and stable Respiratory status: spontaneous breathing, nonlabored ventilation, respiratory function stable and patient connected to nasal cannula oxygen Cardiovascular status: blood pressure returned to baseline and stable Postop Assessment: no apparent nausea or vomiting Anesthetic complications: no   No notable events documented.  Last Vitals:  Vitals:   07/04/22 1449 07/04/22 1520  BP:  (!) 132/49  Pulse: (!) 58   Resp:  16  Temp:    SpO2:      Last Pain:  Vitals:   07/04/22 1528  TempSrc:   PainSc: 2                  Nica Friske

## 2022-07-04 NOTE — Progress Notes (Signed)
PROGRESS NOTE    Taylor Mendoza  KNL:[id number] DOB: [birth year] DOA: 07/03/2022 PCP: [name], MD   Brief Narrative:  88 y.o. female with medical history significant of HTN, CAD, HLD presented with a mechanical fall and was found acute displaced right femoral neck fracture.  Orthopedics was consulted.  Assessment & Plan:   Acute displaced right femoral neck fracture after a mechanical fall -Orthopedics following and planning for surgical intervention today. -Continue NPO.  Continue pain management.  For prophylaxis.  ?  Junctional tachycardia -EKG yesterday evening was suggestive of?  Junctional tachycardia.  Prior hospitalist discussed with cardiology on-call who recommended 2D echo.  Follow 2D echo. -Currently heart rate controlled.  Daughter wants to proceed with surgery despite the echo results.  This was communicated with orthopedics team.  CAD Hyperlipidemia Hypertension -No chest pain.  Stable.  Continue statin and metoprolol  Chronic disease stage IIIb -Creatinine stable.  Monitor intermittently  Leukocytosis -Reactive.  Resolved   DVT prophylaxis: Heparin subcutaneous Code Status: DNR Family Communication: Daughter at bedside Disposition Plan: Status is: Inpatient Remains inpatient appropriate because: Of severity of illness    Consultants: Orthopedics Procedures: None  Antimicrobials: None   Subjective: Patient seen and examined at bedside.  Daughter present at bedside.  Complains of intermittent right hip pain.  No fever, vomiting, chest pain reported.  Objective: Vitals:   07/03/22 2126 07/04/22 0207 07/04/22 0541 07/04/22 0930  BP: (!) 164/74 (!) 136/58 126/77 (!) 158/44  Pulse: (!) 48 (!) 52 (!) 50 (!) 46  Resp: '17 18 17 15  '$ Temp: 98.5 F (36.9 C) 98.5 F (36.9 C) (!) 97.5 F (36.4 C) 97.6 F (36.4 C)  TempSrc: Oral Oral Oral Oral  SpO2: 97% 94% 97% 97%  Weight:  54.3 kg    Height:  5' 5.98" (1.676 m)      Intake/Output Summary  (Last 24 hours) at 07/04/2022 0932 Last data filed at 07/04/2022 0754 Gross per 24 hour  Intake 804.18 ml  Output --  Net 804.18 ml   Filed Weights   07/04/22 0207  Weight: 54.3 kg    Examination:  General exam: Appears calm and comfortable.  Currently on room air.  Elderly female lying in bed. Respiratory system: Bilateral decreased breath sounds at bases with some scattered crackles Cardiovascular system: S1 & S2 heard, intermittently bradycardic gastrointestinal system: Abdomen is nondistended, soft and nontender. Normal bowel sounds heard. Extremities: No cyanosis, clubbing, edema  Central nervous system: Alert and oriented.  Slow to respond.  Poor historian.  No focal neurological deficits. Moving extremities Skin: No rashes, lesions or ulcers Psychiatry: Affect is mostly flat.  No signs of agitation.  Data Reviewed: I have personally reviewed following labs and imaging studies  CBC: Recent Labs  Lab 07/03/22 1552 07/03/22 2123 07/04/22 0340  WBC 11.2* 9.3 5.9  NEUTROABS 9.7*  --   --   HGB 13.3 12.4 11.5*  HCT 39.7 37.4 35.5*  MCV 87.3 89.0 90.6  PLT 210 197 790   Basic Metabolic Panel: Recent Labs  Lab 07/03/22 1552 07/03/22 2123 07/04/22 0340  NA 141  --  143  K 3.6  --  4.1  CL 107  --  111  CO2 24  --  23  GLUCOSE 98  --  153*  BUN 55*  --  53*  CREATININE 1.72* 1.46* 1.58*  CALCIUM 9.2  --  8.8*   GFR: Estimated Creatinine Clearance: 20.7 mL/min (A) (by C-G formula based on SCr of 1.58  mg/dL (H)). Liver Function Tests: Recent Labs  Lab 07/04/22 0340  AST 18  ALT 6  ALKPHOS 74  BILITOT 0.7  PROT 6.1*  ALBUMIN 3.3*   No results for input(s): "LIPASE", "AMYLASE" in the last 168 hours. No results for input(s): "AMMONIA" in the last 168 hours. Coagulation Profile: Recent Labs  Lab 07/03/22 1552  INR 1.1   Cardiac Enzymes: Recent Labs  Lab 07/03/22 1552  CKTOTAL 118   BNP (last 3 results) No results for input(s): "PROBNP" in the last  8760 hours. HbA1C: No results for input(s): "HGBA1C" in the last 72 hours. CBG: No results for input(s): "GLUCAP" in the last 168 hours. Lipid Profile: No results for input(s): "CHOL", "HDL", "LDLCALC", "TRIG", "CHOLHDL", "LDLDIRECT" in the last 72 hours. Thyroid Function Tests: No results for input(s): "TSH", "T4TOTAL", "FREET4", "T3FREE", "THYROIDAB" in the last 72 hours. Anemia Panel: No results for input(s): "VITAMINB12", "FOLATE", "FERRITIN", "TIBC", "IRON", "RETICCTPCT" in the last 72 hours. Sepsis Labs: No results for input(s): "PROCALCITON", "LATICACIDVEN" in the last 168 hours.  Recent Results (from the past 240 hour(s))  Surgical PCR screen     Status: None   Collection Time: 07/04/22  1:03 AM   Specimen: Nasal Mucosa; Nasal Swab  Result Value Ref Range Status   MRSA, PCR NEGATIVE NEGATIVE Final   Staphylococcus aureus NEGATIVE NEGATIVE Final    Comment: (NOTE) The Xpert SA Assay (FDA approved for NASAL specimens in patients 32 years of age and older), is one component of a comprehensive surveillance program. It is not intended to diagnose infection nor to guide or monitor treatment. Performed at [facility], [place] [address] [place], [place] [address]          Radiology Studies: DG Knee Complete 4 Views Right  Result Date: 07/03/2022 CLINICAL DATA:  Right hip fracture EXAM: RIGHT KNEE - COMPLETE 4+ VIEW COMPARISON:  None FINDINGS: Anterior soft tissue swelling. Osteoarthritis most pronounced in the lateral compartment. No evidence of fracture. Question old healed fracture of the proximal fibula. IMPRESSION: No acute fracture. Mild soft tissue swelling. Osteoarthritis most pronounced in the lateral compartment. Question old healed fracture of the proximal fibula. Electronically Signed   By: Nelson Chimes M.D.   On: 07/03/2022 17:16   DG Chest 1 View  Result Date: 07/03/2022 CLINICAL DATA:  Hip fracture EXAM: CHEST  1 VIEW COMPARISON:  12/24/2015  FINDINGS: Chronic cardiomegaly and aortic tortuosity. The lungs are clear. The vascularity is normal. No effusions. No visible rib fracture. IMPRESSION: Cardiomegaly and aortic atherosclerosis.  No active disease. Electronically Signed   By: Nelson Chimes M.D.   On: 07/03/2022 17:14   DG Hip Unilat With Pelvis 2-3 Views Right  Result Date: 07/03/2022 CLINICAL DATA:  Fall with pain EXAM: DG HIP (WITH OR WITHOUT PELVIS) 2-3V RIGHT COMPARISON:  None FINDINGS: Femoral neck fracture with mild displacement. No intertrochanteric component. Right hemipelvis is negative. IMPRESSION: Displaced femoral neck fracture on the right. Electronically Signed   By: Nelson Chimes M.D.   On: 07/03/2022 17:14        Scheduled Meds:  [MAR Hold] atorvastatin  10 mg Oral QHS   [MAR Hold] carbidopa-levodopa  1 tablet Oral TID   [MAR Hold] docusate sodium  100 mg Oral BID   [MAR Hold] heparin  5,000 Units Subcutaneous Q8H   [MAR Hold] metoprolol succinate  50 mg Oral Daily   [MAR Hold]  morphine injection  2 mg Intravenous Once   [MAR Hold] pantoprazole  20  mg Oral Daily   Continuous Infusions:  sodium chloride     sodium chloride 75 mL/hr at 07/04/22 0754          Aline August, MD [facility] 07/04/2022, 9:32 AM

## 2022-07-04 NOTE — Anesthesia Preprocedure Evaluation (Signed)
Anesthesia Evaluation  Patient identified by MRN, date of birth, ID band Patient awake    Reviewed: Allergy & Precautions, NPO status , Patient's Chart, lab work & pertinent test results  Airway Mallampati: II  TM Distance: >3 FB Neck ROM: Full    Dental no notable dental hx.    Pulmonary neg pulmonary ROS,    Pulmonary exam normal        Cardiovascular hypertension, Pt. on medications and Pt. on home beta blockers + CAD, + Past MI and + Cardiac Stents   Rhythm:Regular Rate:Normal     Neuro/Psych negative neurological ROS  negative psych ROS   GI/Hepatic Neg liver ROS, GERD  Medicated,  Endo/Other  negative endocrine ROS  Renal/GU negative Renal ROS  negative genitourinary   Musculoskeletal negative musculoskeletal ROS (+)   Abdominal Normal abdominal exam  (+)   Peds  Hematology negative hematology ROS (+)   Anesthesia Other Findings Coronary artery disease  Methicillin resistant Staphylococcus aureus in conditions classified elsewhere and of unspecified site  MI (myocardial infarction) ([place])  Other and unspecified hyperlipidemia  Unspecified essential hypertension     Reproductive/Obstetrics                             Anesthesia Physical  Anesthesia Plan  ASA: 4  Anesthesia Plan: General   Post-op Pain Management:    Induction: Intravenous  PONV Risk Score and Plan: 2 and Treatment may vary due to age or medical condition  Airway Management Planned: Oral ETT and LMA  Additional Equipment: None  Intra-op Plan:   Post-operative Plan:   Informed Consent: I have reviewed the patients History and Physical, chart, labs and discussed the procedure including the risks, benefits and alternatives for the proposed anesthesia with the patient or authorized representative who has indicated his/her understanding and acceptance.     Dental advisory given  Plan Discussed  with: Anesthesiologist  Anesthesia Plan Comments: (Lab Results      Component                Value               Date                      WBC                      11.2 (H)            07/03/2022                HGB                      13.3                07/03/2022                HCT                      39.7                07/03/2022                MCV                      87.3                07/03/2022  PLT                      210                 07/03/2022          )        Anesthesia Quick Evaluation

## 2022-07-04 NOTE — Op Note (Signed)
Operative Note  Date of operation: 07/04/2022  Preoperative diagnosis: Right hip displaced femoral neck fracture Postoperative diagnosis: Same  Procedure: Right hip direct anterior hemiarthroplasty  Implants: DePuy size 7 Actis femoral component with standard offset, size 45/28+5 metal bipolar hip ball  Surgeon: Lind Guest. Ninfa Linden, MD Assistant: Benita Stabile, PA-C  Anesthesia: General Antibiotics: 2 g IV Ancef Blood loss: 646 cc Complications: None  Indications: The patient is an active 88 year old female who unfortunately sustained a mechanical fall yesterday in which she suffered a displaced right hip femoral neck fracture.  She was brought to the emergency room where she was found to have this injury.  She is someone who does not ambulate but usually uses a rolling walker or scooter.  She does not have any dementia.  She stays at a facility that has assisted living.  I have spoken to her and her daughter about the recommendation for a partial versus total hip arthroplasty given that she has displaced right hip femoral neck fracture.  We talked about the risks of acute blood loss anemia, nerve or vessel injury, fracture, infection, DVT, dislocation and implant failure.  We talked about her goals being decreased pain, improve mobility and improve quality of life.  Procedure description: After informed consent was obtained and appropriate right hip was marked, she was brought to the operating room and general anesthesia was obtained while she was on the stretcher.  Next traction boots were placed on both her feet and she was placed supine on the Hana fracture table with a perineal post in place in both legs and inline skeletal traction but no traction applied.  We then assessed her right operative hip under direct fluoroscopy.  The right hip was prepped and draped with DuraPrep and sterile drapes.  Timeout was called and she identified correct patient correct right hip but then made an  incision just inferior and posterior to the anterior superior iliac spine care this obliquely down the leg.  Dissected down to the tensor fascia lata muscle and tensor fascia was then divided longitudinally to proceed with direct anterior anterior approach the hip.  We identified and cauterized circumflex and then identified the hip capsule and opened up the hip capsule finding a moderate hemarthrosis and a displaced hip femoral neck fracture.  We then made a femoral neck cut distal to the fracture but proximal to the lesser trochanter.  This was made with an oscillating saw and completed with osteotome.  We then remove the femoral head and take a measurement of the head allowing [place] to use a size 45 unipolar head.  Attention was then turned to the femur.  The leg was extended in a deducted and we were able to gain access to the femoral canal using a rondure and a lateralizer.  A medial retractor was placed medially and a Hohmann tractor was placed behind the greater trochanter.  A box cutting osteotome was used to enter the femoral canal.  We then began broaching using the Actis broaching system from DePuy from a size 0 going up to a size 7 with a size 7 in place we trialed a standard offset femoral neck with a 45/28 mm +1.5 hip ball.  This was reduced on the pelvis and we are pleased with range of motion and stability but felt like we needed just a little bit more likely.  We then dislocated the hip and the trial components.  We placed the real Actis femoral component size 7 with standard offset and  the real 45/28+5 femoral head ball.  This was reduced into the acetabulum and we are pleased with leg length, range of motion, stability and offset measured clinically and radiographically.  We then irrigated the soft tissue with normal saline solution.  The joint capsule was closed with interrupted #1 Ethibond suture followed by 1 Vicryl to close the tensor fascia.  0 Vicryl was used to close the deep tissue and 2-0  Vicryl used to close the subcutaneous tissue.  The skin was closed with staples.  An Aquacel dressing was applied.  The patient was awakened, extubated and taken to recovery room in stable condition with all counts being correct and no complications noted.  Of note Benita Stabile PA-C did assisted in entire case from beginning to end and his assistance was medically necessary and crucial for retracting soft tissues and management soft tissues as well as helping guide implant placement and he was involved directly with a layered closure of the wound.

## 2022-07-05 DIAGNOSIS — D696 Thrombocytopenia, unspecified: Secondary | ICD-10-CM

## 2022-07-05 DIAGNOSIS — N1832 Chronic kidney disease, stage 3b: Secondary | ICD-10-CM

## 2022-07-05 DIAGNOSIS — E876 Hypokalemia: Secondary | ICD-10-CM

## 2022-07-05 DIAGNOSIS — I2510 Atherosclerotic heart disease of native coronary artery without angina pectoris: Secondary | ICD-10-CM | POA: Diagnosis not present

## 2022-07-05 DIAGNOSIS — D649 Anemia, unspecified: Secondary | ICD-10-CM

## 2022-07-05 DIAGNOSIS — I10 Essential (primary) hypertension: Secondary | ICD-10-CM | POA: Diagnosis not present

## 2022-07-05 DIAGNOSIS — S72001A Fracture of unspecified part of neck of right femur, initial encounter for closed fracture: Secondary | ICD-10-CM | POA: Diagnosis not present

## 2022-07-05 LAB — CBC WITH DIFFERENTIAL/PLATELET
Abs Immature Granulocytes: 0.01 10*3/uL (ref 0.00–0.07)
Basophils Absolute: 0 10*3/uL (ref 0.0–0.1)
Basophils Relative: 0 %
Eosinophils Absolute: 0.3 10*3/uL (ref 0.0–0.5)
Eosinophils Relative: 4 %
HCT: 29.5 % — ABNORMAL LOW (ref 36.0–46.0)
Hemoglobin: 9.6 g/dL — ABNORMAL LOW (ref 12.0–15.0)
Immature Granulocytes: 0 %
Lymphocytes Relative: 16 %
Lymphs Abs: 1 10*3/uL (ref 0.7–4.0)
MCH: 29.1 pg (ref 26.0–34.0)
MCHC: 32.5 g/dL (ref 30.0–36.0)
MCV: 89.4 fL (ref 80.0–100.0)
Monocytes Absolute: 0.5 10*3/uL (ref 0.1–1.0)
Monocytes Relative: 7 %
Neutro Abs: 4.7 10*3/uL (ref 1.7–7.7)
Neutrophils Relative %: 73 %
Platelets: 145 10*3/uL — ABNORMAL LOW (ref 150–400)
RBC: 3.3 MIL/uL — ABNORMAL LOW (ref 3.87–5.11)
RDW: 13.8 % (ref 11.5–15.5)
WBC: 6.6 10*3/uL (ref 4.0–10.5)
nRBC: 0 % (ref 0.0–0.2)

## 2022-07-05 LAB — BASIC METABOLIC PANEL
Anion gap: 8 (ref 5–15)
BUN: 52 mg/dL — ABNORMAL HIGH (ref 8–23)
CO2: 24 mmol/L (ref 22–32)
Calcium: 7.8 mg/dL — ABNORMAL LOW (ref 8.9–10.3)
Chloride: 110 mmol/L (ref 98–111)
Creatinine, Ser: 1.49 mg/dL — ABNORMAL HIGH (ref 0.44–1.00)
GFR, Estimated: 33 mL/min — ABNORMAL LOW (ref >60)
Glucose, Bld: 96 mg/dL (ref 70–99)
Potassium: 3.4 mmol/L — ABNORMAL LOW (ref 3.5–5.1)
Sodium: 142 mmol/L (ref 135–145)

## 2022-07-05 LAB — MAGNESIUM: Magnesium: 1.9 mg/dL (ref 1.7–2.4)

## 2022-07-05 LAB — GLUCOSE, CAPILLARY: Glucose-Capillary: 69 mg/dL — ABNORMAL LOW (ref 70–99)

## 2022-07-05 MED ORDER — POTASSIUM CHLORIDE CRYS ER 20 MEQ PO TBCR
40.0000 meq | EXTENDED_RELEASE_TABLET | Freq: Once | ORAL | Status: AC
  Administered 2022-07-05: 40 meq via ORAL
  Filled 2022-07-05: qty 2

## 2022-07-05 NOTE — Evaluation (Signed)
Physical Therapy Evaluation Patient Details Name: Taylor Mendoza MRN: [id number] DOB: 01-29-1930 Today's Date: 07/05/2022  History of Present Illness  Pt s/p fall with R hip fx and now s/p hemi-arthroplasty by anterior direct approach.  Pt with hx of CAD and MI  Clinical Impression  Pt admitted as above and presenting with functional mobility limitations 2* decreased R LE strength/ROM, post op pain, balance deficits and limited endurance.  Pt currently requiring significant assist of two for safe performance of basic mobility tasks and would benefit from follow up SNF level rehab to maximize IND and safety prior to return to IND living situation.     Recommendations for follow up therapy are one component of a multi-disciplinary discharge planning process, led by the attending physician.  Recommendations may be updated based on patient status, additional functional criteria and insurance authorization.  Follow Up Recommendations Skilled nursing-short term rehab (<3 hours/day) Can patient physically be transported by private vehicle: No    Assistance Recommended at Discharge Frequent or constant Supervision/Assistance  Patient can return home with the following  A lot of help with walking and/or transfers;A lot of help with bathing/dressing/bathroom;Assistance with cooking/housework;Assist for transportation;Help with stairs or ramp for entrance    Equipment Recommendations None recommended by PT  Recommendations for Other Services       Functional Status Assessment Patient has had a recent decline in their functional status and demonstrates the ability to make significant improvements in function in a reasonable and predictable amount of time.     Precautions / Restrictions Precautions Precautions: Fall Restrictions Weight Bearing Restrictions: No RLE Weight Bearing: Weight bearing as tolerated      Mobility  Bed Mobility Overal bed mobility: Needs Assistance Bed Mobility:  Supine to Sit     Supine to sit: Mod assist, +2 for physical assistance, +2 for safety/equipment     General bed mobility comments: Increased time with cues for sequence, use of L LE to self assist and use of bed pad to complete rotation to EOB sitting    Transfers Overall transfer level: Needs assistance Equipment used: Rolling walker (2 wheels) Transfers: Sit to/from Stand Sit to Stand: Min assist, Mod assist, +2 physical assistance, +2 safety/equipment, From elevated surface           General transfer comment: cues for LE management and use of UEs to self assist.  Physical assist to bring wt up and fwd and to balance in standing.    Ambulation/Gait Ambulation/Gait assistance: Min assist, Mod assist, +2 safety/equipment Gait Distance (Feet): 19 Feet Assistive device: Rolling walker (2 wheels) Gait Pattern/deviations: Step-to pattern, Decreased step length - right, Decreased step length - left, Shuffle, Trunk flexed, Antalgic Gait velocity: decr     General Gait Details: cues for sequence, posture and position from RW with physical assist for support/balance and RW management  Stairs            Wheelchair Mobility    Modified Rankin (Stroke Patients Only)       Balance Overall balance assessment: Needs assistance Sitting-balance support: Feet supported, No upper extremity supported Sitting balance-Leahy Scale: Fair     Standing balance support: Bilateral upper extremity supported Standing balance-Leahy Scale: Poor                               Pertinent Vitals/Pain Pain Assessment Pain Assessment: 0-10 Pain Score: 3  Pain Location: R hip Pain Descriptors / Indicators: Aching,  Grimacing, Sore Pain Intervention(s): Limited activity within patient's tolerance, Monitored during session, Premedicated before session, Ice applied    Home Living Family/patient expects to be discharged to:: Skilled nursing facility                    Additional Comments: [facility] rehab setting    Prior Function Prior Level of Function : Independent/Modified Independent             Mobility Comments: used RW "most of the time"       Hand Dominance   Dominant Hand: Right    Extremity/Trunk Assessment   Upper Extremity Assessment Upper Extremity Assessment: Overall WFL for tasks assessed    Lower Extremity Assessment Lower Extremity Assessment: RLE deficits/detail RLE Deficits / Details: AAROM at hip to 80 flex and 15 abd; strength at hip 2-/5    Cervical / Trunk Assessment Cervical / Trunk Assessment: Kyphotic  Communication   Communication: No difficulties  Cognition Arousal/Alertness: Awake/alert Behavior During Therapy: WFL for tasks assessed/performed Overall Cognitive Status: Within Functional Limits for tasks assessed                                          General Comments      Exercises Total Joint Exercises Ankle Circles/Pumps: AROM, Both, 15 reps, Supine Quad Sets: AROM, Both, 5 reps, Supine Heel Slides: AAROM, Right, 15 reps, Supine Hip ABduction/ADduction: AAROM, Right, 15 reps, Supine   Assessment/Plan    PT Assessment Patient needs continued PT services  PT Problem List Decreased strength;Decreased range of motion;Decreased activity tolerance;Decreased balance;Decreased mobility;Decreased knowledge of use of DME;Pain       PT Treatment Interventions DME instruction;Gait training;Functional mobility training;Therapeutic activities;Therapeutic exercise;Patient/family education    PT Goals (Current goals can be found in the Care Plan section)  Acute Rehab PT Goals Patient Stated Goal: Walk again PT Goal Formulation: With patient/family Time For Goal Achievement: 07/18/22 Potential to Achieve Goals: Good    Frequency Min 3X/week     Co-evaluation               AM-PAC PT "6 Clicks" Mobility  Outcome Measure Help needed turning from your back to  your side while in a flat bed without using bedrails?: A Lot Help needed moving from lying on your back to sitting on the side of a flat bed without using bedrails?: A Lot Help needed moving to and from a bed to a chair (including a wheelchair)?: A Lot Help needed standing up from a chair using your arms (e.g., wheelchair or bedside chair)?: A Lot Help needed to walk in hospital room?: A Lot Help needed climbing 3-5 steps with a railing? : Total 6 Click Score: 11    End of Session Equipment Utilized During Treatment: Gait belt Activity Tolerance: Patient tolerated treatment well Patient left: in chair;with call bell/phone within reach;with chair alarm set Nurse Communication: Mobility status PT Visit Diagnosis: Unsteadiness on feet (R26.81);Difficulty in walking, not elsewhere classified (R26.2)    Time: 0102-7253 PT Time Calculation (min) (ACUTE ONLY): 31 min   Charges:   PT Evaluation $PT Eval Low Complexity: 1 Low PT Treatments $Gait Training: 8-22 mins        Debe Coder PT Acute Rehabilitation Services Pager [phone] Office [phone]   Cristi Gwynn 07/05/2022, 10:57 AM

## 2022-07-05 NOTE — TOC Initial Note (Signed)
Transition of Care [facility]) - Initial/Assessment Note    Patient Details  Name: Taylor Mendoza MRN: [id number] Date of Birth: [birth year]  Transition of Care [facility]) CM/SW Contact:    Lennart Pall, LCSW Phone Number: 07/05/2022, 3:22 PM  Clinical Narrative:                 Met with pt and daughter today to introduce TOC/CSW role with dc planning.  Pt admitted here following a fall in her [place] apt at [facility] and suffered a hip fx.  Surgery yesterday and, per therapy evals today, recommendation made for SNF.  Have spoken with [facility] who confirms they do have an available SNF bed at that facility and pt /family agreeable with plan to dc there.  Have begun insurance authorization but anticipate 1-2 day for decision. Facility cannot admit pt until Monday.  Pt/ family/ MD all aware.  Expected Discharge Plan: Skilled Nursing Facility Barriers to Discharge: Continued Medical Work up, Ship broker   Patient Goals and CMS Choice Patient states their goals for this hospitalization and ongoing recovery are:: return to [facility]      Expected Discharge Plan and Services Expected Discharge Plan: [place] In-house Referral: Clinical Social Work   Post Acute Care Choice: [place] Living arrangements for the past 2 months: [place]                                      Prior Living Arrangements/Services Living arrangements for the past 2 months: [place] Lives with:: Self Patient language and need for interpreter reviewed:: Yes        Need for Family Participation in Patient Care: No (Comment) Care giver support system in place?: Yes (comment)      Activities of Daily Living Home Assistive Devices/Equipment: Grab bars in shower ADL Screening (condition at time of admission) Patient's cognitive ability adequate to safely complete daily activities?: Yes Is the patient deaf or have  difficulty hearing?: No Does the patient have difficulty seeing, even when wearing glasses/contacts?: No Does the patient have difficulty concentrating, remembering, or making decisions?: No Patient able to express need for assistance with ADLs?: Yes Does the patient have difficulty dressing or bathing?: No Independently performs ADLs?: Yes (appropriate for developmental age) Does the patient have difficulty walking or climbing stairs?: Yes Weakness of Legs: Right Weakness of Arms/Hands: None  Permission Sought/Granted Permission sought to share information with : Family Supports, Chartered certified accountant granted to share information with : Yes, Verbal Permission Granted  Share Information with NAME: Felipa Eth     Permission granted to share info w Relationship: daughter  Permission granted to share info w Contact Information: [phone]  Emotional Assessment Appearance:: Appears stated age Attitude/Demeanor/Rapport: Gracious Affect (typically observed): Accepting Orientation: : Oriented to Self, Oriented to Place, Oriented to  Time, Oriented to Situation Alcohol / Substance Use: Not Applicable Psych Involvement: No (comment)  Admission diagnosis:  Femoral neck fracture (HCC) [S72.009A] Closed fracture of neck of right femur, initial encounter ([place]) [S72.001A] Fall, initial encounter [W19.XXXA] Displaced fracture of right femoral neck ([place]) [S72.001A] Patient Active Problem List   Diagnosis Date Noted   Hypokalemia 07/05/2022   Thrombocytopenia ([place]) 07/05/2022   Normocytic anemia 07/05/2022   Leukocytosis 07/04/2022   CKD (chronic kidney disease), stage III ([place]) 07/04/2022   Displaced fracture of right femoral neck ([place])  07/03/2022   Femoral neck fracture ([place]) 07/03/2022   DIZZINESS 08/11/2009   ACUT MI INFEROLAT WALL SUBSQT EPIS CARE 05/05/2009   MRSA 04/03/2009   HLD (hyperlipidemia) 04/03/2009   Essential hypertension 04/03/2009   CAD, NATIVE  VESSEL 03/12/2009   PCP:  [name], MD Pharmacy:   [id number], [place]. [place] [address] Phone: [phone] Fax: [place], [place] [address] Phone: [phone] Fax: [phone]     Social Determinants of Health (SDOH) Interventions    Readmission Risk Interventions    07/05/2022    3:18 PM  Readmission Risk Prevention Plan  Transportation Screening Complete  PCP or Specialist Appt within 5-7 Days Complete  Home Care Screening Complete  Medication Review (RN CM) Complete

## 2022-07-05 NOTE — Progress Notes (Signed)
PROGRESS NOTE    Taylor Mendoza  IWO:[id number] DOB: 12/23/1929 DOA: 07/03/2022 PCP: [name], MD   Brief Narrative:  88 y.o. female with medical history significant of HTN, CAD, HLD presented with a mechanical fall and was found acute displaced right femoral neck fracture.  Orthopedics was consulted.  She underwent surgical intervention on 07/04/2022 by orthopedics.  Assessment & Plan:   Acute displaced right femoral neck fracture after a mechanical fall -Status post right hip direct anterior hemiarthroplasty on 07/04/2022 by orthopedics. -Wound care/DVT prophylaxis/activity as per orthopedics recommendations.  Continue pain management.  Fall precautions. -PT eval.  ?  Junctional tachycardia -Preoperative EKG was suggestive of?  Junctional tachycardia.  Prior hospitalist discussed with cardiology on-call who recommended 2D echo.  2D echo showed EF of 50% with grade 1 dysfunction and moderate aortic regurgitation. -Currently heart rate controlled.  -This can be followed up as an outpatient with cardiology  CAD Hyperlipidemia Hypertension -No chest pain.  Stable.  Continue statin and metoprolol  Chronic kidney disease stage IIIb -Creatinine stable.  Monitor intermittently  Leukocytosis -Reactive.  Resolved  Hypokalemia -Replace.  Repeat a.m.  Thrombocytopenia -Monitor.  Normocytic anemia -Possibly from blood loss from right hip fracture.  Hemoglobin currently stable.  Monitor.   DVT prophylaxis: Aspirin 325 mg daily as per Orthopedics  code Status: DNR Family Communication: Daughter at bedside on 07/04/2022.  None at bedside today. Disposition Plan: Status is: Inpatient Remains inpatient appropriate because: Of severity of illness    Consultants: Orthopedics Procedures: None  Antimicrobials: None   Subjective: Patient seen and examined at bedside.  Still complains of intermittent right hip pain.  No overnight fever, agitation, vomiting reported.  Has not  worked with physical therapy yet. Objective: Vitals:   07/04/22 1449 07/04/22 1520 07/04/22 2150 07/05/22 0134  BP:  (!) 132/49 126/60 (!) 117/49  Pulse: (!) 58  76 60  Resp:  '16 17 17  '$ Temp:   98.3 F (36.8 C) 98.2 F (36.8 C)  TempSrc:   Oral Oral  SpO2:   95% 93%  Weight:      Height:        Intake/Output Summary (Last 24 hours) at 07/05/2022 0737 Last data filed at 07/05/2022 0645 Gross per 24 hour  Intake 2601.18 ml  Output 850 ml  Net 1751.18 ml    Filed Weights   07/04/22 0207  Weight: 54.3 kg    Examination:  General: On room air.  No distress.  Elderly female lying in bed. ENT/neck: No thyromegaly.  JVD is not elevated  respiratory: Decreased breath sounds at bases bilaterally with some crackles; no wheezing CVS: S1-S2 heard, rate controlled currently Abdominal: Soft, nontender, slightly distended; no organomegaly, bowel sounds are heard Extremities: Trace lower extremity edema; no cyanosis  CNS: Awake.  Slow to respond.  Poor historian.  No focal neurologic deficit.  Moves extremities Lymph: No obvious lymphadenopathy Skin: No obvious ecchymosis/lesions  psych: Mostly flat affect.  No signs of agitation.    Data Reviewed: I have personally reviewed following labs and imaging studies  CBC: Recent Labs  Lab 07/03/22 1552 07/03/22 2123 07/04/22 0340 07/05/22 0335  WBC 11.2* 9.3 5.9 6.6  NEUTROABS 9.7*  --   --  4.7  HGB 13.3 12.4 11.5* 9.6*  HCT 39.7 37.4 35.5* 29.5*  MCV 87.3 89.0 90.6 89.4  PLT 210 197 178 145*    Basic Metabolic Panel: Recent Labs  Lab 07/03/22 1552 07/03/22 2123 07/04/22 0340 07/05/22 0335  NA 141  --  143 142  K 3.6  --  4.1 3.4*  CL 107  --  111 110  CO2 24  --  23 24  GLUCOSE 98  --  153* 96  BUN 55*  --  53* 52*  CREATININE 1.72* 1.46* 1.58* 1.49*  CALCIUM 9.2  --  8.8* 7.8*  MG  --   --   --  1.9    GFR: Estimated Creatinine Clearance: 21.9 mL/min (A) (by C-G formula based on SCr of 1.49 mg/dL (H)). Liver  Function Tests: Recent Labs  Lab 07/04/22 0340  AST 18  ALT 6  ALKPHOS 74  BILITOT 0.7  PROT 6.1*  ALBUMIN 3.3*    No results for input(s): "LIPASE", "AMYLASE" in the last 168 hours. No results for input(s): "AMMONIA" in the last 168 hours. Coagulation Profile: Recent Labs  Lab 07/03/22 1552  INR 1.1    Cardiac Enzymes: Recent Labs  Lab 07/03/22 1552  CKTOTAL 118    BNP (last 3 results) No results for input(s): "PROBNP" in the last 8760 hours. HbA1C: No results for input(s): "HGBA1C" in the last 72 hours. CBG: No results for input(s): "GLUCAP" in the last 168 hours. Lipid Profile: No results for input(s): "CHOL", "HDL", "LDLCALC", "TRIG", "CHOLHDL", "LDLDIRECT" in the last 72 hours. Thyroid Function Tests: No results for input(s): "TSH", "T4TOTAL", "FREET4", "T3FREE", "THYROIDAB" in the last 72 hours. Anemia Panel: No results for input(s): "VITAMINB12", "FOLATE", "FERRITIN", "TIBC", "IRON", "RETICCTPCT" in the last 72 hours. Sepsis Labs: No results for input(s): "PROCALCITON", "LATICACIDVEN" in the last 168 hours.  Recent Results (from the past 240 hour(s))  Surgical PCR screen     Status: None   Collection Time: 07/04/22  1:03 AM   Specimen: Nasal Mucosa; Nasal Swab  Result Value Ref Range Status   MRSA, PCR NEGATIVE NEGATIVE Final   Staphylococcus aureus NEGATIVE NEGATIVE Final    Comment: (NOTE) The Xpert SA Assay (FDA approved for NASAL specimens in patients 76 years of age and older), is one component of a comprehensive surveillance program. It is not intended to diagnose infection nor to guide or monitor treatment. Performed at [facility], [place] [address] [place], [place] [address]          Radiology Studies: Pelvis Portable  Result Date: 07/04/2022 CLINICAL DATA:  Postoperative hip replacement EXAM: PORTABLE PELVIS 1-2 VIEWS COMPARISON:  Pelvis and right hip radiographs 07/03/2022 FINDINGS: Interval total right hip  arthroplasty. No perihardware lucency is seen to indicate hardware failure or loosening. Expected postoperative changes including subcutaneous air and soft tissue swelling about the right hip. Lateral right hip surgical skin staples. Mild pubic symphysis osteoarthritis. No acute fracture or dislocation. IMPRESSION: Interval total right hip arthroplasty without evidence of hardware failure. Electronically Signed   By: Yvonne Kendall M.D.   On: 07/04/2022 12:43   DG HIP UNILAT WITH PELVIS 1V RIGHT  Result Date: 07/04/2022 CLINICAL DATA:  Fluoroscopic assistance for right hip arthroplasty EXAM: DG HIP (WITH OR WITHOUT PELVIS) 1V RIGHT COMPARISON:  None Available. FINDINGS: Fluoroscopic images show right hip arthroplasty. Fluoroscopy time 7 seconds. Radiation dose 0.8 mGy. IMPRESSION: Fluoroscopic assistance was provided for right hip arthroplasty. Electronically Signed   By: Elmer Picker M.D.   On: 07/04/2022 11:59   DG C-Arm 1-60 Min-No Report  Result Date: 07/04/2022 Fluoroscopy was utilized by the requesting physician.  No radiographic interpretation.   ECHOCARDIOGRAM COMPLETE  Result Date: 07/04/2022    ECHOCARDIOGRAM REPORT   Patient Name:   Remona  Sherryl Manges Date of Exam: 07/04/2022 Medical Rec #:  [id number]       Height:       66.0 in Accession #:    [id number]      Weight:       119.7 lb Date of Birth:  1930-09-27       BSA:          1.608 m Patient Age:    43 years        BP:           126/77 mmHg Patient Gender: F               HR:           46 bpm. Exam Location:  Inpatient Procedure: 2D Echo, Cardiac Doppler, Color Doppler and Intracardiac            Opacification Agent STAT ECHO Indications:     Abnormal ECG R94.31  History:         Patient has prior history of Echocardiogram examinations, most                  recent 10/04/2016. CAD and Previous Myocardial Infarction;                  Arrythmias:Bradycardia. Fractured hip.  Sonographer:     Darlina Sicilian RDCS Referring Phys:  [place] Diagnosing Phys: Kirk Ruths McleanMD IMPRESSIONS  1. Left ventricular ejection fraction, by estimation, is 50%. The left ventricle has mildly decreased function. The left ventricle demonstrates global hypokinesis. There is mild concentric left ventricular hypertrophy. Left ventricular diastolic parameters are consistent with Grade I diastolic dysfunction (impaired relaxation).  2. Right ventricular systolic function is normal. The right ventricular size is normal. There is moderately elevated pulmonary artery systolic pressure. The estimated right ventricular systolic pressure is 16.9 mmHg.  3. Left atrial size was moderately dilated.  4. Right atrial size was mildly dilated.  5. The mitral valve is normal in structure. Mild mitral valve regurgitation. No evidence of mitral stenosis.  6. The aortic valve is tricuspid. There is mild calcification of the aortic valve. Aortic valve regurgitation is moderate.  7. Aortic dilatation noted. There is severe dilatation of the ascending aorta, measuring 52 mm.  8. The inferior vena cava is dilated in size with <50% respiratory variability, suggesting right atrial pressure of 15 mmHg.  9. A small pericardial effusion is present. FINDINGS  Left Ventricle: Left ventricular ejection fraction, by estimation, is 50%. The left ventricle has mildly decreased function. The left ventricle demonstrates global hypokinesis. Definity contrast agent was given IV to delineate the left ventricular endocardial borders. The left ventricular internal cavity size was normal in size. There is mild concentric left ventricular hypertrophy. Left ventricular diastolic parameters are consistent with Grade I diastolic dysfunction (impaired relaxation). Right Ventricle: The right ventricular size is normal. No increase in right ventricular wall thickness. Right ventricular systolic function is normal. There is moderately elevated pulmonary artery systolic pressure. The tricuspid regurgitant velocity  is 2.99 m/s, and with an assumed right atrial pressure of 15 mmHg, the estimated right ventricular systolic pressure is 67.8 mmHg. Left Atrium: Left atrial size was moderately dilated. Right Atrium: Right atrial size was mildly dilated. Pericardium: A small pericardial effusion is present. Mitral Valve: The mitral valve is normal in structure. Mild mitral valve regurgitation. No evidence of mitral valve stenosis. Tricuspid Valve: The tricuspid valve is normal in structure. Tricuspid valve regurgitation is mild. Aortic  Valve: The aortic valve is tricuspid. There is mild calcification of the aortic valve. Aortic valve regurgitation is moderate. Aortic regurgitation PHT measures 842 msec. Pulmonic Valve: The pulmonic valve was normal in structure. Pulmonic valve regurgitation is trivial. Aorta: Aortic dilatation noted. There is severe dilatation of the ascending aorta, measuring 52 mm. Venous: The inferior vena cava is dilated in size with less than 50% respiratory variability, suggesting right atrial pressure of 15 mmHg. IAS/Shunts: No atrial level shunt detected by color flow Doppler.  LEFT VENTRICLE PLAX 2D LVIDd:         5.60 cm     Diastology LVIDs:         3.20 cm     LV e' medial:    3.23 cm/s LV PW:         1.00 cm     LV E/e' medial:  13.0 LV IVS:        0.90 cm     LV e' lateral:   3.38 cm/s LVOT diam:     2.00 cm     LV E/e' lateral: 12.4 LVOT Area:     3.14 cm  LV Volumes (MOD) LV vol d, MOD A2C: 85.5 ml LV vol d, MOD A4C: 91.5 ml LV vol s, MOD A2C: 48.3 ml LV vol s, MOD A4C: 48.2 ml LV SV MOD A2C:     37.2 ml LV SV MOD A4C:     91.5 ml LV SV MOD BP:      41.3 ml RIGHT VENTRICLE RV Basal diam:  4.70 cm RV Mid diam:    2.40 cm RV S prime:     15.20 cm/s TAPSE (M-mode): 2.0 cm LEFT ATRIUM             Index        RIGHT ATRIUM           Index LA diam:        3.20 cm 1.99 cm/m   RA Area:     18.80 cm LA Vol (A2C):   90.4 ml 56.22 ml/m  RA Volume:   48.00 ml  29.85 ml/m LA Vol (A4C):   39.3 ml 24.44 ml/m  LA Biplane Vol: 59.8 ml 37.19 ml/m  AORTIC VALVE AI PHT:      842 msec  AORTA Ao Root diam: 3.10 cm Ao STJ diam:  3.1 cm Ao Asc diam:  4.90 cm MITRAL VALVE               TRICUSPID VALVE MV Area (PHT): 1.94 cm    TR Peak grad:   35.8 mmHg MV Decel Time: 391 msec    TR Vmax:        299.00 cm/s MV E velocity: 42.00 cm/s MV A velocity: 84.80 cm/s  SHUNTS MV E/A ratio:  0.50        Systemic Diam: 2.00 cm Dalton McleanMD Electronically signed by Franki Monte Signature Date/Time: 07/04/2022/10:12:09 AM    Final (Updated)    DG Knee Complete 4 Views Right  Result Date: 07/03/2022 CLINICAL DATA:  Right hip fracture EXAM: RIGHT KNEE - COMPLETE 4+ VIEW COMPARISON:  None FINDINGS: Anterior soft tissue swelling. Osteoarthritis most pronounced in the lateral compartment. No evidence of fracture. Question old healed fracture of the proximal fibula. IMPRESSION: No acute fracture. Mild soft tissue swelling. Osteoarthritis most pronounced in the lateral compartment. Question old healed fracture of the proximal fibula. Electronically Signed   By: Nelson Chimes M.D.   On: 07/03/2022 17:16  DG Chest 1 View  Result Date: 07/03/2022 CLINICAL DATA:  Hip fracture EXAM: CHEST  1 VIEW COMPARISON:  12/24/2015 FINDINGS: Chronic cardiomegaly and aortic tortuosity. The lungs are clear. The vascularity is normal. No effusions. No visible rib fracture. IMPRESSION: Cardiomegaly and aortic atherosclerosis.  No active disease. Electronically Signed   By: Nelson Chimes M.D.   On: 07/03/2022 17:14   DG Hip Unilat With Pelvis 2-3 Views Right  Result Date: 07/03/2022 CLINICAL DATA:  Fall with pain EXAM: DG HIP (WITH OR WITHOUT PELVIS) 2-3V RIGHT COMPARISON:  None FINDINGS: Femoral neck fracture with mild displacement. No intertrochanteric component. Right hemipelvis is negative. IMPRESSION: Displaced femoral neck fracture on the right. Electronically Signed   By: Nelson Chimes M.D.   On: 07/03/2022 17:14        Scheduled Meds:   aspirin EC  325 mg Oral Q breakfast   atorvastatin  10 mg Oral QHS   carbidopa-levodopa  1 tablet Oral TID   docusate sodium  100 mg Oral BID   metoprolol succinate  50 mg Oral Daily    morphine injection  2 mg Intravenous Once   pantoprazole  20 mg Oral Daily   Continuous Infusions:  sodium chloride 75 mL/hr at 07/05/22 2423          Aline August, MD [facility] 07/05/2022, 7:37 AM

## 2022-07-05 NOTE — NC FL2 (Signed)
[place] LEVEL OF CARE SCREENING TOOL     IDENTIFICATION  Patient Name: Taylor Mendoza Birthdate: 11-04-1930 Sex: female Admission Date (Current Location): 07/03/2022  [facility] [place] Number:  Herbalist and Address:         Provider Number: [id number]  Attending Physician Name and Address:  Aline August, MD  Relative Name and Phone Number:  Eduard Clos [phone]    Current Level of Care: Hospital Recommended Level of Care: [place] Prior Approval Number:    Date Approved/Denied:   PASRR Number: [id number]  Discharge Plan: SNF    Current Diagnoses: Patient Active Problem List   Diagnosis Date Noted   Hypokalemia 07/05/2022   Thrombocytopenia ([place]) 07/05/2022   Normocytic anemia 07/05/2022   Leukocytosis 07/04/2022   CKD (chronic kidney disease), stage III ([place]) 07/04/2022   Displaced fracture of right femoral neck ([place]) 07/03/2022   Femoral neck fracture ([place]) 07/03/2022   DIZZINESS 08/11/2009   ACUT MI INFEROLAT WALL SUBSQT EPIS CARE 05/05/2009   MRSA 04/03/2009   HLD (hyperlipidemia) 04/03/2009   Essential hypertension 04/03/2009   CAD, NATIVE VESSEL 03/12/2009    Orientation RESPIRATION BLADDER Height & Weight     Self, Time, Situation, Place  Normal Continent Weight: 119 lb 11.4 oz (54.3 kg) Height:  5' 5.98" (167.6 cm)  BEHAVIORAL SYMPTOMS/MOOD NEUROLOGICAL BOWEL NUTRITION STATUS      Continent    AMBULATORY STATUS COMMUNICATION OF NEEDS Skin   Limited Assist Verbally Other (Comment) (surgical incision only)                       Personal Care Assistance Level of Assistance  Bathing, Dressing Bathing Assistance: Limited assistance   Dressing Assistance: Limited assistance     Functional Limitations Info             Plain  PT (By licensed PT), OT (By licensed OT)     PT Frequency: 5x/wk OT Frequency: 5x/wk            Contractures  Contractures Info: Not present    Additional Factors Info  Code Status, Allergies Code Status Info: DNR Allergies Info: Amlodipine, Alendronate, Ramipril           Current Medications (07/05/2022):  This is the current hospital active medication list Current Facility-Administered Medications  Medication Dose Route Frequency Provider Last Rate Last Admin   acetaminophen (TYLENOL) tablet 325-650 mg  325-650 mg Oral Q6H PRN Mcarthur Rossetti, MD       aspirin EC tablet 325 mg  325 mg Oral Q breakfast Mcarthur Rossetti, MD   325 mg at 07/05/22 0819   atorvastatin (LIPITOR) tablet 10 mg  10 mg Oral QHS Mcarthur Rossetti, MD   10 mg at 07/04/22 2108   carbidopa-levodopa (SINEMET IR) 25-100 MG per tablet immediate release 1 tablet  1 tablet Oral TID Mcarthur Rossetti, MD   1 tablet at 07/05/22 0820   docusate sodium (COLACE) capsule 100 mg  100 mg Oral BID Mcarthur Rossetti, MD   100 mg at 07/05/22 0819   fentaNYL (SUBLIMAZE) injection 50 mcg  50 mcg Intravenous Q30 min PRN Mcarthur Rossetti, MD   50 mcg at 07/03/22 1549   hydrALAZINE (APRESOLINE) injection 10 mg  10 mg Intravenous Q4H PRN Mcarthur Rossetti, MD       HYDROcodone-acetaminophen (NORCO) 7.5-325 MG per tablet 1-2 tablet  1-2 tablet Oral Q4H PRN  Mcarthur Rossetti, MD   1 tablet at 07/05/22 1316   HYDROcodone-acetaminophen (NORCO/VICODIN) 5-325 MG per tablet 1-2 tablet  1-2 tablet Oral Q6H PRN Mcarthur Rossetti, MD   2 tablet at 07/03/22 2255   HYDROcodone-acetaminophen (NORCO/VICODIN) 5-325 MG per tablet 1-2 tablet  1-2 tablet Oral Q4H PRN Mcarthur Rossetti, MD       menthol-cetylpyridinium (CEPACOL) lozenge 3 mg  1 lozenge Oral PRN Mcarthur Rossetti, MD       Or   phenol (CHLORASEPTIC) mouth spray 1 spray  1 spray Mouth/Throat PRN Mcarthur Rossetti, MD       metoCLOPramide (REGLAN) tablet 5-10 mg  5-10 mg Oral Q8H PRN Mcarthur Rossetti, MD       Or    metoCLOPramide (REGLAN) injection 5-10 mg  5-10 mg Intravenous Q8H PRN Mcarthur Rossetti, MD   10 mg at 07/05/22 1317   metoprolol succinate (TOPROL-XL) 24 hr tablet 50 mg  50 mg Oral Daily Mcarthur Rossetti, MD   50 mg at 07/05/22 0820   morphine (PF) 2 MG/ML injection 0.5 mg  0.5 mg Intravenous Q2H PRN Mcarthur Rossetti, MD   0.5 mg at 07/05/22 1316   morphine (PF) 2 MG/ML injection 0.5-1 mg  0.5-1 mg Intravenous Q2H PRN Mcarthur Rossetti, MD   1 mg at 07/04/22 2034   morphine (PF) 2 MG/ML injection 2 mg  2 mg Intravenous Once Mcarthur Rossetti, MD       ondansetron [facility]) tablet 4 mg  4 mg Oral Q6H PRN Mcarthur Rossetti, MD       Or   ondansetron [facility]) injection 4 mg  4 mg Intravenous Q6H PRN Mcarthur Rossetti, MD   4 mg at 07/04/22 2107   Oral care mouth rinse  15 mL Mouth Rinse PRN Aline August, MD       pantoprazole (PROTONIX) EC tablet 20 mg  20 mg Oral Daily Mcarthur Rossetti, MD   20 mg at 07/05/22 7846   senna-docusate (Senokot-S) tablet 1 tablet  1 tablet Oral QHS PRN Mcarthur Rossetti, MD         Discharge Medications: Please see discharge summary for a list of discharge medications.  Relevant Imaging Results:  Relevant Lab Results:   Additional Information SS# [id number]  Lennart Pall, LCSW

## 2022-07-06 DIAGNOSIS — N1832 Chronic kidney disease, stage 3b: Secondary | ICD-10-CM | POA: Diagnosis not present

## 2022-07-06 DIAGNOSIS — D72829 Elevated white blood cell count, unspecified: Secondary | ICD-10-CM

## 2022-07-06 DIAGNOSIS — D696 Thrombocytopenia, unspecified: Secondary | ICD-10-CM

## 2022-07-06 DIAGNOSIS — S72001A Fracture of unspecified part of neck of right femur, initial encounter for closed fracture: Secondary | ICD-10-CM | POA: Diagnosis not present

## 2022-07-06 DIAGNOSIS — I10 Essential (primary) hypertension: Secondary | ICD-10-CM | POA: Diagnosis not present

## 2022-07-06 DIAGNOSIS — D649 Anemia, unspecified: Secondary | ICD-10-CM

## 2022-07-06 LAB — BASIC METABOLIC PANEL
Anion gap: 6 (ref 5–15)
BUN: 36 mg/dL — ABNORMAL HIGH (ref 8–23)
CO2: 24 mmol/L (ref 22–32)
Calcium: 8.2 mg/dL — ABNORMAL LOW (ref 8.9–10.3)
Chloride: 109 mmol/L (ref 98–111)
Creatinine, Ser: 1.29 mg/dL — ABNORMAL HIGH (ref 0.44–1.00)
GFR, Estimated: 40 mL/min — ABNORMAL LOW (ref >60)
Glucose, Bld: 98 mg/dL (ref 70–99)
Potassium: 4.3 mmol/L (ref 3.5–5.1)
Sodium: 139 mmol/L (ref 135–145)

## 2022-07-06 LAB — CBC WITH DIFFERENTIAL/PLATELET
Abs Immature Granulocytes: 0.03 10*3/uL (ref 0.00–0.07)
Basophils Absolute: 0 10*3/uL (ref 0.0–0.1)
Basophils Relative: 0 %
Eosinophils Absolute: 0.3 10*3/uL (ref 0.0–0.5)
Eosinophils Relative: 4 %
HCT: 28.7 % — ABNORMAL LOW (ref 36.0–46.0)
Hemoglobin: 9.3 g/dL — ABNORMAL LOW (ref 12.0–15.0)
Immature Granulocytes: 1 %
Lymphocytes Relative: 13 %
Lymphs Abs: 0.8 10*3/uL (ref 0.7–4.0)
MCH: 29.1 pg (ref 26.0–34.0)
MCHC: 32.4 g/dL (ref 30.0–36.0)
MCV: 89.7 fL (ref 80.0–100.0)
Monocytes Absolute: 0.5 10*3/uL (ref 0.1–1.0)
Monocytes Relative: 8 %
Neutro Abs: 4.6 10*3/uL (ref 1.7–7.7)
Neutrophils Relative %: 74 %
Platelets: 144 10*3/uL — ABNORMAL LOW (ref 150–400)
RBC: 3.2 MIL/uL — ABNORMAL LOW (ref 3.87–5.11)
RDW: 13.8 % (ref 11.5–15.5)
WBC: 6.2 10*3/uL (ref 4.0–10.5)
nRBC: 0 % (ref 0.0–0.2)

## 2022-07-06 LAB — MAGNESIUM: Magnesium: 1.8 mg/dL (ref 1.7–2.4)

## 2022-07-06 MED ORDER — ASPIRIN 325 MG PO TBEC: 325.0000 mg | DELAYED_RELEASE_TABLET | Freq: Every day | ORAL | 0 refills | Status: DC

## 2022-07-06 MED ORDER — HYDROCODONE-ACETAMINOPHEN 5-325 MG PO TABS: 1.0000 | ORAL_TABLET | Freq: Four times a day (QID) | ORAL | 0 refills | Status: DC | PRN

## 2022-07-06 NOTE — Progress Notes (Signed)
Subjective: 2 Days Post-Op Procedure(s) (LRB): RIGHT ANTERIOR HIP REPLACEMENT  (Right) Patient reports pain as mild.    Objective: Vital signs in last 24 hours: Temp:  [98.2 F (36.8 C)-99.4 F (37.4 C)] 98.9 F (37.2 C) (07/29 0628) Pulse Rate:  [64-84] 84 (07/29 0628) Resp:  [16-18] 18 (07/29 0628) BP: (129-152)/(54-80) 129/70 (07/29 0628) SpO2:  [92 %-95 %] 94 % (07/29 0628)  Intake/Output from previous day: 07/28 0701 - 07/29 0700 In: 480 [P.O.:480] Out: 950 [Urine:950] Intake/Output this shift: No intake/output data recorded.  Recent Labs    07/03/22 1552 07/03/22 2123 07/04/22 0340 07/05/22 0335 07/06/22 0403  HGB 13.3 12.4 11.5* 9.6* 9.3*   Recent Labs    07/05/22 0335 07/06/22 0403  WBC 6.6 6.2  RBC 3.30* 3.20*  HCT 29.5* 28.7*  PLT 145* 144*   Recent Labs    07/05/22 0335 07/06/22 0403  NA 142 139  K 3.4* 4.3  CL 110 109  CO2 24 24  BUN 52* 36*  CREATININE 1.49* 1.29*  GLUCOSE 96 98  CALCIUM 7.8* 8.2*   Recent Labs    07/03/22 1552  INR 1.1    Sensation intact distally Intact pulses distally Dorsiflexion/Plantar flexion intact Incision: scant drainage   Assessment/Plan: 2 Days Post-Op Procedure(s) (LRB): RIGHT ANTERIOR HIP REPLACEMENT  (Right) Up with therapy Discharge to SNF by early next week.      Mcarthur Rossetti 07/06/2022, 9:30 AM

## 2022-07-06 NOTE — Discharge Instructions (Signed)

## 2022-07-06 NOTE — Progress Notes (Signed)
PROGRESS NOTE    CHIHIRO FREY  VOZ:[id number] DOB: 12-30-1929 DOA: 07/03/2022 PCP: [name], MD   Brief Narrative:  88 y.o. female with medical history significant of HTN, CAD, HLD presented with a mechanical fall and was found acute displaced right femoral neck fracture.  Orthopedics was consulted.  She underwent surgical intervention on 07/04/2022 by orthopedics.  Assessment & Plan:   Acute displaced right femoral neck fracture after a mechanical fall -Status post right hip direct anterior hemiarthroplasty on 07/04/2022 by orthopedics. -Wound care/DVT prophylaxis/activity as per orthopedics recommendations.  Continue pain management.  Fall precautions. -PT recommended SNF placement.  TOC consulted.  ?  Junctional tachycardia -Preoperative EKG was suggestive of?  Junctional tachycardia.  Prior hospitalist discussed with cardiology on-call who recommended 2D echo.  2D echo showed EF of 50% with grade 1 dysfunction and moderate aortic regurgitation. -Currently heart rate controlled.  -This can be followed up as an outpatient with cardiology  CAD Hyperlipidemia Hypertension -No chest pain.  Stable.  Continue statin and metoprolol  Chronic kidney disease stage IIIb -Creatinine stable.  Monitor intermittently  Leukocytosis -Reactive.  Resolved  Hypokalemia -Resolved  Thrombocytopenia -Monitor intermittently.  No signs of bleeding.  Normocytic anemia -Possibly from blood loss from right hip fracture.  Hemoglobin currently stable.  Monitor.   DVT prophylaxis: Aspirin 325 mg daily as per Orthopedics  code Status: DNR Family Communication: Daughter at bedside on 07/04/2022.  None at bedside today. Disposition Plan: Status is: Inpatient Remains inpatient appropriate because: Of severity of illness.  Will need SNF placement  Consultants: Orthopedics Procedures: right hip direct anterior hemiarthroplasty on 07/04/2022 by orthopedics  Antimicrobials:  None   Subjective: Patient seen and examined at bedside.  No vomiting, agitation, fever or worsening shortness of breath reported.  Complains of intermittent right hip pain.   Objective: Vitals:   07/05/22 0905 07/05/22 1331 07/05/22 1741 07/06/22 0628  BP: (!) 123/54 (!) 152/54 (!) 143/80 129/70  Pulse: 83 64 78 84  Resp: '17 18 16 18  '$ Temp: 98.1 F (36.7 C) 98.2 F (36.8 C) 99.4 F (37.4 C) 98.9 F (37.2 C)  TempSrc: Oral Oral Oral   SpO2: 97% 95% 92% 94%  Weight:      Height:        Intake/Output Summary (Last 24 hours) at 07/06/2022 0759 Last data filed at 07/06/2022 0600 Gross per 24 hour  Intake 480 ml  Output 950 ml  Net -470 ml    Filed Weights   07/04/22 0207  Weight: 54.3 kg    Examination:  General: On room air.  No distress.  Elderly female lying in bed. Slow to respond. respiratory: Decreased breath sounds at bases bilaterally with some crackles CVS: Currently rate controlled; S1-S2 heard  abdominal: Soft, nontender, slightly distended, no organomegaly; bowel sounds are heard  extremities: Trace lower extremity edema; no clubbing.     Data Reviewed: I have personally reviewed following labs and imaging studies  CBC: Recent Labs  Lab 07/03/22 1552 07/03/22 2123 07/04/22 0340 07/05/22 0335 07/06/22 0403  WBC 11.2* 9.3 5.9 6.6 6.2  NEUTROABS 9.7*  --   --  4.7 4.6  HGB 13.3 12.4 11.5* 9.6* 9.3*  HCT 39.7 37.4 35.5* 29.5* 28.7*  MCV 87.3 89.0 90.6 89.4 89.7  PLT 210 197 178 145* 144*    Basic Metabolic Panel: Recent Labs  Lab 07/03/22 1552 07/03/22 2123 07/04/22 0340 07/05/22 0335 07/06/22 0403  NA 141  --  143 142 139  K 3.6  --  4.1 3.4* 4.3  CL 107  --  111 110 109  CO2 24  --  '23 24 24  '$ GLUCOSE 98  --  153* 96 98  BUN 55*  --  53* 52* 36*  CREATININE 1.72* 1.46* 1.58* 1.49* 1.29*  CALCIUM 9.2  --  8.8* 7.8* 8.2*  MG  --   --   --  1.9 1.8    GFR: Estimated Creatinine Clearance: 25.3 mL/min (A) (by C-G formula based on SCr of  1.29 mg/dL (H)). Liver Function Tests: Recent Labs  Lab 07/04/22 0340  AST 18  ALT 6  ALKPHOS 74  BILITOT 0.7  PROT 6.1*  ALBUMIN 3.3*    No results for input(s): "LIPASE", "AMYLASE" in the last 168 hours. No results for input(s): "AMMONIA" in the last 168 hours. Coagulation Profile: Recent Labs  Lab 07/03/22 1552  INR 1.1    Cardiac Enzymes: Recent Labs  Lab 07/03/22 1552  CKTOTAL 118    BNP (last 3 results) No results for input(s): "PROBNP" in the last 8760 hours. HbA1C: No results for input(s): "HGBA1C" in the last 72 hours. CBG: Recent Labs  Lab 07/05/22 1738  GLUCAP 69*   Lipid Profile: No results for input(s): "CHOL", "HDL", "LDLCALC", "TRIG", "CHOLHDL", "LDLDIRECT" in the last 72 hours. Thyroid Function Tests: No results for input(s): "TSH", "T4TOTAL", "FREET4", "T3FREE", "THYROIDAB" in the last 72 hours. Anemia Panel: No results for input(s): "VITAMINB12", "FOLATE", "FERRITIN", "TIBC", "IRON", "RETICCTPCT" in the last 72 hours. Sepsis Labs: No results for input(s): "PROCALCITON", "LATICACIDVEN" in the last 168 hours.  Recent Results (from the past 240 hour(s))  Surgical PCR screen     Status: None   Collection Time: 07/04/22  1:03 AM   Specimen: Nasal Mucosa; Nasal Swab  Result Value Ref Range Status   MRSA, PCR NEGATIVE NEGATIVE Final   Staphylococcus aureus NEGATIVE NEGATIVE Final    Comment: (NOTE) The Xpert SA Assay (FDA approved for NASAL specimens in patients 38 years of age and older), is one component of a comprehensive surveillance program. It is not intended to diagnose infection nor to guide or monitor treatment. Performed at [facility], [place] [address], [place], [place] [address]          Radiology Studies: Pelvis Portable  Result Date: 07/04/2022 CLINICAL DATA:  Postoperative hip replacement EXAM: PORTABLE PELVIS 1-2 VIEWS COMPARISON:  Pelvis and right hip radiographs 07/03/2022 FINDINGS: Interval total  right hip arthroplasty. No perihardware lucency is seen to indicate hardware failure or loosening. Expected postoperative changes including subcutaneous air and soft tissue swelling about the right hip. Lateral right hip surgical skin staples. Mild pubic symphysis osteoarthritis. No acute fracture or dislocation. IMPRESSION: Interval total right hip arthroplasty without evidence of hardware failure. Electronically Signed   By: Yvonne Kendall M.D.   On: 07/04/2022 12:43   DG HIP UNILAT WITH PELVIS 1V RIGHT  Result Date: 07/04/2022 CLINICAL DATA:  Fluoroscopic assistance for right hip arthroplasty EXAM: DG HIP (WITH OR WITHOUT PELVIS) 1V RIGHT COMPARISON:  None Available. FINDINGS: Fluoroscopic images show right hip arthroplasty. Fluoroscopy time 7 seconds. Radiation dose 0.8 mGy. IMPRESSION: Fluoroscopic assistance was provided for right hip arthroplasty. Electronically Signed   By: Elmer Picker M.D.   On: 07/04/2022 11:59   DG C-Arm 1-60 Min-No Report  Result Date: 07/04/2022 Fluoroscopy was utilized by the requesting physician.  No radiographic interpretation.   ECHOCARDIOGRAM COMPLETE  Result Date: 07/04/2022    ECHOCARDIOGRAM REPORT   Patient Name:  Nell Range Date of Exam: 07/04/2022 Medical Rec #:  [id number]       Height:       66.0 in Accession #:    [id number]      Weight:       119.7 lb Date of Birth:  06/30/1930       BSA:          1.608 m Patient Age:    40 years        BP:           126/77 mmHg Patient Gender: F               HR:           46 bpm. Exam Location:  Inpatient Procedure: 2D Echo, Cardiac Doppler, Color Doppler and Intracardiac            Opacification Agent STAT ECHO Indications:     Abnormal ECG R94.31  History:         Patient has prior history of Echocardiogram examinations, most                  recent 10/04/2016. CAD and Previous Myocardial Infarction;                  Arrythmias:Bradycardia. Fractured hip.  Sonographer:     Darlina Sicilian RDCS Referring Phys:   [place] Diagnosing Phys: Kirk Ruths McleanMD IMPRESSIONS  1. Left ventricular ejection fraction, by estimation, is 50%. The left ventricle has mildly decreased function. The left ventricle demonstrates global hypokinesis. There is mild concentric left ventricular hypertrophy. Left ventricular diastolic parameters are consistent with Grade I diastolic dysfunction (impaired relaxation).  2. Right ventricular systolic function is normal. The right ventricular size is normal. There is moderately elevated pulmonary artery systolic pressure. The estimated right ventricular systolic pressure is 09.2 mmHg.  3. Left atrial size was moderately dilated.  4. Right atrial size was mildly dilated.  5. The mitral valve is normal in structure. Mild mitral valve regurgitation. No evidence of mitral stenosis.  6. The aortic valve is tricuspid. There is mild calcification of the aortic valve. Aortic valve regurgitation is moderate.  7. Aortic dilatation noted. There is severe dilatation of the ascending aorta, measuring 52 mm.  8. The inferior vena cava is dilated in size with <50% respiratory variability, suggesting right atrial pressure of 15 mmHg.  9. A small pericardial effusion is present. FINDINGS  Left Ventricle: Left ventricular ejection fraction, by estimation, is 50%. The left ventricle has mildly decreased function. The left ventricle demonstrates global hypokinesis. Definity contrast agent was given IV to delineate the left ventricular endocardial borders. The left ventricular internal cavity size was normal in size. There is mild concentric left ventricular hypertrophy. Left ventricular diastolic parameters are consistent with Grade I diastolic dysfunction (impaired relaxation). Right Ventricle: The right ventricular size is normal. No increase in right ventricular wall thickness. Right ventricular systolic function is normal. There is moderately elevated pulmonary artery systolic pressure. The tricuspid  regurgitant velocity is 2.99 m/s, and with an assumed right atrial pressure of 15 mmHg, the estimated right ventricular systolic pressure is 33.0 mmHg. Left Atrium: Left atrial size was moderately dilated. Right Atrium: Right atrial size was mildly dilated. Pericardium: A small pericardial effusion is present. Mitral Valve: The mitral valve is normal in structure. Mild mitral valve regurgitation. No evidence of mitral valve stenosis. Tricuspid Valve: The tricuspid valve is normal in structure. Tricuspid valve regurgitation is mild.  Aortic Valve: The aortic valve is tricuspid. There is mild calcification of the aortic valve. Aortic valve regurgitation is moderate. Aortic regurgitation PHT measures 842 msec. Pulmonic Valve: The pulmonic valve was normal in structure. Pulmonic valve regurgitation is trivial. Aorta: Aortic dilatation noted. There is severe dilatation of the ascending aorta, measuring 52 mm. Venous: The inferior vena cava is dilated in size with less than 50% respiratory variability, suggesting right atrial pressure of 15 mmHg. IAS/Shunts: No atrial level shunt detected by color flow Doppler.  LEFT VENTRICLE PLAX 2D LVIDd:         5.60 cm     Diastology LVIDs:         3.20 cm     LV e' medial:    3.23 cm/s LV PW:         1.00 cm     LV E/e' medial:  13.0 LV IVS:        0.90 cm     LV e' lateral:   3.38 cm/s LVOT diam:     2.00 cm     LV E/e' lateral: 12.4 LVOT Area:     3.14 cm  LV Volumes (MOD) LV vol d, MOD A2C: 85.5 ml LV vol d, MOD A4C: 91.5 ml LV vol s, MOD A2C: 48.3 ml LV vol s, MOD A4C: 48.2 ml LV SV MOD A2C:     37.2 ml LV SV MOD A4C:     91.5 ml LV SV MOD BP:      41.3 ml RIGHT VENTRICLE RV Basal diam:  4.70 cm RV Mid diam:    2.40 cm RV S prime:     15.20 cm/s TAPSE (M-mode): 2.0 cm LEFT ATRIUM             Index        RIGHT ATRIUM           Index LA diam:        3.20 cm 1.99 cm/m   RA Area:     18.80 cm LA Vol (A2C):   90.4 ml 56.22 ml/m  RA Volume:   48.00 ml  29.85 ml/m LA Vol (A4C):    39.3 ml 24.44 ml/m LA Biplane Vol: 59.8 ml 37.19 ml/m  AORTIC VALVE AI PHT:      842 msec  AORTA Ao Root diam: 3.10 cm Ao STJ diam:  3.1 cm Ao Asc diam:  4.90 cm MITRAL VALVE               TRICUSPID VALVE MV Area (PHT): 1.94 cm    TR Peak grad:   35.8 mmHg MV Decel Time: 391 msec    TR Vmax:        299.00 cm/s MV E velocity: 42.00 cm/s MV A velocity: 84.80 cm/s  SHUNTS MV E/A ratio:  0.50        Systemic Diam: 2.00 cm Dalton McleanMD Electronically signed by Franki Monte Signature Date/Time: 07/04/2022/10:12:09 AM    Final (Updated)         Scheduled Meds:  aspirin EC  325 mg Oral Q breakfast   atorvastatin  10 mg Oral QHS   carbidopa-levodopa  1 tablet Oral TID   docusate sodium  100 mg Oral BID   metoprolol succinate  50 mg Oral Daily    morphine injection  2 mg Intravenous Once   pantoprazole  20 mg Oral Daily   Continuous Infusions:          Aline August, MD [facility]  07/06/2022, 7:59 AM

## 2022-07-06 NOTE — Plan of Care (Signed)
  Problem: Education: Goal: Knowledge of General Education information will improve Description Including pain rating scale, medication(s)/side effects and non-pharmacologic comfort measures Outcome: Progressing   

## 2022-07-07 DIAGNOSIS — D649 Anemia, unspecified: Secondary | ICD-10-CM | POA: Diagnosis not present

## 2022-07-07 DIAGNOSIS — S72001A Fracture of unspecified part of neck of right femur, initial encounter for closed fracture: Secondary | ICD-10-CM | POA: Diagnosis not present

## 2022-07-07 DIAGNOSIS — N1832 Chronic kidney disease, stage 3b: Secondary | ICD-10-CM | POA: Diagnosis not present

## 2022-07-07 DIAGNOSIS — I10 Essential (primary) hypertension: Secondary | ICD-10-CM | POA: Diagnosis not present

## 2022-07-07 MED ORDER — POLYETHYLENE GLYCOL 3350 17 G PO PACK
17.0000 g | PACK | Freq: Every day | ORAL | Status: DC
  Administered 2022-07-07 – 2022-07-08 (×2): 17 g via ORAL
  Filled 2022-07-07 (×2): qty 1

## 2022-07-07 MED ORDER — BISACODYL 10 MG RE SUPP
10.0000 mg | Freq: Every day | RECTAL | Status: DC | PRN
  Administered 2022-07-07: 10 mg via RECTAL
  Filled 2022-07-07: qty 1

## 2022-07-07 NOTE — TOC Progression Note (Signed)
Transition of Care [facility]) - Progression Note    Patient Details  Name: Taylor Mendoza MRN: [id number] Date of Birth: Mar 16, 1930  [facility] CM/SW Contact  Ross Ludwig, [place] Phone Number: 07/07/2022, 5:15 PM  Clinical Narrative:     CSW spoke to HTA regarding insurance authorization.  Patient has been approved for [facility].  SNF authorization is [id number] and EMS authorization is [phone].  Patient can go to [facility] on Monday if she is medically ready for discharge per Friday CSW note.  Expected Discharge Plan: [place] Barriers to Discharge: Continued Medical Work up, Ship broker  Expected Discharge Plan and Services Expected Discharge Plan: [place] In-house Referral: Clinical Social Work   Post Acute Care Choice: [place] Living arrangements for the past 2 months: [place]                                       Social Determinants of Health (SDOH) Interventions    Readmission Risk Interventions    07/05/2022    3:18 PM  Readmission Risk Prevention Plan  Transportation Screening Complete  PCP or Specialist Appt within 5-7 Days Complete  Home Care Screening Complete  Medication Review (RN CM) Complete

## 2022-07-07 NOTE — Progress Notes (Signed)
Physical Therapy Treatment Patient Details Name: Taylor Mendoza MRN: [id number] DOB: May 02, 1930 Today's Date: 07/07/2022   History of Present Illness Pt s/p fall with R hip fx and now s/p hemi-arthroplasty by anterior direct approach.  Pt with hx of CAD and MI    PT Comments    Pt continues very cooperative but progressing slowly with mobility.  Pt continues to require significant assist for all basic mobility tasks and would benefit from follow up rehab at SNF level to maximize IND and safety.   Recommendations for follow up therapy are one component of a multi-disciplinary discharge planning process, led by the attending physician.  Recommendations may be updated based on patient status, additional functional criteria and insurance authorization.  Follow Up Recommendations  Skilled nursing-short term rehab (<3 hours/day) Can patient physically be transported by private vehicle: No   Assistance Recommended at Discharge Frequent or constant Supervision/Assistance  Patient can return home with the following A lot of help with walking and/or transfers;A lot of help with bathing/dressing/bathroom;Assistance with cooking/housework;Assist for transportation;Help with stairs or ramp for entrance   Equipment Recommendations  None recommended by PT    Recommendations for Other Services       Precautions / Restrictions Precautions Precautions: Fall Restrictions Weight Bearing Restrictions: No RLE Weight Bearing: Weight bearing as tolerated     Mobility  Bed Mobility               General bed mobility comments: Pt up in chair and requests back to same    Transfers Overall transfer level: Needs assistance Equipment used: Rolling walker (2 wheels) Transfers: Sit to/from Stand Sit to Stand: Min assist, Mod assist           General transfer comment: cues for LE management and use of UEs to self assist.  Physical assist to bring wt up and fwd and to balance in standing.     Ambulation/Gait Ambulation/Gait assistance: Min assist, Mod assist, +2 safety/equipment Gait Distance (Feet): 17 Feet Assistive device: Rolling walker (2 wheels) Gait Pattern/deviations: Step-to pattern, Decreased step length - right, Decreased step length - left, Shuffle, Trunk flexed, Antalgic Gait velocity: decr     General Gait Details: cues for sequence, posture and position from RW with physical assist for support/balance and RW management   Stairs             Wheelchair Mobility    Modified Rankin (Stroke Patients Only)       Balance Overall balance assessment: Needs assistance Sitting-balance support: Feet supported, No upper extremity supported Sitting balance-Leahy Scale: Fair     Standing balance support: Bilateral upper extremity supported Standing balance-Leahy Scale: Poor                              Cognition Arousal/Alertness: Awake/alert Behavior During Therapy: WFL for tasks assessed/performed Overall Cognitive Status: Within Functional Limits for tasks assessed                                          Exercises Total Joint Exercises Ankle Circles/Pumps: AROM, Both, 15 reps, Supine Heel Slides: AAROM, Right, 15 reps, Supine Hip ABduction/ADduction: AAROM, Right, 15 reps, Supine Long Arc Quad: AAROM, Right, 10 reps, Seated    General Comments        Pertinent Vitals/Pain Pain Assessment Pain Assessment: 0-10 Pain Score:  3  Pain Location: R hip Pain Descriptors / Indicators: Aching, Grimacing, Sore Pain Intervention(s): Limited activity within patient's tolerance, Monitored during session, Premedicated before session, Ice applied    Home Living                          Prior Function            PT Goals (current goals can now be found in the care plan section) Acute Rehab PT Goals Patient Stated Goal: Walk again PT Goal Formulation: With patient/family Time For Goal Achievement:  07/18/22 Potential to Achieve Goals: Good Progress towards PT goals: Progressing toward goals    Frequency    Min 3X/week      PT Plan Current plan remains appropriate    Co-evaluation              AM-PAC PT "6 Clicks" Mobility   Outcome Measure  Help needed turning from your back to your side while in a flat bed without using bedrails?: A Lot Help needed moving from lying on your back to sitting on the side of a flat bed without using bedrails?: A Lot Help needed moving to and from a bed to a chair (including a wheelchair)?: A Lot Help needed standing up from a chair using your arms (e.g., wheelchair or bedside chair)?: A Lot Help needed to walk in hospital room?: A Lot Help needed climbing 3-5 steps with a railing? : Total 6 Click Score: 11    End of Session Equipment Utilized During Treatment: Gait belt Activity Tolerance: Patient tolerated treatment well Patient left: in chair;with call bell/phone within reach;with chair alarm set Nurse Communication: Mobility status PT Visit Diagnosis: Unsteadiness on feet (R26.81);Difficulty in walking, not elsewhere classified (R26.2)     Time: 3664-4034 PT Time Calculation (min) (ACUTE ONLY): 31 min  Charges:  $Gait Training: 8-22 mins $Therapeutic Exercise: 8-22 mins                     [place] Pager [phone] Office [phone]    [facility] 07/07/2022, 4:47 PM

## 2022-07-07 NOTE — Plan of Care (Signed)
  Problem: Education: Goal: Knowledge of General Education information will improve Description Including pain rating scale, medication(s)/side effects and non-pharmacologic comfort measures Outcome: Progressing   

## 2022-07-07 NOTE — Progress Notes (Signed)
PROGRESS NOTE    Taylor Mendoza  MWN:[id number] DOB: [birth year] DOA: 07/03/2022 PCP: [name], MD   Brief Narrative:  88 y.o. female with medical history significant of HTN, CAD, HLD presented with a mechanical fall and was found acute displaced right femoral neck fracture.  Orthopedics was consulted.  She underwent surgical intervention on 07/04/2022 by orthopedics.  Assessment & Plan:   Acute displaced right femoral neck fracture after a mechanical fall -Status post right hip direct anterior hemiarthroplasty on 07/04/2022 by orthopedics. -Wound care/DVT prophylaxis/activity as per orthopedics recommendations.  Continue pain management.  Fall precautions. -PT recommended SNF placement.  TOC consulted.  ?  Junctional tachycardia -Preoperative EKG was suggestive of?  Junctional tachycardia.  Prior hospitalist discussed with cardiology on-call who recommended 2D echo.  2D echo showed EF of 50% with grade 1 dysfunction and moderate aortic regurgitation. -Currently heart rate controlled.  -This can be followed up as an outpatient with cardiology  CAD Hyperlipidemia Hypertension -No chest pain.  Stable.  Continue statin and metoprolol  Chronic kidney disease stage IIIb -Creatinine stable.  Monitor intermittently  Leukocytosis -Reactive.  Resolved  Hypokalemia -Resolved  Thrombocytopenia -Monitor intermittently.  No signs of bleeding.  Normocytic anemia -Possibly from blood loss from right hip fracture.  Hemoglobin currently stable.  Monitor intermittently.   DVT prophylaxis: Aspirin 325 mg daily as per Orthopedics  code Status: DNR Family Communication: Daughter at bedside on 07/04/2022.  None at bedside today. Disposition Plan: Status is: Inpatient Remains inpatient appropriate because: Of severity of illness.  Will need SNF placement.  Currently medically stable for discharge to SNF  Consultants: Orthopedics Procedures: right hip direct anterior hemiarthroplasty on  07/04/2022 by orthopedics  Antimicrobials: None   Subjective: Patient seen and examined at bedside.  Poor historian.  No fever, agitation or seizures reported. Objective: Vitals:   07/06/22 0942 07/06/22 1336 07/06/22 2238 07/07/22 0642  BP: (!) 141/61 (!) 158/58 136/84 (!) 153/82  Pulse: 81 67 62 63  Resp:  '18 18 18  '$ Temp:  98.9 F (37.2 C) 98.7 F (37.1 C) 99.3 F (37.4 C)  TempSrc:  Oral Oral Oral  SpO2:  94% 96% 97%  Weight:      Height:        Intake/Output Summary (Last 24 hours) at 07/07/2022 0739 Last data filed at 07/07/2022 0549 Gross per 24 hour  Intake 120 ml  Output 800 ml  Net -680 ml    Filed Weights   07/04/22 0207  Weight: 54.3 kg    Examination:  General: On room air currently.  No distress.  Elderly female lying in bed.  Sleepy, wakes up slightly, still slow to respond.   Respiratory: Bilateral decreased breath sounds at bases with some scattered crackles  CVS: S1 and S2 are heard.  Rate controlled currently.   Abdominal: Soft, nontender, mildly distended; no organomegaly; bowel sounds are heard normally extremities: No cyanosis; mild lower extremity edema present  Data Reviewed: I have personally reviewed following labs and imaging studies  CBC: Recent Labs  Lab 07/03/22 1552 07/03/22 2123 07/04/22 0340 07/05/22 0335 07/06/22 0403  WBC 11.2* 9.3 5.9 6.6 6.2  NEUTROABS 9.7*  --   --  4.7 4.6  HGB 13.3 12.4 11.5* 9.6* 9.3*  HCT 39.7 37.4 35.5* 29.5* 28.7*  MCV 87.3 89.0 90.6 89.4 89.7  PLT 210 197 178 145* 144*    Basic Metabolic Panel: Recent Labs  Lab 07/03/22 1552 07/03/22 2123 07/04/22 0340 07/05/22 0335 07/06/22 0403  NA  141  --  143 142 139  K 3.6  --  4.1 3.4* 4.3  CL 107  --  111 110 109  CO2 24  --  '23 24 24  '$ GLUCOSE 98  --  153* 96 98  BUN 55*  --  53* 52* 36*  CREATININE 1.72* 1.46* 1.58* 1.49* 1.29*  CALCIUM 9.2  --  8.8* 7.8* 8.2*  MG  --   --   --  1.9 1.8    GFR: Estimated Creatinine Clearance: 25.3 mL/min  (A) (by C-G formula based on SCr of 1.29 mg/dL (H)). Liver Function Tests: Recent Labs  Lab 07/04/22 0340  AST 18  ALT 6  ALKPHOS 74  BILITOT 0.7  PROT 6.1*  ALBUMIN 3.3*    No results for input(s): "LIPASE", "AMYLASE" in the last 168 hours. No results for input(s): "AMMONIA" in the last 168 hours. Coagulation Profile: Recent Labs  Lab 07/03/22 1552  INR 1.1    Cardiac Enzymes: Recent Labs  Lab 07/03/22 1552  CKTOTAL 118    BNP (last 3 results) No results for input(s): "PROBNP" in the last 8760 hours. HbA1C: No results for input(s): "HGBA1C" in the last 72 hours. CBG: Recent Labs  Lab 07/05/22 1738  GLUCAP 69*    Lipid Profile: No results for input(s): "CHOL", "HDL", "LDLCALC", "TRIG", "CHOLHDL", "LDLDIRECT" in the last 72 hours. Thyroid Function Tests: No results for input(s): "TSH", "T4TOTAL", "FREET4", "T3FREE", "THYROIDAB" in the last 72 hours. Anemia Panel: No results for input(s): "VITAMINB12", "FOLATE", "FERRITIN", "TIBC", "IRON", "RETICCTPCT" in the last 72 hours. Sepsis Labs: No results for input(s): "PROCALCITON", "LATICACIDVEN" in the last 168 hours.  Recent Results (from the past 240 hour(s))  Surgical PCR screen     Status: None   Collection Time: 07/04/22  1:03 AM   Specimen: Nasal Mucosa; Nasal Swab  Result Value Ref Range Status   MRSA, PCR NEGATIVE NEGATIVE Final   Staphylococcus aureus NEGATIVE NEGATIVE Final    Comment: (NOTE) The Xpert SA Assay (FDA approved for NASAL specimens in patients 50 years of age and older), is one component of a comprehensive surveillance program. It is not intended to diagnose infection nor to guide or monitor treatment. Performed at [facility], [place] [address] [place], [place] [address]          Radiology Studies: No results found.      Scheduled Meds:  aspirin EC  325 mg Oral Q breakfast   atorvastatin  10 mg Oral QHS   carbidopa-levodopa  1 tablet Oral TID    docusate sodium  100 mg Oral BID   metoprolol succinate  50 mg Oral Daily    morphine injection  2 mg Intravenous Once   pantoprazole  20 mg Oral Daily   Continuous Infusions:          Aline August, MD [facility] 07/07/2022, 7:39 AM

## 2022-07-08 ENCOUNTER — Encounter (HOSPITAL_COMMUNITY): Payer: Self-pay | Admitting: Orthopaedic Surgery

## 2022-07-08 DIAGNOSIS — S72001A Fracture of unspecified part of neck of right femur, initial encounter for closed fracture: Secondary | ICD-10-CM | POA: Diagnosis not present

## 2022-07-08 DIAGNOSIS — I10 Essential (primary) hypertension: Secondary | ICD-10-CM | POA: Diagnosis not present

## 2022-07-08 DIAGNOSIS — N1832 Chronic kidney disease, stage 3b: Secondary | ICD-10-CM | POA: Diagnosis not present

## 2022-07-08 MED ORDER — DOCUSATE SODIUM 100 MG PO CAPS: 100.0000 mg | ORAL_CAPSULE | Freq: Two times a day (BID) | ORAL | 0 refills | Status: DC

## 2022-07-08 MED ORDER — POLYETHYLENE GLYCOL 3350 17 G PO PACK: 17.0000 g | PACK | Freq: Every day | ORAL | 0 refills | Status: DC

## 2022-07-08 NOTE — Discharge Summary (Signed)
Physician Discharge Summary  Taylor Mendoza WEX:[id number] DOB: 04-04-1930 DOA: 07/03/2022  PCP: [name], MD  Admit date: 07/03/2022 Discharge date: 07/08/2022  Admitted From: Independent living facility  disposition: SNF  Recommendations for Outpatient Follow-up:  Follow up with SNF provider at earliest convenience Outpatient follow-up with orthopedics.  Discharge pain management/DVT prophylaxis/wound care/activity as per orthopedics recommendations  follow up in ED if symptoms worsen or new appear   Home Health: No Equipment/Devices: None  Discharge Condition: Stable CODE STATUS: DNR Diet recommendation: Heart healthy  Brief/Interim Summary: 88 y.o. female with medical history significant of HTN, CAD, HLD presented with a mechanical fall and was found acute displaced right femoral neck fracture.  Orthopedics was consulted.  She underwent surgical intervention on 07/04/2022 by orthopedics.  Subsequently, PT recommended SNF placement.  She is currently medically stable for discharge.  Discharge to SNF once bed is available.  Discharge Diagnoses:   Acute displaced right femoral neck fracture after a mechanical fall -Status post right hip direct anterior hemiarthroplasty on 07/04/2022 by orthopedics. -Wound care/DVT prophylaxis/activity as per orthopedics recommendations.  Continue pain management.  Fall precautions. -PT recommended SNF placement.   -She is currently medically stable for discharge.  Discharge to SNF once bed is available.   ?  Junctional tachycardia -Preoperative EKG was suggestive of?  Junctional tachycardia.  Prior hospitalist discussed with cardiology on-call who recommended 2D echo.  2D echo showed EF of 50% with grade 1 dysfunction and moderate aortic regurgitation. -Currently heart rate controlled.  -This can be followed up as an outpatient with cardiology   CAD Hyperlipidemia Hypertension -No chest pain.  Stable.  Continue statin and  metoprolol  Chronic kidney disease stage IIIb -Creatinine stable.  Monitor intermittently   Leukocytosis -Reactive.  Resolved   Hypokalemia -Resolved   Thrombocytopenia -Monitor intermittently.  No signs of bleeding.   Normocytic anemia -Possibly from blood loss from right hip fracture.  Hemoglobin currently stable.  Monitor intermittently.  Discharge Instructions   Allergies as of 07/08/2022       Reactions   Amlodipine Anaphylaxis, Swelling, Other (See Comments)   Edema   Alendronate Other (See Comments)   Dysphagia   Ramipril Cough        Medication List     STOP taking these medications    acetaminophen 500 MG tablet Commonly known as: TYLENOL       TAKE these medications    aspirin EC 325 MG tablet Take 1 tablet (325 mg total) by mouth daily with breakfast.   atorvastatin 10 MG tablet Commonly known as: LIPITOR Take 10 mg by mouth at bedtime.   carbidopa-levodopa 25-100 MG tablet Commonly known as: SINEMET IR Take 1 tablet by mouth 3 (three) times daily.   docusate sodium 100 MG capsule Commonly known as: COLACE Take 1 capsule (100 mg total) by mouth 2 (two) times daily.   furosemide 20 MG tablet Commonly known as: LASIX Take 20 mg by mouth daily as needed for fluid.   HYDROcodone-acetaminophen 5-325 MG tablet Commonly known as: NORCO/VICODIN Take 1 tablet by mouth every 6 (six) hours as needed for moderate pain.   metoprolol succinate 50 MG 24 hr tablet Commonly known as: TOPROL-XL Take 50 mg by mouth daily.   nitroGLYCERIN 0.4 MG SL tablet Commonly known as: NITROSTAT Place 1 tablet (0.4 mg total) under the tongue every 5 (five) minutes as needed for chest pain.   pantoprazole 20 MG tablet Commonly known as: PROTONIX Take 20 mg by mouth daily.  polyethylene glycol 17 g packet Commonly known as: MIRALAX / GLYCOLAX Take 17 g by mouth daily.               Discharge Care Instructions  (From admission, onward)            Start     Ordered   07/08/22 0000  Discharge wound care:       Comments: As per orthopedics recommendations   07/08/22 0841             Contact information for follow-up providers     Mcarthur Rossetti, MD. Schedule an appointment as soon as possible for a visit in 2 week(s).   Specialty: Orthopedic Surgery Contact information: [place] [address] [phone]         [name], MD. Schedule an appointment as soon as possible for a visit in 1 week(s).   Specialty: Internal Medicine Contact information: [address] [place] [address] [phone]         Sherren Mocha, MD .   Specialty: Cardiology Contact information: [phone] [address] [phone]              Contact information for after-discharge care     Destination     [facility] .   Service: Skilled Nursing Contact information: [place] [address] [phone]                    Allergies  Allergen Reactions   Amlodipine Anaphylaxis, Swelling and Other (See Comments)    Edema   Alendronate Other (See Comments)    Dysphagia   Ramipril Cough    Consultations: Orthopedics   Procedures/Studies: Pelvis Portable  Result Date: 07/04/2022 CLINICAL DATA:  Postoperative hip replacement EXAM: PORTABLE PELVIS 1-2 VIEWS COMPARISON:  Pelvis and right hip radiographs 07/03/2022 FINDINGS: Interval total right hip arthroplasty. No perihardware lucency is seen to indicate hardware failure or loosening. Expected postoperative changes including subcutaneous air and soft tissue swelling about the right hip. Lateral right hip surgical skin staples. Mild pubic symphysis osteoarthritis. No acute fracture or dislocation. IMPRESSION: Interval total right hip arthroplasty without evidence of hardware failure. Electronically Signed   By: Yvonne Kendall M.D.    On: 07/04/2022 12:43   DG HIP UNILAT WITH PELVIS 1V RIGHT  Result Date: 07/04/2022 CLINICAL DATA:  Fluoroscopic assistance for right hip arthroplasty EXAM: DG HIP (WITH OR WITHOUT PELVIS) 1V RIGHT COMPARISON:  None Available. FINDINGS: Fluoroscopic images show right hip arthroplasty. Fluoroscopy time 7 seconds. Radiation dose 0.8 mGy. IMPRESSION: Fluoroscopic assistance was provided for right hip arthroplasty. Electronically Signed   By: Elmer Picker M.D.   On: 07/04/2022 11:59   DG C-Arm 1-60 Min-No Report  Result Date: 07/04/2022 Fluoroscopy was utilized by the requesting physician.  No radiographic interpretation.   ECHOCARDIOGRAM COMPLETE  Result Date: 07/04/2022    ECHOCARDIOGRAM REPORT   Patient Name:   JIMMIE DATTILIO Date of Exam: 07/04/2022 Medical Rec #:  [id number]       Height:       66.0 in Accession #:    [id number]      Weight:       119.7 lb Date of Birth:  1930-07-15       BSA:          1.608 m Patient Age:    46 years  BP:           126/77 mmHg Patient Gender: F               HR:           46 bpm. Exam Location:  Inpatient Procedure: 2D Echo, Cardiac Doppler, Color Doppler and Intracardiac            Opacification Agent STAT ECHO Indications:     Abnormal ECG R94.31  History:         Patient has prior history of Echocardiogram examinations, most                  recent 10/04/2016. CAD and Previous Myocardial Infarction;                  Arrythmias:Bradycardia. Fractured hip.  Sonographer:     Darlina Sicilian RDCS Referring Phys:  [place] Diagnosing Phys: Kirk Ruths McleanMD IMPRESSIONS  1. Left ventricular ejection fraction, by estimation, is 50%. The left ventricle has mildly decreased function. The left ventricle demonstrates global hypokinesis. There is mild concentric left ventricular hypertrophy. Left ventricular diastolic parameters are consistent with Grade I diastolic dysfunction (impaired relaxation).  2. Right ventricular systolic function is normal. The  right ventricular size is normal. There is moderately elevated pulmonary artery systolic pressure. The estimated right ventricular systolic pressure is 94.7 mmHg.  3. Left atrial size was moderately dilated.  4. Right atrial size was mildly dilated.  5. The mitral valve is normal in structure. Mild mitral valve regurgitation. No evidence of mitral stenosis.  6. The aortic valve is tricuspid. There is mild calcification of the aortic valve. Aortic valve regurgitation is moderate.  7. Aortic dilatation noted. There is severe dilatation of the ascending aorta, measuring 52 mm.  8. The inferior vena cava is dilated in size with <50% respiratory variability, suggesting right atrial pressure of 15 mmHg.  9. A small pericardial effusion is present. FINDINGS  Left Ventricle: Left ventricular ejection fraction, by estimation, is 50%. The left ventricle has mildly decreased function. The left ventricle demonstrates global hypokinesis. Definity contrast agent was given IV to delineate the left ventricular endocardial borders. The left ventricular internal cavity size was normal in size. There is mild concentric left ventricular hypertrophy. Left ventricular diastolic parameters are consistent with Grade I diastolic dysfunction (impaired relaxation). Right Ventricle: The right ventricular size is normal. No increase in right ventricular wall thickness. Right ventricular systolic function is normal. There is moderately elevated pulmonary artery systolic pressure. The tricuspid regurgitant velocity is 2.99 m/s, and with an assumed right atrial pressure of 15 mmHg, the estimated right ventricular systolic pressure is 09.6 mmHg. Left Atrium: Left atrial size was moderately dilated. Right Atrium: Right atrial size was mildly dilated. Pericardium: A small pericardial effusion is present. Mitral Valve: The mitral valve is normal in structure. Mild mitral valve regurgitation. No evidence of mitral valve stenosis. Tricuspid Valve: The  tricuspid valve is normal in structure. Tricuspid valve regurgitation is mild. Aortic Valve: The aortic valve is tricuspid. There is mild calcification of the aortic valve. Aortic valve regurgitation is moderate. Aortic regurgitation PHT measures 842 msec. Pulmonic Valve: The pulmonic valve was normal in structure. Pulmonic valve regurgitation is trivial. Aorta: Aortic dilatation noted. There is severe dilatation of the ascending aorta, measuring 52 mm. Venous: The inferior vena cava is dilated in size with less than 50% respiratory variability, suggesting right atrial pressure of 15 mmHg. IAS/Shunts: No atrial level shunt detected by  color flow Doppler.  LEFT VENTRICLE PLAX 2D LVIDd:         5.60 cm     Diastology LVIDs:         3.20 cm     LV e' medial:    3.23 cm/s LV PW:         1.00 cm     LV E/e' medial:  13.0 LV IVS:        0.90 cm     LV e' lateral:   3.38 cm/s LVOT diam:     2.00 cm     LV E/e' lateral: 12.4 LVOT Area:     3.14 cm  LV Volumes (MOD) LV vol d, MOD A2C: 85.5 ml LV vol d, MOD A4C: 91.5 ml LV vol s, MOD A2C: 48.3 ml LV vol s, MOD A4C: 48.2 ml LV SV MOD A2C:     37.2 ml LV SV MOD A4C:     91.5 ml LV SV MOD BP:      41.3 ml RIGHT VENTRICLE RV Basal diam:  4.70 cm RV Mid diam:    2.40 cm RV S prime:     15.20 cm/s TAPSE (M-mode): 2.0 cm LEFT ATRIUM             Index        RIGHT ATRIUM           Index LA diam:        3.20 cm 1.99 cm/m   RA Area:     18.80 cm LA Vol (A2C):   90.4 ml 56.22 ml/m  RA Volume:   48.00 ml  29.85 ml/m LA Vol (A4C):   39.3 ml 24.44 ml/m LA Biplane Vol: 59.8 ml 37.19 ml/m  AORTIC VALVE AI PHT:      842 msec  AORTA Ao Root diam: 3.10 cm Ao STJ diam:  3.1 cm Ao Asc diam:  4.90 cm MITRAL VALVE               TRICUSPID VALVE MV Area (PHT): 1.94 cm    TR Peak grad:   35.8 mmHg MV Decel Time: 391 msec    TR Vmax:        299.00 cm/s MV E velocity: 42.00 cm/s MV A velocity: 84.80 cm/s  SHUNTS MV E/A ratio:  0.50        Systemic Diam: 2.00 cm Dalton McleanMD Electronically  signed by Franki Monte Signature Date/Time: 07/04/2022/10:12:09 AM    Final (Updated)    DG Knee Complete 4 Views Right  Result Date: 07/03/2022 CLINICAL DATA:  Right hip fracture EXAM: RIGHT KNEE - COMPLETE 4+ VIEW COMPARISON:  None FINDINGS: Anterior soft tissue swelling. Osteoarthritis most pronounced in the lateral compartment. No evidence of fracture. Question old healed fracture of the proximal fibula. IMPRESSION: No acute fracture. Mild soft tissue swelling. Osteoarthritis most pronounced in the lateral compartment. Question old healed fracture of the proximal fibula. Electronically Signed   By: Nelson Chimes M.D.   On: 07/03/2022 17:16   DG Chest 1 View  Result Date: 07/03/2022 CLINICAL DATA:  Hip fracture EXAM: CHEST  1 VIEW COMPARISON:  12/24/2015 FINDINGS: Chronic cardiomegaly and aortic tortuosity. The lungs are clear. The vascularity is normal. No effusions. No visible rib fracture. IMPRESSION: Cardiomegaly and aortic atherosclerosis.  No active disease. Electronically Signed   By: Nelson Chimes M.D.   On: 07/03/2022 17:14   DG Hip Unilat With Pelvis 2-3 Views Right  Result Date: 07/03/2022 CLINICAL DATA:  Fall with pain EXAM: DG HIP (WITH OR WITHOUT PELVIS) 2-3V RIGHT COMPARISON:  None FINDINGS: Femoral neck fracture with mild displacement. No intertrochanteric component. Right hemipelvis is negative. IMPRESSION: Displaced femoral neck fracture on the right. Electronically Signed   By: Nelson Chimes M.D.   On: 07/03/2022 17:14      Subjective: Patient seen and examined at bedside.  No fever, agitation, seizures or vomiting reported.  Discharge Exam: Vitals:   07/07/22 2104 07/08/22 0516  BP: 140/63 121/70  Pulse: (!) 55 78  Resp: 18 18  Temp: 98.1 F (36.7 C) 98.4 F (36.9 C)  SpO2: 100% 95%    General: Alert, slow to respond, poor historian.  Currently on room air.  Elderly female lying in bed.   Cardiovascular: rate controlled currently, S1/S2 + Respiratory: bilateral  decreased breath sounds at bases Abdominal: Soft, NT, ND, bowel sounds + Extremities: Trace lower extremity edema; no cyanosis    The results of significant diagnostics from this hospitalization (including imaging, microbiology, ancillary and laboratory) are listed below for reference.     Microbiology: Recent Results (from the past 240 hour(s))  Surgical PCR screen     Status: None   Collection Time: 07/04/22  1:03 AM   Specimen: Nasal Mucosa; Nasal Swab  Result Value Ref Range Status   MRSA, PCR NEGATIVE NEGATIVE Final   Staphylococcus aureus NEGATIVE NEGATIVE Final    Comment: (NOTE) The Xpert SA Assay (FDA approved for NASAL specimens in patients 87 years of age and older), is one component of a comprehensive surveillance program. It is not intended to diagnose infection nor to guide or monitor treatment. Performed at [facility], [place] [address], [place], [place] [address]      Labs: BNP (last 3 results) No results for input(s): "BNP" in the last 8760 hours. Basic Metabolic Panel: Recent Labs  Lab 07/03/22 1552 07/03/22 2123 07/04/22 0340 07/05/22 0335 07/06/22 0403  NA 141  --  143 142 139  K 3.6  --  4.1 3.4* 4.3  CL 107  --  111 110 109  CO2 24  --  '23 24 24  '$ GLUCOSE 98  --  153* 96 98  BUN 55*  --  53* 52* 36*  CREATININE 1.72* 1.46* 1.58* 1.49* 1.29*  CALCIUM 9.2  --  8.8* 7.8* 8.2*  MG  --   --   --  1.9 1.8   Liver Function Tests: Recent Labs  Lab 07/04/22 0340  AST 18  ALT 6  ALKPHOS 74  BILITOT 0.7  PROT 6.1*  ALBUMIN 3.3*   No results for input(s): "LIPASE", "AMYLASE" in the last 168 hours. No results for input(s): "AMMONIA" in the last 168 hours. CBC: Recent Labs  Lab 07/03/22 1552 07/03/22 2123 07/04/22 0340 07/05/22 0335 07/06/22 0403  WBC 11.2* 9.3 5.9 6.6 6.2  NEUTROABS 9.7*  --   --  4.7 4.6  HGB 13.3 12.4 11.5* 9.6* 9.3*  HCT 39.7 37.4 35.5* 29.5* 28.7*  MCV 87.3 89.0 90.6 89.4 89.7  PLT 210 197 178  145* 144*   Cardiac Enzymes: Recent Labs  Lab 07/03/22 1552  CKTOTAL 118   BNP: Invalid input(s): "POCBNP" CBG: Recent Labs  Lab 07/05/22 1738  GLUCAP 69*   D-Dimer No results for input(s): "DDIMER" in the last 72 hours. Hgb A1c No results for input(s): "HGBA1C" in the last 72 hours. Lipid Profile No results for input(s): "CHOL", "HDL", "LDLCALC", "TRIG", "CHOLHDL", "LDLDIRECT" in the last 72 hours. Thyroid  function studies No results for input(s): "TSH", "T4TOTAL", "T3FREE", "THYROIDAB" in the last 72 hours.  Invalid input(s): "FREET3" Anemia work up No results for input(s): "VITAMINB12", "FOLATE", "FERRITIN", "TIBC", "IRON", "RETICCTPCT" in the last 72 hours. Urinalysis    Component Value Date/Time   COLORURINE YELLOW 07/04/2022 1314   APPEARANCEUR HAZY (A) 07/04/2022 1314   LABSPEC 1.019 07/04/2022 1314   PHURINE 5.0 07/04/2022 1314   GLUCOSEU NEGATIVE 07/04/2022 1314   HGBUR NEGATIVE 07/04/2022 1314   BILIRUBINUR NEGATIVE 07/04/2022 1314   KETONESUR NEGATIVE 07/04/2022 1314   PROTEINUR NEGATIVE 07/04/2022 1314   UROBILINOGEN 0.2 03/14/2009 0930   NITRITE NEGATIVE 07/04/2022 1314   LEUKOCYTESUR NEGATIVE 07/04/2022 1314   Sepsis Labs Recent Labs  Lab 07/03/22 2123 07/04/22 0340 07/05/22 0335 07/06/22 0403  WBC 9.3 5.9 6.6 6.2   Microbiology Recent Results (from the past 240 hour(s))  Surgical PCR screen     Status: None   Collection Time: 07/04/22  1:03 AM   Specimen: Nasal Mucosa; Nasal Swab  Result Value Ref Range Status   MRSA, PCR NEGATIVE NEGATIVE Final   Staphylococcus aureus NEGATIVE NEGATIVE Final    Comment: (NOTE) The Xpert SA Assay (FDA approved for NASAL specimens in patients 85 years of age and older), is one component of a comprehensive surveillance program. It is not intended to diagnose infection nor to guide or monitor treatment. Performed at [facility], [place] [address], [place], [place] [address]      Time  coordinating discharge: 35 minutes  SIGNED:   Aline August, MD  [facility] 07/08/2022, 7:55 AM

## 2022-07-08 NOTE — Care Management Important Message (Signed)
Important Message  Patient Details IM Letter given to the Patient. Name: Taylor Mendoza MRN: [id number] Date of Birth: 02-Mar-1930   Medicare Important Message Given:  Yes     Kerin Salen 07/08/2022, 10:11 AM

## 2022-07-08 NOTE — Plan of Care (Signed)
  Problem: Health Behavior/Discharge Planning: Goal: Ability to manage health-related needs will improve Outcome: Adequate for Discharge   Problem: Clinical Measurements: Goal: Ability to maintain clinical measurements within normal limits will improve Outcome: Adequate for Discharge Goal: Will remain free from infection Outcome: Adequate for Discharge Goal: Diagnostic test results will improve Outcome: Adequate for Discharge Goal: Respiratory complications will improve Outcome: Adequate for Discharge Goal: Cardiovascular complication will be avoided Outcome: Adequate for Discharge   Problem: Activity: Goal: Risk for activity intolerance will decrease Outcome: Adequate for Discharge   Problem: Nutrition: Goal: Adequate nutrition will be maintained Outcome: Adequate for Discharge   Problem: Coping: Goal: Level of anxiety will decrease Outcome: Adequate for Discharge   Problem: Elimination: Goal: Will not experience complications related to bowel motility Outcome: Adequate for Discharge Goal: Will not experience complications related to urinary retention Outcome: Adequate for Discharge   Problem: Pain Managment: Goal: General experience of comfort will improve Outcome: Adequate for Discharge   Problem: Safety: Goal: Ability to remain free from injury will improve Outcome: Adequate for Discharge   Problem: Skin Integrity: Goal: Risk for impaired skin integrity will decrease Outcome: Adequate for Discharge   Problem: Education: Goal: Knowledge of the prescribed therapeutic regimen will improve Outcome: Adequate for Discharge Goal: Understanding of discharge needs will improve Outcome: Adequate for Discharge   Problem: Activity: Goal: Ability to avoid complications of mobility impairment will improve Outcome: Adequate for Discharge Goal: Ability to tolerate increased activity will improve Outcome: Adequate for Discharge   Problem: Clinical Measurements: Goal:  Postoperative complications will be avoided or minimized Outcome: Adequate for Discharge   Problem: Pain Management: Goal: Pain level will decrease with appropriate interventions Outcome: Adequate for Discharge   Problem: Skin Integrity: Goal: Will show signs of wound healing Outcome: Adequate for Discharge   Problem: Acute Rehab PT Goals(only PT should resolve) Goal: Pt Will Go Supine/Side To Sit Outcome: Adequate for Discharge Goal: Pt Will Go Sit To Supine/Side Outcome: Adequate for Discharge Goal: Patient Will Transfer Sit To/From Stand Outcome: Adequate for Discharge Goal: Pt Will Ambulate Outcome: Adequate for Discharge   Problem: Education: Goal: Knowledge of General Education information will improve Description: Including pain rating scale, medication(s)/side effects and non-pharmacologic comfort measures Outcome: Adequate for Discharge   Problem: Health Behavior/Discharge Planning: Goal: Ability to manage health-related needs will improve Outcome: Adequate for Discharge   Problem: Clinical Measurements: Goal: Ability to maintain clinical measurements within normal limits will improve Outcome: Adequate for Discharge Goal: Will remain free from infection Outcome: Adequate for Discharge Goal: Diagnostic test results will improve Outcome: Adequate for Discharge Goal: Respiratory complications will improve Outcome: Adequate for Discharge Goal: Cardiovascular complication will be avoided Outcome: Adequate for Discharge   Problem: Activity: Goal: Risk for activity intolerance will decrease Outcome: Adequate for Discharge   Problem: Nutrition: Goal: Adequate nutrition will be maintained Outcome: Adequate for Discharge   Problem: Coping: Goal: Level of anxiety will decrease Outcome: Adequate for Discharge   Problem: Elimination: Goal: Will not experience complications related to bowel motility Outcome: Adequate for Discharge Goal: Will not experience  complications related to urinary retention Outcome: Adequate for Discharge   Problem: Pain Managment: Goal: General experience of comfort will improve Outcome: Adequate for Discharge   Problem: Safety: Goal: Ability to remain free from injury will improve Outcome: Adequate for Discharge   Problem: Skin Integrity: Goal: Risk for impaired skin integrity will decrease Outcome: Adequate for Discharge

## 2022-07-08 NOTE — TOC Transition Note (Signed)
Transition of Care [facility]) - CM/SW Discharge Note   Patient Details  Name: Taylor Mendoza MRN: [id number] Date of Birth: Feb 10, 1930  Transition of Care [facility]) CM/SW Contact:  Lennart Pall, LCSW Phone Number: 07/08/2022, 11:49 AM   Clinical Narrative:    Have received insurance authorization for SNF (# [id number]) and PTAR  [phone]) and pt medically cleared for dc today to [facility].  Pt and daughter aware/ agreeable.  PTAR called at 11:50am.  RN to call report to [phone].  No further TOC needs.   Final next level of care: Skilled Nursing Facility Barriers to Discharge: Barriers Resolved   Patient Goals and CMS Choice Patient states their goals for this hospitalization and ongoing recovery are:: return to [facility]      Discharge Placement PASRR number recieved: 07/05/22            Patient chooses bed at: [facility] Patient to be transferred to facility by: [facility] Name of family member notified: daughter, Felipa Eth Patient and family notified of of transfer: 07/08/22  Discharge Plan and Services In-house Referral: Clinical Social Work   Post Acute Care Choice: [facility]          DME Arranged: N/A DME Agency: NA                  Social Determinants of Health ([place]) Interventions     Readmission Risk Interventions    07/05/2022    3:18 PM  Readmission Risk Prevention Plan  Transportation Screening Complete  PCP or Specialist Appt within 5-7 Days Complete  Home Care Screening Complete  Medication Review (RN CM) Complete

## 2022-07-08 NOTE — Progress Notes (Signed)
Pt had hard lump stool. Bright red bleeding noted per rectum from her hemorrhoids.

## 2022-07-09 ENCOUNTER — Encounter: Payer: Self-pay | Admitting: Internal Medicine

## 2022-07-09 ENCOUNTER — Non-Acute Institutional Stay (SKILLED_NURSING_FACILITY): Payer: PPO | Admitting: Internal Medicine

## 2022-07-09 DIAGNOSIS — E785 Hyperlipidemia, unspecified: Secondary | ICD-10-CM | POA: Diagnosis not present

## 2022-07-09 DIAGNOSIS — Z79899 Other long term (current) drug therapy: Secondary | ICD-10-CM | POA: Diagnosis not present

## 2022-07-09 DIAGNOSIS — R609 Edema, unspecified: Secondary | ICD-10-CM | POA: Diagnosis not present

## 2022-07-09 DIAGNOSIS — Z7901 Long term (current) use of anticoagulants: Secondary | ICD-10-CM | POA: Diagnosis not present

## 2022-07-09 DIAGNOSIS — Z96641 Presence of right artificial hip joint: Secondary | ICD-10-CM | POA: Diagnosis not present

## 2022-07-09 DIAGNOSIS — K219 Gastro-esophageal reflux disease without esophagitis: Secondary | ICD-10-CM

## 2022-07-09 DIAGNOSIS — I8011 Phlebitis and thrombophlebitis of right femoral vein: Secondary | ICD-10-CM | POA: Diagnosis not present

## 2022-07-09 DIAGNOSIS — S72001A Fracture of unspecified part of neck of right femur, initial encounter for closed fracture: Secondary | ICD-10-CM | POA: Diagnosis not present

## 2022-07-09 DIAGNOSIS — R000 Tachycardia, unspecified: Secondary | ICD-10-CM | POA: Diagnosis not present

## 2022-07-09 DIAGNOSIS — R0600 Dyspnea, unspecified: Secondary | ICD-10-CM | POA: Diagnosis not present

## 2022-07-09 DIAGNOSIS — I252 Old myocardial infarction: Secondary | ICD-10-CM | POA: Diagnosis not present

## 2022-07-09 DIAGNOSIS — N1832 Chronic kidney disease, stage 3b: Secondary | ICD-10-CM

## 2022-07-09 DIAGNOSIS — Z86718 Personal history of other venous thrombosis and embolism: Secondary | ICD-10-CM | POA: Diagnosis not present

## 2022-07-09 DIAGNOSIS — R110 Nausea: Secondary | ICD-10-CM | POA: Diagnosis not present

## 2022-07-09 DIAGNOSIS — G20 Parkinson's disease: Secondary | ICD-10-CM | POA: Diagnosis not present

## 2022-07-09 DIAGNOSIS — D649 Anemia, unspecified: Secondary | ICD-10-CM | POA: Diagnosis not present

## 2022-07-09 DIAGNOSIS — K5901 Slow transit constipation: Secondary | ICD-10-CM | POA: Diagnosis not present

## 2022-07-09 DIAGNOSIS — I10 Essential (primary) hypertension: Secondary | ICD-10-CM

## 2022-07-09 DIAGNOSIS — J90 Pleural effusion, not elsewhere classified: Secondary | ICD-10-CM | POA: Diagnosis not present

## 2022-07-09 DIAGNOSIS — R0602 Shortness of breath: Secondary | ICD-10-CM | POA: Diagnosis not present

## 2022-07-09 DIAGNOSIS — I2693 Single subsegmental pulmonary embolism without acute cor pulmonale: Secondary | ICD-10-CM | POA: Diagnosis not present

## 2022-07-09 DIAGNOSIS — I491 Atrial premature depolarization: Secondary | ICD-10-CM | POA: Diagnosis not present

## 2022-07-09 DIAGNOSIS — F5101 Primary insomnia: Secondary | ICD-10-CM | POA: Diagnosis not present

## 2022-07-09 DIAGNOSIS — D62 Acute posthemorrhagic anemia: Secondary | ICD-10-CM | POA: Diagnosis not present

## 2022-07-09 DIAGNOSIS — M7989 Other specified soft tissue disorders: Secondary | ICD-10-CM | POA: Diagnosis not present

## 2022-07-09 DIAGNOSIS — I4891 Unspecified atrial fibrillation: Secondary | ICD-10-CM | POA: Diagnosis not present

## 2022-07-09 DIAGNOSIS — R0902 Hypoxemia: Secondary | ICD-10-CM | POA: Diagnosis not present

## 2022-07-09 DIAGNOSIS — I129 Hypertensive chronic kidney disease with stage 1 through stage 4 chronic kidney disease, or unspecified chronic kidney disease: Secondary | ICD-10-CM | POA: Diagnosis not present

## 2022-07-09 DIAGNOSIS — I7121 Aneurysm of the ascending aorta, without rupture: Secondary | ICD-10-CM | POA: Diagnosis not present

## 2022-07-09 DIAGNOSIS — K449 Diaphragmatic hernia without obstruction or gangrene: Secondary | ICD-10-CM | POA: Diagnosis not present

## 2022-07-09 DIAGNOSIS — J9811 Atelectasis: Secondary | ICD-10-CM | POA: Diagnosis not present

## 2022-07-09 DIAGNOSIS — I479 Paroxysmal tachycardia, unspecified: Secondary | ICD-10-CM | POA: Diagnosis not present

## 2022-07-09 DIAGNOSIS — I82411 Acute embolism and thrombosis of right femoral vein: Secondary | ICD-10-CM | POA: Diagnosis not present

## 2022-07-09 DIAGNOSIS — I2699 Other pulmonary embolism without acute cor pulmonale: Secondary | ICD-10-CM | POA: Diagnosis not present

## 2022-07-09 DIAGNOSIS — I2510 Atherosclerotic heart disease of native coronary artery without angina pectoris: Secondary | ICD-10-CM | POA: Diagnosis not present

## 2022-07-09 DIAGNOSIS — S72001D Fracture of unspecified part of neck of right femur, subsequent encounter for closed fracture with routine healing: Secondary | ICD-10-CM | POA: Diagnosis not present

## 2022-07-09 DIAGNOSIS — R2241 Localized swelling, mass and lump, right lower limb: Secondary | ICD-10-CM | POA: Diagnosis not present

## 2022-07-09 DIAGNOSIS — I480 Paroxysmal atrial fibrillation: Secondary | ICD-10-CM | POA: Diagnosis not present

## 2022-07-09 DIAGNOSIS — R0689 Other abnormalities of breathing: Secondary | ICD-10-CM | POA: Diagnosis not present

## 2022-07-09 DIAGNOSIS — Z955 Presence of coronary angioplasty implant and graft: Secondary | ICD-10-CM | POA: Diagnosis not present

## 2022-07-09 DIAGNOSIS — I471 Supraventricular tachycardia: Secondary | ICD-10-CM | POA: Diagnosis not present

## 2022-07-09 NOTE — Progress Notes (Signed)
Provider: Veleta Miners MD  Location:  [facility] Room Number: 38 Place of Service:  SNF ([phone])  PCP: [name], MD Patient Care Team: [name], MD as PCP - General (Internal Medicine) Sherren Mocha, MD as PCP - Cardiology (Cardiology) [name], MD (Internal Medicine) Tat, Taylor Quail, DO as Consulting Physician (Neurology)  Extended Emergency Contact Information Primary Emergency Contact: Felipa Eth Home Phone: [phone] Mobile Phone: [phone] Relation: Daughter  Code Status: DNR Managed Care Goals of Care: Advanced Directive information    07/09/2022    3:06 PM  Advanced Directives  Does Patient Have a Medical Advance Directive? No  Would patient like information on creating a medical advance directive? No - Guardian declined      Chief Complaint  Patient presents with   New Admit To SNF    Admission to SNF    HPI: Patient is a 88 y.o. female seen today for admission to SNF  Patient was admitted in the hospital from 7/26 to 7/31 for displaced fracture of right femoral neck after a fall  Patient has a history of Parkinson disease, hypertension, hyperlipidemia She fell in her kitchen in her apartment in [place] [facility].  Patient usually uses her walker to walk around and also uses a power chair  denies any dizziness In ED was found to have right Femoral Neck  fracture  underwent right hip direct anterior hemiarthroplasty on 7/27 Now discharged to SNF Patient had a junctional tachycardia in preop EKG.  Cardiology recommended 2D echo which was showed moderate AR. Normal EF They said to follow this as outpatient.  Patient is doing well denies any pain  is working with therapy  is weightbearing as tolerated  no other acute issues today  Patients Daughter called me later and told me that she has been more confused recently and she thinks it is due to [place]. She also said that patient is having Nausea and Zofran did not help her.  Hospital tried Reglan which is more effective   Past Medical History:  Diagnosis Date   Coronary artery disease    Methicillin resistant Staphylococcus aureus in conditions classified elsewhere and of unspecified site    MI (myocardial infarction) ([place])    Other and unspecified hyperlipidemia    Unspecified essential hypertension    Past Surgical History:  Procedure Laterality Date   CATARACT EXTRACTION, BILATERAL     CORONARY ANGIOPLASTY WITH STENT PLACEMENT     HIP ARTHROPLASTY Right 07/04/2022   Procedure: RIGHT ANTERIOR HIP REPLACEMENT ;  Surgeon: Mcarthur Rossetti, MD;  Location: [facility]S;  Service: Orthopedics;  Laterality: Right;    reports that she has never smoked. She has never used smokeless tobacco. She reports that she does not drink alcohol and does not use drugs. Social History   Socioeconomic History   Marital status: Married    Spouse name: Not on file   Number of children: Not on file   Years of education: Not on file   Highest education level: Not on file  Occupational History   Not on file  Tobacco Use   Smoking status: Never   Smokeless tobacco: Never  Vaping Use   Vaping Use: Never used  Substance and Sexual Activity   Alcohol use: No   Drug use: No   Sexual activity: Not on file  Other Topics Concern   Not on file  Social History Narrative   Right handed    [facility]   Social Determinants of  Health   Financial Resource Strain: Not on file  Food Insecurity: Not on file  Transportation Needs: Not on file  Physical Activity: Not on file  Stress: Not on file  Social Connections: Not on file  Intimate Partner Violence: Not on file    Functional Status Survey:    Family History  Problem Relation Age of Onset   Osteoporosis Mother    Skin cancer Mother    Stroke Father    Stroke Brother     Health Maintenance  Topic Date Due   COVID-19 Vaccine (1) Never done   TETANUS/TDAP  Never done   Pneumonia Vaccine 64+ Years old (1 -  PCV) Never done   INFLUENZA VACCINE  07/09/2022   DEXA SCAN  Completed   Zoster Vaccines- Shingrix  Completed   HPV VACCINES  Aged Out    Allergies  Allergen Reactions   Amlodipine Anaphylaxis, Swelling and Other (See Comments)    Edema   Alendronate Other (See Comments)    Dysphagia   Ramipril Cough    Outpatient Encounter Medications as of 07/09/2022  Medication Sig   aspirin EC 325 MG tablet Take 1 tablet (325 mg total) by mouth daily with breakfast.   atorvastatin (LIPITOR) 10 MG tablet Take 10 mg by mouth at bedtime.   carbidopa-levodopa (SINEMET IR) 25-100 MG tablet Take 1 tablet by mouth 3 (three) times daily.   docusate sodium (COLACE) 100 MG capsule Take 1 capsule (100 mg total) by mouth 2 (two) times daily.   furosemide (LASIX) 20 MG tablet Take 20 mg by mouth daily as needed for fluid.   HYDROcodone-acetaminophen (NORCO/VICODIN) 5-325 MG tablet Take 1 tablet by mouth every 6 (six) hours as needed for moderate pain.   metoprolol succinate (TOPROL-XL) 50 MG 24 hr tablet Take 50 mg by mouth daily.   nitroGLYCERIN (NITROSTAT) 0.4 MG SL tablet Place 1 tablet (0.4 mg total) under the tongue every 5 (five) minutes as needed for chest pain.   pantoprazole (PROTONIX) 20 MG tablet Take 20 mg by mouth daily.   polyethylene glycol (MIRALAX / GLYCOLAX) 17 g packet Take 17 g by mouth daily.   No facility-administered encounter medications on file as of 07/09/2022.    Review of Systems  Constitutional:  Negative for activity change and appetite change.  HENT: Negative.    Respiratory:  Negative for cough and shortness of breath.   Cardiovascular:  Negative for leg swelling.  Gastrointestinal:  Negative for constipation.  Genitourinary: Negative.   Musculoskeletal:  Negative for arthralgias, gait problem and myalgias.  Skin: Negative.   Neurological:  Negative for dizziness and weakness.  Psychiatric/Behavioral:  Negative for confusion, dysphoric mood and sleep disturbance.      Vitals:   07/09/22 1502  BP: 127/66  Pulse: 73  Resp: (!) 24  Temp: 98 F (36.7 C)  SpO2: 97%  Weight: 120 lb 4.8 oz (54.6 kg)  Height: '5\' 6"'$  (1.676 m)   Body mass index is 19.42 kg/m. Physical Exam Vitals reviewed.  Constitutional:      Appearance: Normal appearance.  HENT:     Head: Normocephalic.     Nose: Nose normal.     Mouth/Throat:     Mouth: Mucous membranes are moist.     Pharynx: Oropharynx is clear.  Eyes:     Pupils: Pupils are equal, round, and reactive to light.  Cardiovascular:     Rate and Rhythm: Normal rate and regular rhythm.     Pulses: Normal pulses.  Heart sounds: Normal heart sounds. No murmur heard. Pulmonary:     Effort: Pulmonary effort is normal.     Breath sounds: Normal breath sounds.  Abdominal:     General: Abdomen is flat. Bowel sounds are normal.     Palpations: Abdomen is soft.  Musculoskeletal:        General: No swelling.     Cervical back: Neck supple.  Skin:    General: Skin is warm.  Neurological:     General: No focal deficit present.     Mental Status: She is alert and oriented to person, place, and time.  Psychiatric:        Mood and Affect: Mood normal.        Thought Content: Thought content normal.     Labs reviewed: Basic Metabolic Panel: Recent Labs    07/04/22 0340 07/05/22 0335 07/06/22 0403  NA 143 142 139  K 4.1 3.4* 4.3  CL 111 110 109  CO2 '23 24 24  '$ GLUCOSE 153* 96 98  BUN 53* 52* 36*  CREATININE 1.58* 1.49* 1.29*  CALCIUM 8.8* 7.8* 8.2*  MG  --  1.9 1.8   Liver Function Tests: Recent Labs    07/04/22 0340  AST 18  ALT 6  ALKPHOS 74  BILITOT 0.7  PROT 6.1*  ALBUMIN 3.3*   No results for input(s): "LIPASE", "AMYLASE" in the last 8760 hours. No results for input(s): "AMMONIA" in the last 8760 hours. CBC: Recent Labs    07/03/22 1552 07/03/22 2123 07/04/22 0340 07/05/22 0335 07/06/22 0403  WBC 11.2*   < > 5.9 6.6 6.2  NEUTROABS 9.7*  --   --  4.7 4.6  HGB 13.3   < >  11.5* 9.6* 9.3*  HCT 39.7   < > 35.5* 29.5* 28.7*  MCV 87.3   < > 90.6 89.4 89.7  PLT 210   < > 178 145* 144*   < > = values in this interval not displayed.   Cardiac Enzymes: Recent Labs    07/03/22 1552  CKTOTAL 118   BNP: Invalid input(s): "POCBNP" No results found for: "HGBA1C" Lab Results  Component Value Date   TSH 0.48 07/22/2017   No results found for: "VITAMINB12" No results found for: "FOLATE" No results found for: "IRON", "TIBC", "FERRITIN"  Imaging and Procedures obtained prior to SNF admission: Pelvis Portable  Result Date: 07/04/2022 CLINICAL DATA:  Postoperative hip replacement EXAM: PORTABLE PELVIS 1-2 VIEWS COMPARISON:  Pelvis and right hip radiographs 07/03/2022 FINDINGS: Interval total right hip arthroplasty. No perihardware lucency is seen to indicate hardware failure or loosening. Expected postoperative changes including subcutaneous air and soft tissue swelling about the right hip. Lateral right hip surgical skin staples. Mild pubic symphysis osteoarthritis. No acute fracture or dislocation. IMPRESSION: Interval total right hip arthroplasty without evidence of hardware failure. Electronically Signed   By: Yvonne Kendall M.D.   On: 07/04/2022 12:43   DG HIP UNILAT WITH PELVIS 1V RIGHT  Result Date: 07/04/2022 CLINICAL DATA:  Fluoroscopic assistance for right hip arthroplasty EXAM: DG HIP (WITH OR WITHOUT PELVIS) 1V RIGHT COMPARISON:  None Available. FINDINGS: Fluoroscopic images show right hip arthroplasty. Fluoroscopy time 7 seconds. Radiation dose 0.8 mGy. IMPRESSION: Fluoroscopic assistance was provided for right hip arthroplasty. Electronically Signed   By: Elmer Picker M.D.   On: 07/04/2022 11:59   DG C-Arm 1-60 Min-No Report  Result Date: 07/04/2022 Fluoroscopy was utilized by the requesting physician.  No radiographic interpretation.   ECHOCARDIOGRAM COMPLETE  Result Date: 07/04/2022    ECHOCARDIOGRAM REPORT   Patient Name:   Taylor Mendoza  Date of Exam: 07/04/2022 Medical Rec #:  [id number]       Height:       66.0 in Accession #:    [id number]      Weight:       119.7 lb Date of Birth:  1930-09-16       BSA:          1.608 m Patient Age:    41 years        BP:           126/77 mmHg Patient Gender: F               HR:           46 bpm. Exam Location:  Inpatient Procedure: 2D Echo, Cardiac Doppler, Color Doppler and Intracardiac            Opacification Agent STAT ECHO Indications:     Abnormal ECG R94.31  History:         Patient has prior history of Echocardiogram examinations, most                  recent 10/04/2016. CAD and Previous Myocardial Infarction;                  Arrythmias:Bradycardia. Fractured hip.  Sonographer:     Darlina Sicilian RDCS Referring Phys:  [place] Diagnosing Phys: Kirk Ruths McleanMD IMPRESSIONS  1. Left ventricular ejection fraction, by estimation, is 50%. The left ventricle has mildly decreased function. The left ventricle demonstrates global hypokinesis. There is mild concentric left ventricular hypertrophy. Left ventricular diastolic parameters are consistent with Grade I diastolic dysfunction (impaired relaxation).  2. Right ventricular systolic function is normal. The right ventricular size is normal. There is moderately elevated pulmonary artery systolic pressure. The estimated right ventricular systolic pressure is 67.6 mmHg.  3. Left atrial size was moderately dilated.  4. Right atrial size was mildly dilated.  5. The mitral valve is normal in structure. Mild mitral valve regurgitation. No evidence of mitral stenosis.  6. The aortic valve is tricuspid. There is mild calcification of the aortic valve. Aortic valve regurgitation is moderate.  7. Aortic dilatation noted. There is severe dilatation of the ascending aorta, measuring 52 mm.  8. The inferior vena cava is dilated in size with <50% respiratory variability, suggesting right atrial pressure of 15 mmHg.  9. A small pericardial effusion is present.  FINDINGS  Left Ventricle: Left ventricular ejection fraction, by estimation, is 50%. The left ventricle has mildly decreased function. The left ventricle demonstrates global hypokinesis. Definity contrast agent was given IV to delineate the left ventricular endocardial borders. The left ventricular internal cavity size was normal in size. There is mild concentric left ventricular hypertrophy. Left ventricular diastolic parameters are consistent with Grade I diastolic dysfunction (impaired relaxation). Right Ventricle: The right ventricular size is normal. No increase in right ventricular wall thickness. Right ventricular systolic function is normal. There is moderately elevated pulmonary artery systolic pressure. The tricuspid regurgitant velocity is 2.99 m/s, and with an assumed right atrial pressure of 15 mmHg, the estimated right ventricular systolic pressure is 19.5 mmHg. Left Atrium: Left atrial size was moderately dilated. Right Atrium: Right atrial size was mildly dilated. Pericardium: A small pericardial effusion is present. Mitral Valve: The mitral valve is normal in structure. Mild mitral valve regurgitation. No evidence of mitral valve stenosis.  Tricuspid Valve: The tricuspid valve is normal in structure. Tricuspid valve regurgitation is mild. Aortic Valve: The aortic valve is tricuspid. There is mild calcification of the aortic valve. Aortic valve regurgitation is moderate. Aortic regurgitation PHT measures 842 msec. Pulmonic Valve: The pulmonic valve was normal in structure. Pulmonic valve regurgitation is trivial. Aorta: Aortic dilatation noted. There is severe dilatation of the ascending aorta, measuring 52 mm. Venous: The inferior vena cava is dilated in size with less than 50% respiratory variability, suggesting right atrial pressure of 15 mmHg. IAS/Shunts: No atrial level shunt detected by color flow Doppler.  LEFT VENTRICLE PLAX 2D LVIDd:         5.60 cm     Diastology LVIDs:         3.20 cm      LV e' medial:    3.23 cm/s LV PW:         1.00 cm     LV E/e' medial:  13.0 LV IVS:        0.90 cm     LV e' lateral:   3.38 cm/s LVOT diam:     2.00 cm     LV E/e' lateral: 12.4 LVOT Area:     3.14 cm  LV Volumes (MOD) LV vol d, MOD A2C: 85.5 ml LV vol d, MOD A4C: 91.5 ml LV vol s, MOD A2C: 48.3 ml LV vol s, MOD A4C: 48.2 ml LV SV MOD A2C:     37.2 ml LV SV MOD A4C:     91.5 ml LV SV MOD BP:      41.3 ml RIGHT VENTRICLE RV Basal diam:  4.70 cm RV Mid diam:    2.40 cm RV S prime:     15.20 cm/s TAPSE (M-mode): 2.0 cm LEFT ATRIUM             Index        RIGHT ATRIUM           Index LA diam:        3.20 cm 1.99 cm/m   RA Area:     18.80 cm LA Vol (A2C):   90.4 ml 56.22 ml/m  RA Volume:   48.00 ml  29.85 ml/m LA Vol (A4C):   39.3 ml 24.44 ml/m LA Biplane Vol: 59.8 ml 37.19 ml/m  AORTIC VALVE AI PHT:      842 msec  AORTA Ao Root diam: 3.10 cm Ao STJ diam:  3.1 cm Ao Asc diam:  4.90 cm MITRAL VALVE               TRICUSPID VALVE MV Area (PHT): 1.94 cm    TR Peak grad:   35.8 mmHg MV Decel Time: 391 msec    TR Vmax:        299.00 cm/s MV E velocity: 42.00 cm/s MV A velocity: 84.80 cm/s  SHUNTS MV E/A ratio:  0.50        Systemic Diam: 2.00 cm Dalton McleanMD Electronically signed by Franki Monte Signature Date/Time: 07/04/2022/10:12:09 AM    Final (Updated)    DG Knee Complete 4 Views Right  Result Date: 07/03/2022 CLINICAL DATA:  Right hip fracture EXAM: RIGHT KNEE - COMPLETE 4+ VIEW COMPARISON:  None FINDINGS: Anterior soft tissue swelling. Osteoarthritis most pronounced in the lateral compartment. No evidence of fracture. Question old healed fracture of the proximal fibula. IMPRESSION: No acute fracture. Mild soft tissue swelling. Osteoarthritis most pronounced in the lateral compartment. Question old healed fracture of the proximal  fibula. Electronically Signed   By: Nelson Chimes M.D.   On: 07/03/2022 17:16   DG Chest 1 View  Result Date: 07/03/2022 CLINICAL DATA:  Hip fracture EXAM: CHEST  1 VIEW  COMPARISON:  12/24/2015 FINDINGS: Chronic cardiomegaly and aortic tortuosity. The lungs are clear. The vascularity is normal. No effusions. No visible rib fracture. IMPRESSION: Cardiomegaly and aortic atherosclerosis.  No active disease. Electronically Signed   By: Nelson Chimes M.D.   On: 07/03/2022 17:14   DG Hip Unilat With Pelvis 2-3 Views Right  Result Date: 07/03/2022 CLINICAL DATA:  Fall with pain EXAM: DG HIP (WITH OR WITHOUT PELVIS) 2-3V RIGHT COMPARISON:  None FINDINGS: Femoral neck fracture with mild displacement. No intertrochanteric component. Right hemipelvis is negative. IMPRESSION: Displaced femoral neck fracture on the right. Electronically Signed   By: Nelson Chimes M.D.   On: 07/03/2022 17:14    Assessment/Plan 1. History of right hip hemiarthroplasty Will Try Tylenol 650 mg TID to decrease the Norco use as daughter thinks it is causing confusion Norco only for moderate pain Aspirin 325 mg WBAT Robaxin 250 mg QHS prn  2. Stage 3b chronic kidney disease (HCC) Creat stable Repeat labs in 1 week  3. Parkinson disease ([place]) Continue Sinemet  4. Hyperlipidemia, unspecified hyperlipidemia type On statin  5. Essential hypertension On Toprol  6. Gastroesophageal reflux disease without esophagitis On Protonix Will also add Reglan to help with Nausea for 4 weeks Per daughter Zofran does not help her   7 Junctional Rhythm in Preop Echo showed Mod AR Per daughter patient does get fatigue and tired with Palpitations She has already made appointment with cardiology   Family/ staff Communication:   Labs/tests ordered: CBC BMP in 1 week

## 2022-07-16 ENCOUNTER — Ambulatory Visit: Payer: PPO | Admitting: Cardiovascular Disease

## 2022-07-16 ENCOUNTER — Encounter: Payer: Self-pay | Admitting: Internal Medicine

## 2022-07-16 ENCOUNTER — Non-Acute Institutional Stay (SKILLED_NURSING_FACILITY): Payer: PPO | Admitting: Internal Medicine

## 2022-07-16 DIAGNOSIS — F5101 Primary insomnia: Secondary | ICD-10-CM

## 2022-07-16 DIAGNOSIS — R110 Nausea: Secondary | ICD-10-CM | POA: Diagnosis not present

## 2022-07-16 DIAGNOSIS — M7989 Other specified soft tissue disorders: Secondary | ICD-10-CM | POA: Diagnosis not present

## 2022-07-16 NOTE — Progress Notes (Addendum)
Location: [facility] Room Number: 79 Place of Service:  SNF [phone])  Provider: Veleta Miners MD  Code Status: DNR Managed Care Goals of Care:     07/09/2022    3:06 PM  Advanced Directives  Does Patient Have a Medical Advance Directive? No  Would patient like information on creating a medical advance directive? No - Guardian declined     Chief Complaint  Patient presents with   Acute Visit    Swelling in the leg    HPI: Patient is a 88 y.o. female seen today for an acute visit for Swelling in her right Leg And Insomnia  Patient was admitted in the hospital from 7/26 to 7/31 for displaced fracture of right femoral neck after a fall  Now in SNF for Rehab Patient seen today as had Right Leg swelling No Pain or any discomfort in her leg No SOB or Chest Pain  Also Daughter in law in the room saying that she needs some help with sleeping at night as she is not sleeping well Nausea is better on Reglan  Still needing assist with her ADLS Pain controlled  Patient also  has a history of Parkinson disease, hypertension, hyperlipidemia  Past Medical History:  Diagnosis Date   Coronary artery disease    Methicillin resistant Staphylococcus aureus in conditions classified elsewhere and of unspecified site    MI (myocardial infarction) ([place])    Other and unspecified hyperlipidemia    Unspecified essential hypertension     Past Surgical History:  Procedure Laterality Date   CATARACT EXTRACTION, BILATERAL     CORONARY ANGIOPLASTY WITH STENT PLACEMENT     HIP ARTHROPLASTY Right 07/04/2022   Procedure: RIGHT ANTERIOR HIP REPLACEMENT ;  Surgeon: Mcarthur Rossetti, MD;  Location: [facility];  Service: Orthopedics;  Laterality: Right;    Allergies  Allergen Reactions   Amlodipine Anaphylaxis, Swelling and Other (See Comments)    Edema   Alendronate Other (See Comments)    Dysphagia   Ramipril Cough    Outpatient Encounter Medications as of 07/16/2022   Medication Sig   acetaminophen (TYLENOL) 325 MG tablet Take 650 mg by mouth 3 (three) times daily.   aspirin EC 325 MG tablet Take 1 tablet (325 mg total) by mouth daily with breakfast.   atorvastatin (LIPITOR) 10 MG tablet Take 10 mg by mouth at bedtime.   carbidopa-levodopa (SINEMET IR) 25-100 MG tablet Take 1 tablet by mouth 3 (three) times daily.   docusate sodium (COLACE) 100 MG capsule Take 1 capsule (100 mg total) by mouth 2 (two) times daily.   furosemide (LASIX) 20 MG tablet Take 20 mg by mouth daily as needed for fluid.   HYDROcodone-acetaminophen (NORCO/VICODIN) 5-325 MG tablet Take 1 tablet by mouth every 6 (six) hours as needed for moderate pain.   methocarbamol (ROBAXIN) 500 MG tablet Take 250 mg by mouth at bedtime as needed for muscle spasms.   metoCLOPramide (REGLAN) 5 MG tablet Take 5 mg by mouth 2 (two) times daily.   metoprolol succinate (TOPROL-XL) 50 MG 24 hr tablet Take 50 mg by mouth daily.   nitroGLYCERIN (NITROSTAT) 0.4 MG SL tablet Place 1 tablet (0.4 mg total) under the tongue every 5 (five) minutes as needed for chest pain.   pantoprazole (PROTONIX) 20 MG tablet Take 20 mg by mouth daily.   polyethylene glycol (MIRALAX / GLYCOLAX) 17 g packet Take 17 g by mouth daily.   No facility-administered encounter medications on file as of  07/16/2022.    Review of Systems:  Review of Systems  Constitutional:  Negative for activity change and appetite change.  HENT: Negative.    Respiratory:  Negative for cough and shortness of breath.   Cardiovascular:  Positive for leg swelling.  Gastrointestinal:  Negative for constipation.  Genitourinary: Negative.   Musculoskeletal:  Positive for gait problem. Negative for arthralgias and myalgias.  Skin: Negative.   Neurological:  Negative for dizziness and weakness.  Psychiatric/Behavioral:  Positive for dysphoric mood and sleep disturbance. Negative for confusion. The patient is nervous/anxious.     Health Maintenance  Topic  Date Due   COVID-19 Vaccine (5 - Mixed Product series) 07/11/2021   INFLUENZA VACCINE  07/09/2022   TETANUS/TDAP  06/14/2026   Pneumonia Vaccine 31+ Years old  Completed   DEXA SCAN  Completed   Zoster Vaccines- Shingrix  Completed   HPV VACCINES  Aged Out    Physical Exam: Vitals:   07/16/22 1610  BP: 118/66  Pulse: 64  Resp: 18  Temp: 98 F (36.7 C)  SpO2: 97%  Weight: 119 lb 3.2 oz (54.1 kg)  Height: '5\' 6"'$  (1.676 m)   Body mass index is 19.24 kg/m. Physical Exam Vitals reviewed.  Constitutional:      Appearance: Normal appearance.  HENT:     Head: Normocephalic.     Nose: Nose normal.     Mouth/Throat:     Mouth: Mucous membranes are moist.     Pharynx: Oropharynx is clear.  Eyes:     Pupils: Pupils are equal, round, and reactive to light.  Cardiovascular:     Rate and Rhythm: Normal rate and regular rhythm.     Pulses: Normal pulses.     Heart sounds: Normal heart sounds. No murmur heard. Pulmonary:     Effort: Pulmonary effort is normal.     Breath sounds: Normal breath sounds.  Abdominal:     General: Abdomen is flat. Bowel sounds are normal.     Palpations: Abdomen is soft.  Musculoskeletal:        General: No swelling.     Cervical back: Neck supple.     Comments: Swelling in her Right Leg No calf tenderness or Pain or redness  Skin:    General: Skin is warm.  Neurological:     General: No focal deficit present.     Mental Status: She is alert and oriented to person, place, and time.  Psychiatric:        Mood and Affect: Mood normal.        Thought Content: Thought content normal.     Labs reviewed: Basic Metabolic Panel: Recent Labs    07/04/22 0340 07/05/22 0335 07/06/22 0403  NA 143 142 139  K 4.1 3.4* 4.3  CL 111 110 109  CO2 '23 24 24  '$ GLUCOSE 153* 96 98  BUN 53* 52* 36*  CREATININE 1.58* 1.49* 1.29*  CALCIUM 8.8* 7.8* 8.2*  MG  --  1.9 1.8   Liver Function Tests: Recent Labs    07/04/22 0340  AST 18  ALT 6  ALKPHOS 74   BILITOT 0.7  PROT 6.1*  ALBUMIN 3.3*   No results for input(s): "LIPASE", "AMYLASE" in the last 8760 hours. No results for input(s): "AMMONIA" in the last 8760 hours. CBC: Recent Labs    07/03/22 1552 07/03/22 2123 07/04/22 0340 07/05/22 0335 07/06/22 0403  WBC 11.2*   < > 5.9 6.6 6.2  NEUTROABS 9.7*  --   --  4.7 4.6  HGB 13.3   < > 11.5* 9.6* 9.3*  HCT 39.7   < > 35.5* 29.5* 28.7*  MCV 87.3   < > 90.6 89.4 89.7  PLT 210   < > 178 145* 144*   < > = values in this interval not displayed.   Lipid Panel: No results for input(s): "CHOL", "HDL", "LDLCALC", "TRIG", "CHOLHDL", "LDLDIRECT" in the last 8760 hours. No results found for: "HGBA1C"  Procedures since last visit: Pelvis Portable  Result Date: 07/04/2022 CLINICAL DATA:  Postoperative hip replacement EXAM: PORTABLE PELVIS 1-2 VIEWS COMPARISON:  Pelvis and right hip radiographs 07/03/2022 FINDINGS: Interval total right hip arthroplasty. No perihardware lucency is seen to indicate hardware failure or loosening. Expected postoperative changes including subcutaneous air and soft tissue swelling about the right hip. Lateral right hip surgical skin staples. Mild pubic symphysis osteoarthritis. No acute fracture or dislocation. IMPRESSION: Interval total right hip arthroplasty without evidence of hardware failure. Electronically Signed   By: Yvonne Kendall M.D.   On: 07/04/2022 12:43   DG HIP UNILAT WITH PELVIS 1V RIGHT  Result Date: 07/04/2022 CLINICAL DATA:  Fluoroscopic assistance for right hip arthroplasty EXAM: DG HIP (WITH OR WITHOUT PELVIS) 1V RIGHT COMPARISON:  None Available. FINDINGS: Fluoroscopic images show right hip arthroplasty. Fluoroscopy time 7 seconds. Radiation dose 0.8 mGy. IMPRESSION: Fluoroscopic assistance was provided for right hip arthroplasty. Electronically Signed   By: Elmer Picker M.D.   On: 07/04/2022 11:59   DG C-Arm 1-60 Min-No Report  Result Date: 07/04/2022 Fluoroscopy was utilized by the  requesting physician.  No radiographic interpretation.   ECHOCARDIOGRAM COMPLETE  Result Date: 07/04/2022    ECHOCARDIOGRAM REPORT   Patient Name:   Taylor Mendoza Date of Exam: 07/04/2022 Medical Rec #:  [id number]       Height:       66.0 in Accession #:    [id number]      Weight:       119.7 lb Date of Birth:  1929/12/12       BSA:          1.608 m Patient Age:    81 years        BP:           126/77 mmHg Patient Gender: F               HR:           46 bpm. Exam Location:  Inpatient Procedure: 2D Echo, Cardiac Doppler, Color Doppler and Intracardiac            Opacification Agent STAT ECHO Indications:     Abnormal ECG R94.31  History:         Patient has prior history of Echocardiogram examinations, most                  recent 10/04/2016. CAD and Previous Myocardial Infarction;                  Arrythmias:Bradycardia. Fractured hip.  Sonographer:     Darlina Sicilian RDCS Referring Phys:  [place] Diagnosing Phys: Kirk Ruths McleanMD IMPRESSIONS  1. Left ventricular ejection fraction, by estimation, is 50%. The left ventricle has mildly decreased function. The left ventricle demonstrates global hypokinesis. There is mild concentric left ventricular hypertrophy. Left ventricular diastolic parameters are consistent with Grade I diastolic dysfunction (impaired relaxation).  2. Right ventricular systolic function is normal. The right ventricular size is normal. There is moderately elevated  pulmonary artery systolic pressure. The estimated right ventricular systolic pressure is 38.1 mmHg.  3. Left atrial size was moderately dilated.  4. Right atrial size was mildly dilated.  5. The mitral valve is normal in structure. Mild mitral valve regurgitation. No evidence of mitral stenosis.  6. The aortic valve is tricuspid. There is mild calcification of the aortic valve. Aortic valve regurgitation is moderate.  7. Aortic dilatation noted. There is severe dilatation of the ascending aorta, measuring 52 mm.  8. The  inferior vena cava is dilated in size with <50% respiratory variability, suggesting right atrial pressure of 15 mmHg.  9. A small pericardial effusion is present. FINDINGS  Left Ventricle: Left ventricular ejection fraction, by estimation, is 50%. The left ventricle has mildly decreased function. The left ventricle demonstrates global hypokinesis. Definity contrast agent was given IV to delineate the left ventricular endocardial borders. The left ventricular internal cavity size was normal in size. There is mild concentric left ventricular hypertrophy. Left ventricular diastolic parameters are consistent with Grade I diastolic dysfunction (impaired relaxation). Right Ventricle: The right ventricular size is normal. No increase in right ventricular wall thickness. Right ventricular systolic function is normal. There is moderately elevated pulmonary artery systolic pressure. The tricuspid regurgitant velocity is 2.99 m/s, and with an assumed right atrial pressure of 15 mmHg, the estimated right ventricular systolic pressure is 82.9 mmHg. Left Atrium: Left atrial size was moderately dilated. Right Atrium: Right atrial size was mildly dilated. Pericardium: A small pericardial effusion is present. Mitral Valve: The mitral valve is normal in structure. Mild mitral valve regurgitation. No evidence of mitral valve stenosis. Tricuspid Valve: The tricuspid valve is normal in structure. Tricuspid valve regurgitation is mild. Aortic Valve: The aortic valve is tricuspid. There is mild calcification of the aortic valve. Aortic valve regurgitation is moderate. Aortic regurgitation PHT measures 842 msec. Pulmonic Valve: The pulmonic valve was normal in structure. Pulmonic valve regurgitation is trivial. Aorta: Aortic dilatation noted. There is severe dilatation of the ascending aorta, measuring 52 mm. Venous: The inferior vena cava is dilated in size with less than 50% respiratory variability, suggesting right atrial pressure of 15  mmHg. IAS/Shunts: No atrial level shunt detected by color flow Doppler.  LEFT VENTRICLE PLAX 2D LVIDd:         5.60 cm     Diastology LVIDs:         3.20 cm     LV e' medial:    3.23 cm/s LV PW:         1.00 cm     LV E/e' medial:  13.0 LV IVS:        0.90 cm     LV e' lateral:   3.38 cm/s LVOT diam:     2.00 cm     LV E/e' lateral: 12.4 LVOT Area:     3.14 cm  LV Volumes (MOD) LV vol d, MOD A2C: 85.5 ml LV vol d, MOD A4C: 91.5 ml LV vol s, MOD A2C: 48.3 ml LV vol s, MOD A4C: 48.2 ml LV SV MOD A2C:     37.2 ml LV SV MOD A4C:     91.5 ml LV SV MOD BP:      41.3 ml RIGHT VENTRICLE RV Basal diam:  4.70 cm RV Mid diam:    2.40 cm RV S prime:     15.20 cm/s TAPSE (M-mode): 2.0 cm LEFT ATRIUM             Index  RIGHT ATRIUM           Index LA diam:        3.20 cm 1.99 cm/m   RA Area:     18.80 cm LA Vol (A2C):   90.4 ml 56.22 ml/m  RA Volume:   48.00 ml  29.85 ml/m LA Vol (A4C):   39.3 ml 24.44 ml/m LA Biplane Vol: 59.8 ml 37.19 ml/m  AORTIC VALVE AI PHT:      842 msec  AORTA Ao Root diam: 3.10 cm Ao STJ diam:  3.1 cm Ao Asc diam:  4.90 cm MITRAL VALVE               TRICUSPID VALVE MV Area (PHT): 1.94 cm    TR Peak grad:   35.8 mmHg MV Decel Time: 391 msec    TR Vmax:        299.00 cm/s MV E velocity: 42.00 cm/s MV A velocity: 84.80 cm/s  SHUNTS MV E/A ratio:  0.50        Systemic Diam: 2.00 cm Dalton McleanMD Electronically signed by Franki Monte Signature Date/Time: 07/04/2022/10:12:09 AM    Final (Updated)    DG Knee Complete 4 Views Right  Result Date: 07/03/2022 CLINICAL DATA:  Right hip fracture EXAM: RIGHT KNEE - COMPLETE 4+ VIEW COMPARISON:  None FINDINGS: Anterior soft tissue swelling. Osteoarthritis most pronounced in the lateral compartment. No evidence of fracture. Question old healed fracture of the proximal fibula. IMPRESSION: No acute fracture. Mild soft tissue swelling. Osteoarthritis most pronounced in the lateral compartment. Question old healed fracture of the proximal fibula.  Electronically Signed   By: Nelson Chimes M.D.   On: 07/03/2022 17:16   DG Chest 1 View  Result Date: 07/03/2022 CLINICAL DATA:  Hip fracture EXAM: CHEST  1 VIEW COMPARISON:  12/24/2015 FINDINGS: Chronic cardiomegaly and aortic tortuosity. The lungs are clear. The vascularity is normal. No effusions. No visible rib fracture. IMPRESSION: Cardiomegaly and aortic atherosclerosis.  No active disease. Electronically Signed   By: Nelson Chimes M.D.   On: 07/03/2022 17:14   DG Hip Unilat With Pelvis 2-3 Views Right  Result Date: 07/03/2022 CLINICAL DATA:  Fall with pain EXAM: DG HIP (WITH OR WITHOUT PELVIS) 2-3V RIGHT COMPARISON:  None FINDINGS: Femoral neck fracture with mild displacement. No intertrochanteric component. Right hemipelvis is negative. IMPRESSION: Displaced femoral neck fracture on the right. Electronically Signed   By: Nelson Chimes M.D.   On: 07/03/2022 17:14    Assessment/Plan  1. Right leg swelling Dopplers to rule out DVT Lasix 20 mg QD for 3 days with Potassium  2. Nausea Mostly resolved with Reglan   3. Primary insomnia Melatonin '2mg'$  QHS If does not work will try Remeron  Other Issues  History of right hip hemiarthroplasty Will Try Tylenol 650 mg TID to decrease the Norco use as daughter thinks it is causing confusion Norco only for moderate pain Aspirin 325 mg WBAT Robaxin 250 mg QHS prn   2. Stage 3b chronic kidney disease (HCC) Creat stable Repeat labs in 1 week   3. Parkinson disease ([place]) Continue Sinemet   4. Hyperlipidemia, unspecified hyperlipidemia type On statin   5. Essential hypertension On Toprol   6. Gastroesophageal reflux disease without esophagitis On Protonix Will also add Reglan to help with Nausea for 4 weeks Per daughter Zofran does not help her    7 Junctional Rhythm in Preop Echo showed Mod AR Per daughter patient does get fatigue and tired with Palpitations She  has already made appointment with cardiology     Labs/tests  ordered:  * No order type specified * Next appt:  Visit date not found

## 2022-07-19 ENCOUNTER — Non-Acute Institutional Stay (SKILLED_NURSING_FACILITY): Payer: PPO | Admitting: Adult Health

## 2022-07-19 ENCOUNTER — Encounter: Payer: Self-pay | Admitting: Adult Health

## 2022-07-19 DIAGNOSIS — I82411 Acute embolism and thrombosis of right femoral vein: Secondary | ICD-10-CM | POA: Diagnosis not present

## 2022-07-19 NOTE — Progress Notes (Unsigned)
Location:  [facility] Room Number: NO/65/A Place of Service:  SNF (31) Provider:  Durenda Age, DNP, FNP-BC  Patient Care Team: [name], MD as PCP - General (Internal Medicine) Sherren Mocha, MD as PCP - Cardiology (Cardiology) [name], MD (Internal Medicine) Tat, Eustace Quail, DO as Consulting Physician (Neurology)  Extended Emergency Contact Information Primary Emergency Contact: Felipa Eth Home Phone: [phone] Mobile Phone: [phone] Relation: Daughter  Code Status:  DNR  Goals of care: Advanced Directive information    07/09/2022    3:06 PM  Advanced Directives  Does Patient Have a Medical Advance Directive? No  Would patient like information on creating a medical advance directive? No - Guardian declined     Chief Complaint  Patient presents with   Acute Visit    Patient is here for possible DVT    HPI:  Pt is a 88 y.o. female seen today for medical management of chronic diseases.  ***   Past Medical History:  Diagnosis Date   Coronary artery disease    Methicillin resistant Staphylococcus aureus in conditions classified elsewhere and of unspecified site    MI (myocardial infarction) ([place])    Other and unspecified hyperlipidemia    Unspecified essential hypertension    Past Surgical History:  Procedure Laterality Date   CATARACT EXTRACTION, BILATERAL     CORONARY ANGIOPLASTY WITH STENT PLACEMENT     HIP ARTHROPLASTY Right 07/04/2022   Procedure: RIGHT ANTERIOR HIP REPLACEMENT ;  Surgeon: Mcarthur Rossetti, MD;  Location: [facility];  Service: Orthopedics;  Laterality: Right;    Allergies  Allergen Reactions   Amlodipine Anaphylaxis, Swelling and Other (See Comments)    Edema   Alendronate Other (See Comments)    Dysphagia   Ramipril Cough    Outpatient Encounter Medications as of 07/19/2022  Medication Sig   acetaminophen (TYLENOL) 325 MG tablet Take 650 mg by mouth 3 (three) times daily.    aspirin EC 325 MG tablet Take 1 tablet (325 mg total) by mouth daily with breakfast.   atorvastatin (LIPITOR) 10 MG tablet Take 10 mg by mouth at bedtime.   carbidopa-levodopa (SINEMET IR) 25-100 MG tablet Take 1 tablet by mouth 3 (three) times daily.   docusate sodium (COLACE) 100 MG capsule Take 1 capsule (100 mg total) by mouth 2 (two) times daily.   furosemide (LASIX) 20 MG tablet Take 20 mg by mouth daily as needed for fluid.   HYDROcodone-acetaminophen (NORCO/VICODIN) 5-325 MG tablet Take 1 tablet by mouth every 6 (six) hours as needed for moderate pain.   methocarbamol (ROBAXIN) 500 MG tablet Take 250 mg by mouth at bedtime as needed for muscle spasms.   metoCLOPramide (REGLAN) 5 MG tablet Take 5 mg by mouth 2 (two) times daily.   metoprolol succinate (TOPROL-XL) 50 MG 24 hr tablet Take 50 mg by mouth daily.   pantoprazole (PROTONIX) 20 MG tablet Take 20 mg by mouth daily.   polyethylene glycol (MIRALAX / GLYCOLAX) 17 g packet Take 17 g by mouth daily.   nitroGLYCERIN (NITROSTAT) 0.4 MG SL tablet Place 1 tablet (0.4 mg total) under the tongue every 5 (five) minutes as needed for chest pain.   No facility-administered encounter medications on file as of 07/19/2022.    Review of Systems ***    Immunization History  Administered Date(s) Administered   Influenza Split 09/24/2010, 09/04/2011, 09/18/2012, 09/08/2013, 09/09/2015, 10/21/2016, 09/22/2019, 09/13/2021   Influenza-Unspecified 11/09/2014, 09/30/2015, 09/17/2016, 09/01/2017, 09/10/2018   Moderna Sars-Covid-2 Vaccination 12/13/2019,  01/09/2021, 05/16/2021   PFIZER(Purple Top)SARS-COV-2 Vaccination 01/10/2020   Pneumococcal Conjugate-13 04/18/2014   Pneumococcal Polysaccharide-23 09/20/2002, 09/23/2019   Td 10/11/2004   Tdap 06/14/2016   Zoster Recombinat (Shingrix) 02/10/2019, 05/04/2019   Zoster, Live 01/23/2009   Pertinent  Health Maintenance Due  Topic Date Due   INFLUENZA VACCINE  07/09/2022   DEXA SCAN  Completed       07/06/2022    7:25 AM 07/06/2022    8:30 PM 07/07/2022    9:59 AM 07/07/2022    9:25 PM 07/08/2022    9:00 AM  Fall Risk  Patient Fall Risk Level High fall risk High fall risk High fall risk High fall risk High fall risk     Vitals:   07/19/22 1440  BP: 132/76  Pulse: 82  Resp: 18  SpO2: 97%  Weight: 125 lb (56.7 kg)  Height: '5\' 6"'$  (1.676 m)   Body mass index is 20.18 kg/m.  Physical Exam     Labs reviewed: Recent Labs    07/04/22 0340 07/05/22 0335 07/06/22 0403  NA 143 142 139  K 4.1 3.4* 4.3  CL 111 110 109  CO2 '23 24 24  '$ GLUCOSE 153* 96 98  BUN 53* 52* 36*  CREATININE 1.58* 1.49* 1.29*  CALCIUM 8.8* 7.8* 8.2*  MG  --  1.9 1.8   Recent Labs    07/04/22 0340  AST 18  ALT 6  ALKPHOS 74  BILITOT 0.7  PROT 6.1*  ALBUMIN 3.3*   Recent Labs    07/03/22 1552 07/03/22 2123 07/04/22 0340 07/05/22 0335 07/06/22 0403  WBC 11.2*   < > 5.9 6.6 6.2  NEUTROABS 9.7*  --   --  4.7 4.6  HGB 13.3   < > 11.5* 9.6* 9.3*  HCT 39.7   < > 35.5* 29.5* 28.7*  MCV 87.3   < > 90.6 89.4 89.7  PLT 210   < > 178 145* 144*   < > = values in this interval not displayed.   Lab Results  Component Value Date   TSH 0.48 07/22/2017   No results found for: "HGBA1C" Lab Results  Component Value Date   CHOL 145 09/22/2009   HDL 52.60 09/22/2009   LDLCALC 73 09/22/2009   TRIG 97.0 09/22/2009   CHOLHDL 3 09/22/2009    Significant Diagnostic Results in last 30 days:  Pelvis Portable  Result Date: 07/04/2022 CLINICAL DATA:  Postoperative hip replacement EXAM: PORTABLE PELVIS 1-2 VIEWS COMPARISON:  Pelvis and right hip radiographs 07/03/2022 FINDINGS: Interval total right hip arthroplasty. No perihardware lucency is seen to indicate hardware failure or loosening. Expected postoperative changes including subcutaneous air and soft tissue swelling about the right hip. Lateral right hip surgical skin staples. Mild pubic symphysis osteoarthritis. No acute fracture or  dislocation. IMPRESSION: Interval total right hip arthroplasty without evidence of hardware failure. Electronically Signed   By: Yvonne Kendall M.D.   On: 07/04/2022 12:43   DG HIP UNILAT WITH PELVIS 1V RIGHT  Result Date: 07/04/2022 CLINICAL DATA:  Fluoroscopic assistance for right hip arthroplasty EXAM: DG HIP (WITH OR WITHOUT PELVIS) 1V RIGHT COMPARISON:  None Available. FINDINGS: Fluoroscopic images show right hip arthroplasty. Fluoroscopy time 7 seconds. Radiation dose 0.8 mGy. IMPRESSION: Fluoroscopic assistance was provided for right hip arthroplasty. Electronically Signed   By: Elmer Picker M.D.   On: 07/04/2022 11:59   DG C-Arm 1-60 Min-No Report  Result Date: 07/04/2022 Fluoroscopy was utilized by the requesting physician.  No radiographic interpretation.  ECHOCARDIOGRAM COMPLETE  Result Date: 07/04/2022    ECHOCARDIOGRAM REPORT   Patient Name:   ALIZIA GREIF Date of Exam: 07/04/2022 Medical Rec #:  [id number]       Height:       66.0 in Accession #:    [id number]      Weight:       119.7 lb Date of Birth:  1930-01-09       BSA:          1.608 m Patient Age:    44 years        BP:           126/77 mmHg Patient Gender: F               HR:           46 bpm. Exam Location:  Inpatient Procedure: 2D Echo, Cardiac Doppler, Color Doppler and Intracardiac            Opacification Agent STAT ECHO Indications:     Abnormal ECG R94.31  History:         Patient has prior history of Echocardiogram examinations, most                  recent 10/04/2016. CAD and Previous Myocardial Infarction;                  Arrythmias:Bradycardia. Fractured hip.  Sonographer:     Darlina Sicilian RDCS Referring Phys:  [place] Diagnosing Phys: Kirk Ruths McleanMD IMPRESSIONS  1. Left ventricular ejection fraction, by estimation, is 50%. The left ventricle has mildly decreased function. The left ventricle demonstrates global hypokinesis. There is mild concentric left ventricular hypertrophy. Left ventricular  diastolic parameters are consistent with Grade I diastolic dysfunction (impaired relaxation).  2. Right ventricular systolic function is normal. The right ventricular size is normal. There is moderately elevated pulmonary artery systolic pressure. The estimated right ventricular systolic pressure is 76.7 mmHg.  3. Left atrial size was moderately dilated.  4. Right atrial size was mildly dilated.  5. The mitral valve is normal in structure. Mild mitral valve regurgitation. No evidence of mitral stenosis.  6. The aortic valve is tricuspid. There is mild calcification of the aortic valve. Aortic valve regurgitation is moderate.  7. Aortic dilatation noted. There is severe dilatation of the ascending aorta, measuring 52 mm.  8. The inferior vena cava is dilated in size with <50% respiratory variability, suggesting right atrial pressure of 15 mmHg.  9. A small pericardial effusion is present. FINDINGS  Left Ventricle: Left ventricular ejection fraction, by estimation, is 50%. The left ventricle has mildly decreased function. The left ventricle demonstrates global hypokinesis. Definity contrast agent was given IV to delineate the left ventricular endocardial borders. The left ventricular internal cavity size was normal in size. There is mild concentric left ventricular hypertrophy. Left ventricular diastolic parameters are consistent with Grade I diastolic dysfunction (impaired relaxation). Right Ventricle: The right ventricular size is normal. No increase in right ventricular wall thickness. Right ventricular systolic function is normal. There is moderately elevated pulmonary artery systolic pressure. The tricuspid regurgitant velocity is 2.99 m/s, and with an assumed right atrial pressure of 15 mmHg, the estimated right ventricular systolic pressure is 34.1 mmHg. Left Atrium: Left atrial size was moderately dilated. Right Atrium: Right atrial size was mildly dilated. Pericardium: A small pericardial effusion is present.  Mitral Valve: The mitral valve is normal in structure. Mild mitral valve regurgitation. No evidence of  mitral valve stenosis. Tricuspid Valve: The tricuspid valve is normal in structure. Tricuspid valve regurgitation is mild. Aortic Valve: The aortic valve is tricuspid. There is mild calcification of the aortic valve. Aortic valve regurgitation is moderate. Aortic regurgitation PHT measures 842 msec. Pulmonic Valve: The pulmonic valve was normal in structure. Pulmonic valve regurgitation is trivial. Aorta: Aortic dilatation noted. There is severe dilatation of the ascending aorta, measuring 52 mm. Venous: The inferior vena cava is dilated in size with less than 50% respiratory variability, suggesting right atrial pressure of 15 mmHg. IAS/Shunts: No atrial level shunt detected by color flow Doppler.  LEFT VENTRICLE PLAX 2D LVIDd:         5.60 cm     Diastology LVIDs:         3.20 cm     LV e' medial:    3.23 cm/s LV PW:         1.00 cm     LV E/e' medial:  13.0 LV IVS:        0.90 cm     LV e' lateral:   3.38 cm/s LVOT diam:     2.00 cm     LV E/e' lateral: 12.4 LVOT Area:     3.14 cm  LV Volumes (MOD) LV vol d, MOD A2C: 85.5 ml LV vol d, MOD A4C: 91.5 ml LV vol s, MOD A2C: 48.3 ml LV vol s, MOD A4C: 48.2 ml LV SV MOD A2C:     37.2 ml LV SV MOD A4C:     91.5 ml LV SV MOD BP:      41.3 ml RIGHT VENTRICLE RV Basal diam:  4.70 cm RV Mid diam:    2.40 cm RV S prime:     15.20 cm/s TAPSE (M-mode): 2.0 cm LEFT ATRIUM             Index        RIGHT ATRIUM           Index LA diam:        3.20 cm 1.99 cm/m   RA Area:     18.80 cm LA Vol (A2C):   90.4 ml 56.22 ml/m  RA Volume:   48.00 ml  29.85 ml/m LA Vol (A4C):   39.3 ml 24.44 ml/m LA Biplane Vol: 59.8 ml 37.19 ml/m  AORTIC VALVE AI PHT:      842 msec  AORTA Ao Root diam: 3.10 cm Ao STJ diam:  3.1 cm Ao Asc diam:  4.90 cm MITRAL VALVE               TRICUSPID VALVE MV Area (PHT): 1.94 cm    TR Peak grad:   35.8 mmHg MV Decel Time: 391 msec    TR Vmax:        299.00  cm/s MV E velocity: 42.00 cm/s MV A velocity: 84.80 cm/s  SHUNTS MV E/A ratio:  0.50        Systemic Diam: 2.00 cm Dalton McleanMD Electronically signed by Franki Monte Signature Date/Time: 07/04/2022/10:12:09 AM    Final (Updated)    DG Knee Complete 4 Views Right  Result Date: 07/03/2022 CLINICAL DATA:  Right hip fracture EXAM: RIGHT KNEE - COMPLETE 4+ VIEW COMPARISON:  None FINDINGS: Anterior soft tissue swelling. Osteoarthritis most pronounced in the lateral compartment. No evidence of fracture. Question old healed fracture of the proximal fibula. IMPRESSION: No acute fracture. Mild soft tissue swelling. Osteoarthritis most pronounced in the lateral compartment. Question old healed fracture  of the proximal fibula. Electronically Signed   By: Nelson Chimes M.D.   On: 07/03/2022 17:16   DG Chest 1 View  Result Date: 07/03/2022 CLINICAL DATA:  Hip fracture EXAM: CHEST  1 VIEW COMPARISON:  12/24/2015 FINDINGS: Chronic cardiomegaly and aortic tortuosity. The lungs are clear. The vascularity is normal. No effusions. No visible rib fracture. IMPRESSION: Cardiomegaly and aortic atherosclerosis.  No active disease. Electronically Signed   By: Nelson Chimes M.D.   On: 07/03/2022 17:14   DG Hip Unilat With Pelvis 2-3 Views Right  Result Date: 07/03/2022 CLINICAL DATA:  Fall with pain EXAM: DG HIP (WITH OR WITHOUT PELVIS) 2-3V RIGHT COMPARISON:  None FINDINGS: Femoral neck fracture with mild displacement. No intertrochanteric component. Right hemipelvis is negative. IMPRESSION: Displaced femoral neck fracture on the right. Electronically Signed   By: Nelson Chimes M.D.   On: 07/03/2022 17:14    Assessment/Plan ***   Family/ staff Communication: Discussed plan of care with resident and charge nurse  Labs/tests ordered:     Durenda Age, DNP, MSN, FNP-BC [facility] [phone] (Monday-Friday 8:00 a.m. - 5:00 p.m.) [phone] (after hours)

## 2022-07-23 ENCOUNTER — Observation Stay (HOSPITAL_COMMUNITY)
Admission: EM | Admit: 2022-07-23 | Discharge: 2022-07-24 | Disposition: A | Discharge status: Skilled Nursing Facility | Payer: PPO | Attending: Internal Medicine | Admitting: Internal Medicine

## 2022-07-23 ENCOUNTER — Other Ambulatory Visit: Payer: Self-pay

## 2022-07-23 ENCOUNTER — Encounter: Payer: Self-pay | Admitting: Nurse Practitioner

## 2022-07-23 ENCOUNTER — Encounter (HOSPITAL_COMMUNITY): Payer: Self-pay

## 2022-07-23 ENCOUNTER — Emergency Department (HOSPITAL_COMMUNITY): Payer: PPO

## 2022-07-23 DIAGNOSIS — I4891 Unspecified atrial fibrillation: Secondary | ICD-10-CM | POA: Diagnosis not present

## 2022-07-23 DIAGNOSIS — I2693 Single subsegmental pulmonary embolism without acute cor pulmonale: Secondary | ICD-10-CM | POA: Diagnosis not present

## 2022-07-23 DIAGNOSIS — I82409 Acute embolism and thrombosis of unspecified deep veins of unspecified lower extremity: Secondary | ICD-10-CM | POA: Insufficient documentation

## 2022-07-23 DIAGNOSIS — I2510 Atherosclerotic heart disease of native coronary artery without angina pectoris: Secondary | ICD-10-CM | POA: Insufficient documentation

## 2022-07-23 DIAGNOSIS — I252 Old myocardial infarction: Secondary | ICD-10-CM | POA: Insufficient documentation

## 2022-07-23 DIAGNOSIS — Z7901 Long term (current) use of anticoagulants: Secondary | ICD-10-CM | POA: Diagnosis not present

## 2022-07-23 DIAGNOSIS — Z79899 Other long term (current) drug therapy: Secondary | ICD-10-CM | POA: Diagnosis not present

## 2022-07-23 DIAGNOSIS — Z86718 Personal history of other venous thrombosis and embolism: Secondary | ICD-10-CM | POA: Insufficient documentation

## 2022-07-23 DIAGNOSIS — D62 Acute posthemorrhagic anemia: Secondary | ICD-10-CM | POA: Insufficient documentation

## 2022-07-23 DIAGNOSIS — I480 Paroxysmal atrial fibrillation: Secondary | ICD-10-CM | POA: Diagnosis not present

## 2022-07-23 DIAGNOSIS — I2699 Other pulmonary embolism without acute cor pulmonale: Secondary | ICD-10-CM

## 2022-07-23 DIAGNOSIS — I491 Atrial premature depolarization: Secondary | ICD-10-CM | POA: Diagnosis not present

## 2022-07-23 DIAGNOSIS — R0602 Shortness of breath: Secondary | ICD-10-CM | POA: Diagnosis not present

## 2022-07-23 DIAGNOSIS — I479 Paroxysmal tachycardia, unspecified: Secondary | ICD-10-CM | POA: Insufficient documentation

## 2022-07-23 DIAGNOSIS — K449 Diaphragmatic hernia without obstruction or gangrene: Secondary | ICD-10-CM | POA: Diagnosis not present

## 2022-07-23 DIAGNOSIS — R000 Tachycardia, unspecified: Secondary | ICD-10-CM

## 2022-07-23 DIAGNOSIS — Z96641 Presence of right artificial hip joint: Secondary | ICD-10-CM | POA: Diagnosis not present

## 2022-07-23 DIAGNOSIS — R0689 Other abnormalities of breathing: Secondary | ICD-10-CM | POA: Diagnosis not present

## 2022-07-23 DIAGNOSIS — I471 Supraventricular tachycardia: Secondary | ICD-10-CM

## 2022-07-23 DIAGNOSIS — N1832 Chronic kidney disease, stage 3b: Secondary | ICD-10-CM | POA: Insufficient documentation

## 2022-07-23 DIAGNOSIS — R609 Edema, unspecified: Secondary | ICD-10-CM | POA: Insufficient documentation

## 2022-07-23 DIAGNOSIS — J9811 Atelectasis: Secondary | ICD-10-CM | POA: Diagnosis not present

## 2022-07-23 DIAGNOSIS — R0600 Dyspnea, unspecified: Secondary | ICD-10-CM | POA: Diagnosis not present

## 2022-07-23 DIAGNOSIS — Z955 Presence of coronary angioplasty implant and graft: Secondary | ICD-10-CM | POA: Insufficient documentation

## 2022-07-23 DIAGNOSIS — I7121 Aneurysm of the ascending aorta, without rupture: Secondary | ICD-10-CM | POA: Diagnosis not present

## 2022-07-23 DIAGNOSIS — R0902 Hypoxemia: Secondary | ICD-10-CM | POA: Diagnosis not present

## 2022-07-23 DIAGNOSIS — I129 Hypertensive chronic kidney disease with stage 1 through stage 4 chronic kidney disease, or unspecified chronic kidney disease: Secondary | ICD-10-CM | POA: Insufficient documentation

## 2022-07-23 DIAGNOSIS — J90 Pleural effusion, not elsewhere classified: Secondary | ICD-10-CM | POA: Diagnosis not present

## 2022-07-23 LAB — IRON AND TIBC
Iron: 47 ug/dL (ref 28–170)
Saturation Ratios: 20 % (ref 10.4–31.8)
TIBC: 232 ug/dL — ABNORMAL LOW (ref 250–450)
UIBC: 185 ug/dL

## 2022-07-23 LAB — CBC WITH DIFFERENTIAL/PLATELET
Abs Immature Granulocytes: 0.03 10*3/uL (ref 0.00–0.07)
Basophils Absolute: 0 10*3/uL (ref 0.0–0.1)
Basophils Relative: 0 %
Eosinophils Absolute: 0 10*3/uL (ref 0.0–0.5)
Eosinophils Relative: 0 %
HCT: 30.6 % — ABNORMAL LOW (ref 36.0–46.0)
Hemoglobin: 9.9 g/dL — ABNORMAL LOW (ref 12.0–15.0)
Immature Granulocytes: 0 %
Lymphocytes Relative: 10 %
Lymphs Abs: 0.7 10*3/uL (ref 0.7–4.0)
MCH: 29.9 pg (ref 26.0–34.0)
MCHC: 32.4 g/dL (ref 30.0–36.0)
MCV: 92.4 fL (ref 80.0–100.0)
Monocytes Absolute: 0.4 10*3/uL (ref 0.1–1.0)
Monocytes Relative: 5 %
Neutro Abs: 6.1 10*3/uL (ref 1.7–7.7)
Neutrophils Relative %: 85 %
Platelets: 357 10*3/uL (ref 150–400)
RBC: 3.31 MIL/uL — ABNORMAL LOW (ref 3.87–5.11)
RDW: 14.9 % (ref 11.5–15.5)
WBC: 7.3 10*3/uL (ref 4.0–10.5)
nRBC: 0 % (ref 0.0–0.2)

## 2022-07-23 LAB — MAGNESIUM: Magnesium: 1.9 mg/dL (ref 1.7–2.4)

## 2022-07-23 LAB — BASIC METABOLIC PANEL
Anion gap: 9 (ref 5–15)
BUN: 17 mg/dL (ref 8–23)
CO2: 21 mmol/L — ABNORMAL LOW (ref 22–32)
Calcium: 8.5 mg/dL — ABNORMAL LOW (ref 8.9–10.3)
Chloride: 111 mmol/L (ref 98–111)
Creatinine, Ser: 1.12 mg/dL — ABNORMAL HIGH (ref 0.44–1.00)
GFR, Estimated: 47 mL/min — ABNORMAL LOW (ref >60)
Glucose, Bld: 88 mg/dL (ref 70–99)
Potassium: 4.8 mmol/L (ref 3.5–5.1)
Sodium: 141 mmol/L (ref 135–145)

## 2022-07-23 LAB — TROPONIN I (HIGH SENSITIVITY)
Troponin I (High Sensitivity): 105 ng/L (ref <18)
Troponin I (High Sensitivity): 108 ng/L (ref <18)

## 2022-07-23 LAB — APTT: aPTT: 66 seconds — ABNORMAL HIGH (ref 24–36)

## 2022-07-23 LAB — TSH: TSH: 0.434 u[IU]/mL (ref 0.350–4.500)

## 2022-07-23 LAB — T4, FREE: Free T4: 1.35 ng/dL — ABNORMAL HIGH (ref 0.61–1.12)

## 2022-07-23 LAB — HEPARIN LEVEL (UNFRACTIONATED): Heparin Unfractionated: >1.1 IU/mL — ABNORMAL HIGH (ref 0.30–0.70)

## 2022-07-23 LAB — FERRITIN: Ferritin: 87 ng/mL (ref 11–307)

## 2022-07-23 LAB — BRAIN NATRIURETIC PEPTIDE: B Natriuretic Peptide: 486.2 pg/mL — ABNORMAL HIGH (ref 0.0–100.0)

## 2022-07-23 MED ORDER — HEPARIN (PORCINE) 25000 UT/250ML-% IV SOLN
1000.0000 [IU]/h | INTRAVENOUS | Status: DC
  Administered 2022-07-23: 900 [IU]/h via INTRAVENOUS
  Filled 2022-07-23: qty 250

## 2022-07-23 MED ORDER — APIXABAN (ELIQUIS) VTE STARTER PACK (10MG AND 5MG): ORAL_TABLET | ORAL | 0 refills | Status: DC

## 2022-07-23 MED ORDER — METOPROLOL TARTRATE 25 MG PO TABS
37.5000 mg | ORAL_TABLET | Freq: Two times a day (BID) | ORAL | Status: DC
  Administered 2022-07-24: 37.5 mg via ORAL
  Filled 2022-07-23 (×2): qty 2

## 2022-07-23 MED ORDER — METOCLOPRAMIDE HCL 10 MG PO TABS
5.0000 mg | ORAL_TABLET | Freq: Two times a day (BID) | ORAL | Status: DC
  Administered 2022-07-23 – 2022-07-24 (×3): 5 mg via ORAL
  Filled 2022-07-23 (×3): qty 1

## 2022-07-23 MED ORDER — ACETAMINOPHEN 325 MG PO TABS: 650.0000 mg | ORAL_TABLET | ORAL | Status: DC | PRN

## 2022-07-23 MED ORDER — CARBIDOPA-LEVODOPA 25-100 MG PO TABS
1.0000 | ORAL_TABLET | Freq: Three times a day (TID) | ORAL | Status: DC
  Administered 2022-07-23 – 2022-07-24 (×3): 1 via ORAL
  Filled 2022-07-23 (×3): qty 1

## 2022-07-23 MED ORDER — FERROUS SULFATE 325 (65 FE) MG PO TABS
325.0000 mg | ORAL_TABLET | Freq: Every day | ORAL | Status: DC
  Administered 2022-07-24: 325 mg via ORAL
  Filled 2022-07-23: qty 1

## 2022-07-23 MED ORDER — PANTOPRAZOLE SODIUM 20 MG PO TBEC
20.0000 mg | DELAYED_RELEASE_TABLET | Freq: Every day | ORAL | Status: DC
  Administered 2022-07-23 – 2022-07-24 (×2): 20 mg via ORAL
  Filled 2022-07-23 (×2): qty 1

## 2022-07-23 MED ORDER — ACETAMINOPHEN 325 MG PO TABS
650.0000 mg | ORAL_TABLET | Freq: Three times a day (TID) | ORAL | Status: DC
  Administered 2022-07-23 – 2022-07-24 (×3): 650 mg via ORAL
  Filled 2022-07-23 (×3): qty 2

## 2022-07-23 MED ORDER — MELATONIN 3 MG PO TABS: 3.0000 mg | ORAL_TABLET | Freq: Every day | ORAL | Status: DC | PRN

## 2022-07-23 MED ORDER — HYDROCODONE-ACETAMINOPHEN 5-325 MG PO TABS: 1.0000 | ORAL_TABLET | Freq: Four times a day (QID) | ORAL | Status: DC | PRN

## 2022-07-23 MED ORDER — SODIUM CHLORIDE 0.9 % IV SOLN: 250.0000 mL | INTRAVENOUS | Status: DC | PRN

## 2022-07-23 MED ORDER — SODIUM CHLORIDE 0.9% FLUSH
3.0000 mL | Freq: Two times a day (BID) | INTRAVENOUS | Status: DC
Start: 2022-07-23 — End: 2022-07-24
  Administered 2022-07-23: 3 mL via INTRAVENOUS

## 2022-07-23 MED ORDER — METOPROLOL TARTRATE 25 MG PO TABS
50.0000 mg | ORAL_TABLET | Freq: Once | ORAL | Status: AC
  Administered 2022-07-23: 50 mg via ORAL
  Filled 2022-07-23: qty 2

## 2022-07-23 MED ORDER — ATORVASTATIN CALCIUM 10 MG PO TABS
10.0000 mg | ORAL_TABLET | Freq: Every day | ORAL | Status: DC
  Administered 2022-07-23: 10 mg via ORAL
  Filled 2022-07-23: qty 1

## 2022-07-23 MED ORDER — ONDANSETRON HCL 4 MG/2ML IJ SOLN: 4.0000 mg | Freq: Four times a day (QID) | INTRAMUSCULAR | Status: DC | PRN

## 2022-07-23 MED ORDER — SODIUM CHLORIDE 0.9% FLUSH: 3.0000 mL | INTRAVENOUS | Status: DC | PRN

## 2022-07-23 MED ORDER — POLYETHYLENE GLYCOL 3350 17 G PO PACK
17.0000 g | PACK | Freq: Every day | ORAL | Status: DC
  Administered 2022-07-24: 17 g via ORAL
  Filled 2022-07-23 (×2): qty 1

## 2022-07-23 MED ORDER — METOPROLOL TARTRATE 5 MG/5ML IV SOLN: 5.0000 mg | Freq: Once | INTRAVENOUS | Status: DC

## 2022-07-23 MED ORDER — METHOCARBAMOL 500 MG PO TABS: 250.0000 mg | ORAL_TABLET | Freq: Every evening | ORAL | Status: DC | PRN

## 2022-07-23 MED ORDER — DOCUSATE SODIUM 100 MG PO CAPS
100.0000 mg | ORAL_CAPSULE | Freq: Two times a day (BID) | ORAL | Status: DC
  Administered 2022-07-23 – 2022-07-24 (×3): 100 mg via ORAL
  Filled 2022-07-23 (×3): qty 1

## 2022-07-23 MED ORDER — IOHEXOL 350 MG/ML SOLN
100.0000 mL | Freq: Once | INTRAVENOUS | Status: AC | PRN
  Administered 2022-07-23: 100 mL via INTRAVENOUS

## 2022-07-23 MED ORDER — METOPROLOL TARTRATE 5 MG/5ML IV SOLN
2.5000 mg | INTRAVENOUS | Status: DC | PRN
Start: 2022-07-23 — End: 2022-07-24

## 2022-07-23 MED ORDER — HEPARIN BOLUS VIA INFUSION
1000.0000 [IU] | Freq: Once | INTRAVENOUS | Status: AC
  Administered 2022-07-23: 1000 [IU] via INTRAVENOUS
  Filled 2022-07-23: qty 1000

## 2022-07-23 NOTE — ED Notes (Signed)
Received verbal report from Kuch Y RN at this time 

## 2022-07-23 NOTE — ED Provider Notes (Signed)
[facility] EMERGENCY DEPARTMENT Provider Note   CSN: [id number] Arrival date & time: 07/23/22  6433     History  Chief Complaint  Patient presents with  . Tachycardia    Taylor Mendoza is a 88 y.o. female.  With PMH of HLD, HTN, CAD status post stent, recent DVT on Eliquis presenting from [facility] complaining of shortness of breath found to be in narrow rhythm regular tachycardia ranging from 1 50-1 80. Given 800 cc IVF from EMS.  Patient is here because she is complaining of increasing shortness of breath since yesterday.  She complains of dyspnea on exertion, orthopnea and slight dyspnea at rest but that improves with resting.  She was recently diagnosed with a right-sided DVT of her lower extremity and has been on Eliquis for the past 3 to 4 days not missing any doses.  She has not had any fevers, cough, URI symptoms, chest pain, bleeding.  HPI     Home Medications Prior to Admission medications   Medication Sig Start Date End Date Taking? Authorizing Provider  acetaminophen (TYLENOL) 325 MG tablet Take 650 mg by mouth 3 (three) times daily.   Yes [provider]  apixaban (ELIQUIS) 5 MG TABS tablet Take 10 mg by mouth 2 (two) times daily. 07/19/22 07/26/22 Yes [provider]  atorvastatin (LIPITOR) 10 MG tablet Take 10 mg by mouth at bedtime. 04/25/12  Yes [provider]  carbidopa-levodopa (SINEMET IR) 25-100 MG tablet Take 1 tablet by mouth 3 (three) times daily. 02/27/22  Yes Tat, Eustace Quail, DO  docusate sodium (COLACE) 100 MG capsule Take 1 capsule (100 mg total) by mouth 2 (two) times daily. 07/08/22  Yes Aline August, MD  furosemide (LASIX) 20 MG tablet Take 20 mg by mouth daily as needed for fluid. 04/26/22  Yes [provider]  HYDROcodone-acetaminophen (NORCO/VICODIN) 5-325 MG tablet Take 1 tablet by mouth every 6 (six) hours as needed for moderate pain. 07/06/22  Yes Mcarthur Rossetti, MD  melatonin 1 MG TABS  tablet Take 2 mg by mouth daily as needed (sleep).   Yes [provider]  methocarbamol (ROBAXIN) 500 MG tablet Take 250 mg by mouth at bedtime as needed for muscle spasms. Avoid Norco use only for moderate pain.   Yes [provider]  metoCLOPramide (REGLAN) 5 MG tablet Take 5 mg by mouth 2 (two) times daily. 07/09/22 08/06/22 Yes [provider]  metoprolol succinate (TOPROL-XL) 50 MG 24 hr tablet Take 50 mg by mouth daily. 09/12/16  Yes [provider]  nitroGLYCERIN (NITROSTAT) 0.4 MG SL tablet Place 1 tablet (0.4 mg total) under the tongue every 5 (five) minutes as needed for chest pain. 06/12/18 07/23/22 Yes Dorothy Spark, MD  pantoprazole (PROTONIX) 20 MG tablet Take 20 mg by mouth daily. 05/28/22  Yes [provider]  polyethylene glycol (MIRALAX / GLYCOLAX) 17 g packet Take 17 g by mouth daily. 07/08/22  Yes Aline August, MD  APIXABAN Arne Cleveland) VTE STARTER PACK ('10MG'$  AND '5MG'$ ) Take as directed on package: start with two-'5mg'$  tablets twice daily for 7 days. On day 8, switch to one-'5mg'$  tablet twice daily. Patient not taking: Reported on 07/23/2022 07/19/22   Medina-Vargas, Monina C, NP      Allergies    Amlodipine, Alendronate, and Ramipril    Review of Systems   Review of Systems  Physical Exam Updated Vital Signs BP (!) 159/55   Pulse (!) 53   Temp 98.1 F (36.7 C)  Resp (!) 28   SpO2 98%  Physical Exam Constitutional: Alert and oriented. Well appearing and in no distress. Eyes: Conjunctivae are normal. ENT      Head: Normocephalic and atraumatic.      Nose: No congestion.      Mouth/Throat: Mucous membranes are moist.      Neck: No stridor. Cardiovascular: S1, S2, tachycardic, warm and well-perfused Respiratory: Tachypnea high 20s low 30s satting 98% on room air, clear breath sounds Gastrointestinal: Soft and nontender.  Musculoskeletal: Normal range of motion in all extremities.      Right lower leg: RLE>LLE nontender      Left  lower leg: No tenderness or edema. Neurologic: Normal speech and language. No gross focal neurologic deficits are appreciated. Skin: Skin is warm, dry and intact. No rash noted. Psychiatric: Mood and affect are normal. Speech and behavior are normal.  ED Results / Procedures / Treatments   Labs (all labs ordered are listed, but only abnormal results are displayed) Labs Reviewed  BASIC METABOLIC PANEL - Abnormal; Notable for the following components:      Result Value   CO2 21 (*)    Creatinine, Ser 1.12 (*)    Calcium 8.5 (*)    GFR, Estimated 47 (*)    All other components within normal limits  CBC WITH DIFFERENTIAL/PLATELET - Abnormal; Notable for the following components:   RBC 3.31 (*)    Hemoglobin 9.9 (*)    HCT 30.6 (*)    All other components within normal limits  BRAIN NATRIURETIC PEPTIDE - Abnormal; Notable for the following components:   B Natriuretic Peptide 486.2 (*)    All other components within normal limits  T4, FREE - Abnormal; Notable for the following components:   Free T4 1.35 (*)    All other components within normal limits  MAGNESIUM  TSH  HEPARIN LEVEL (UNFRACTIONATED)  APTT  IRON AND TIBC  FERRITIN  TROPONIN I (HIGH SENSITIVITY)    EKG EKG Interpretation  Date/Time:  Tuesday July 23 2022 10:51:42 EDT Ventricular Rate:  61 PR Interval:  218 QRS Duration: 79 QT Interval:  356 QTC Calculation: 359 R Axis:   -31 Text Interpretation: Sinus rhythm Atrial premature complexes Borderline prolonged PR interval Left axis deviation Anterior infarct, old Confirmed by Georgina Snell [phone]) on 07/23/2022 10:56:09 AM  Radiology CT Angio Chest PE W and/or Wo Contrast  Result Date: 07/23/2022 CLINICAL DATA:  Shortness of breath. EXAM: CT ANGIOGRAPHY CHEST WITH CONTRAST TECHNIQUE: Multidetector CT imaging of the chest was performed using the standard protocol during bolus administration of intravenous contrast. Multiplanar CT image reconstructions and  MIPs were obtained to evaluate the vascular anatomy. RADIATION DOSE REDUCTION: This exam was performed according to the departmental dose-optimization program which includes automated exposure control, adjustment of the mA and/or kV according to patient size and/or use of iterative reconstruction technique. CONTRAST:  100 mL Omnipaque 350 intravenously. COMPARISON:  June 01, 2015. FINDINGS: Cardiovascular: Small filling defect is seen in lower lobe branch of right pulmonary artery consistent with small pulmonary embolus. 5.4 cm ascending thoracic aortic aneurysm is noted. Mild cardiomegaly is noted. No pericardial effusion is noted. Mediastinum/Nodes: Moderate size hiatal hernia is noted. Thyroid gland is unremarkable. No adenopathy is noted. Lungs/Pleura: No pneumothorax is noted. Small left pleural effusion is noted with minimal adjacent subsegmental atelectasis. Upper Abdomen: No acute abnormality. Musculoskeletal: Old lower thoracic compression fracture is noted. No acute osseous abnormality is noted. Review of the MIP images confirms the above findings.  IMPRESSION: Small peripheral pulmonary embolus is noted in lower lobe branch of right pulmonary artery. Critical Value/emergent results were called by telephone at the time of interpretation on 07/23/2022 at 1:44 pm to provider Georgina Snell , who verbally acknowledged these results. 5.4 cm ascending thoracic aortic aneurysm. Recommend semi-annual imaging followup by CTA or MRA and referral to cardiothoracic surgery if not already obtained. This recommendation follows 2010 ACCF/AHA/AATS/ACR/ASA/SCA/SCAI/SIR/STS/SVM Guidelines for the Diagnosis and Management of Patients With Thoracic Aortic Disease. Circulation. 2010; 121: Z610-R60. Aortic aneurysm NOS (ICD10-I71.9). Small left pleural effusion is noted with minimal adjacent subsegmental atelectasis. Moderate size hiatal hernia is noted. Aortic Atherosclerosis (ICD10-I70.0). Electronically Signed   By: Marijo Conception M.D.   On: 07/23/2022 13:44   DG Chest Portable 1 View  Result Date: 07/23/2022 CLINICAL DATA:  Dyspnea. EXAM: PORTABLE CHEST 1 VIEW COMPARISON:  Chest radiograph 07/03/2022 FINDINGS: Stable enlargement of the cardiac silhouette. Hazy densities at the left lung base. Otherwise, the lungs are clear. Negative for a pneumothorax. No acute bone abnormality. IMPRESSION: No acute chest findings. Mild haziness at the left lung base probably represents overlying structures. Electronically Signed   By: Markus Daft M.D.   On: 07/23/2022 11:11    Procedures .Critical Care  Performed by: Elgie Congo, MD Authorized by: Elgie Congo, MD   Critical care provider statement:    Critical care time (minutes):  30   Critical care was necessary to treat or prevent imminent or life-threatening deterioration of the following conditions: PE on heparin gtt.   Critical care was time spent personally by me on the following activities:  Development of treatment plan with patient or surrogate, discussions with consultants, evaluation of patient's response to treatment, examination of patient, ordering and review of laboratory studies, ordering and review of radiographic studies, ordering and performing treatments and interventions, pulse oximetry, re-evaluation of patient's condition, review of old charts and obtaining history from patient or surrogate   Care discussed with: admitting provider     Patient remained on constant cardiac monitoring, initial narrow rhythm tachycardia followed by normal sinus rhythm with PACs   Medications Ordered in ED Medications  heparin ADULT infusion 100 units/mL (25000 units/23m) (900 Units/hr Intravenous New Bag/Given 07/23/22 1546)  acetaminophen (TYLENOL) tablet 650 mg (650 mg Oral Given 07/23/22 1538)  HYDROcodone-acetaminophen (NORCO/VICODIN) 5-325 MG per tablet 1 tablet (has no administration in time range)  atorvastatin (LIPITOR) tablet 10 mg (has no  administration in time range)  metoprolol tartrate (LOPRESSOR) tablet 37.5 mg (0 mg Oral Hold 07/23/22 1542)  docusate sodium (COLACE) capsule 100 mg (100 mg Oral Given 07/23/22 1538)  metoCLOPramide (REGLAN) tablet 5 mg (5 mg Oral Given 07/23/22 1542)  pantoprazole (PROTONIX) EC tablet 20 mg (20 mg Oral Given 07/23/22 1538)  polyethylene glycol (MIRALAX / GLYCOLAX) packet 17 g (17 g Oral Patient Refused/Not Given 07/23/22 1544)  melatonin tablet 3 mg (has no administration in time range)  carbidopa-levodopa (SINEMET IR) 25-100 MG per tablet immediate release 1 tablet (has no administration in time range)  methocarbamol (ROBAXIN) tablet 250 mg (has no administration in time range)  sodium chloride flush (NS) 0.9 % injection 3 mL (3 mLs Intravenous Given 07/23/22 1544)  sodium chloride flush (NS) 0.9 % injection 3 mL (has no administration in time range)  0.9 %  sodium chloride infusion (has no administration in time range)  acetaminophen (TYLENOL) tablet 650 mg (has no administration in time range)  ondansetron (ZOFRAN) injection 4 mg (has no administration in  time range)  metoprolol tartrate (LOPRESSOR) injection 2.5 mg (has no administration in time range)  ferrous sulfate tablet 325 mg (has no administration in time range)  metoprolol tartrate (LOPRESSOR) tablet 50 mg (50 mg Oral Given 07/23/22 1121)  iohexol (OMNIPAQUE) 350 MG/ML injection 100 mL (100 mLs Intravenous Contrast Given 07/23/22 1332)  heparin bolus via infusion 1,000 Units (1,000 Units Intravenous Bolus from Bag 07/23/22 1547)    ED Course/ Medical Decision Making/ A&P Clinical Course as of 07/23/22 1559  Tue Jul 23, 2022  1054 Prior to patient receiving oral metoprolol or IV metoprolol, converted spontaneously to sinus rhythm with PACs and first degree block.  She has left axis deviation with no obvious ischemic change.  Holding off from troponins with no chest pain or obvious ischemic changes as I do not think underlying ACS  contributed to event. [VB]  3825 Chest x-ray with haziness overlying left lower lobe. [VB]  0539 Patient's labs with creatinine 1.12 at baseline.  Normal sodium 141 potassium 4.8 with normal magnesium 1.9.  She has a stable anemia hemoglobin 9.9.  No leukocytosis.  Elevated BNP 486 with known history of 50% EF from recent hospitalization.  No clinical signs of fluid overload. Slightly elevated free T4 TSH pending, not consistent with thyrotoxicosis. [VB]  [id number] Patient with evidence of distal PE despite being on Eliquis. No heart strain. Ordered for heparin drip. Also evidence of large thoracic aneurysm but asymptomatic. Paging hospitalist for admission. [VB]  1555 Patient admitted to hospitalist on heparin gtt, [VB]    Clinical Course User Index [VB] Elgie Congo, MD                           Medical Decision Making  Taylor Mendoza is a 88 y.o. female.  With PMH of HLD, HTN, CAD status post stent, recent DVT on Eliquis presenting from [facility] complaining of shortness of breath found to be in narrow rhythm regular tachycardia ranging from 1 50-1 80. Given 800 cc IVF from EMS.  She presents tachycardic with narrow rhythm tachycardia ranging from 140-150 with stable blood pressure, no increased work of breathing, normal mentation and no hypoxia.  Patient recently discharged from the hospital after having femoral neck fracture.  She was also recently diagnosed with a DVT of the right lower extremity started on Eliquis.  On her recent admission she was found to be in a junctional tachycardia and has previously had monitoring that shows intermittent PVCs and PACs.  She presents today in a narrow rhythm tachycardia that will intermittently drop to the 80s with multiple PVCs and PACs and then immediately return to narrow tachycardia but stable with normal BP, no hypoxia, normal mentation.  Based on her EKG favor atrial flutter versus fibrillation and will trial IV metoprolol with p.o.  metoprolol.  She does not appear clinically fluid overloaded, less likely due to heart failure despite associated shortness of breath but will obtain BNP and chest x-ray to further evaluate.  Recent echocardiogram during hospitalization showed EF 50% as noted below.  More concern for possible underlying PE or acute electrolyte abnormality contributing to narrow tachycardia especially in the setting of recent DVT, will obtain CTA chest PE protocol.  She is denying any chest pain and there is no obvious ischemic change on EKG, less likely due to underlying ACS.     Amount and/or Complexity of Data Reviewed External Data Reviewed: radiology and notes.    Details: Most  recent Echo July 2023  1. Left ventricular ejection fraction, by estimation, is 50%. The left  ventricle has mildly decreased function. The left ventricle demonstrates  global hypokinesis. There is mild concentric left ventricular hypertrophy.  Left ventricular diastolic  parameters are consistent with Grade I diastolic dysfunction (impaired  relaxation).   2. Right ventricular systolic function is normal. The right ventricular  size is normal. There is moderately elevated pulmonary artery systolic  pressure Labs: ordered. Decision-making details documented in ED Course. Radiology: ordered and independent interpretation performed. Decision-making details documented in ED Course.    Details: Chest x-ray with haziness overlying left lower lobe  Risk Prescription drug management. Decision regarding hospitalization.    Final Clinical Impression(s) / ED Diagnoses Final diagnoses:  Tachycardia  Single subsegmental pulmonary embolism without acute cor pulmonale [facility])    Rx / DC Orders ED Discharge Orders     None         Elgie Congo, MD 07/23/22 1559

## 2022-07-23 NOTE — Consult Note (Signed)
Cardiology Consultation:   Patient ID: Taylor Mendoza MRN: [id number]; DOB: January 28, 1930  Admit date: 07/23/2022 Date of Consult: 07/23/2022  PCP:  [name], MD   [facility] Providers Cardiologist:  Sherren Mocha, MD        Patient Profile:   Taylor Mendoza is a 88 y.o. female with a hx of CAD who is being seen 07/23/2022 for the evaluation of SVT at the request of Dr Roosevelt Locks.  History of Present Illness:   Taylor Mendoza is well-known to me from the outpatient setting.  She is a delightful 88 year old woman with history of coronary artery disease and remote myocardial infarction, stage IIIb chronic kidney disease, recent right hip surgery status post right hemiarthroplasty, and recently diagnosed leg DVT.  The patient presents today with heart palpitations and lightheadedness.  She developed fairly sudden onset of weakness, palpitations, and dizziness this morning.  The patient has been started on apixaban for treatment of postoperative DVT.  She had no chest pain or pressure.  EMS was called to her facility and the patient was noted to have a heart rate in the 140's with relatively low systolic blood pressure in the 90s.  While in the emergency department, her rhythm broke and she reverted to sinus rhythm spontaneously.  She has remained in sinus rhythm throughout her ER course and is now hospitalized after a CT angiogram demonstrated small segmental pulmonary embolus.  The patient is evaluated now in the emergency department.  Her daughters are at bedside.  She is feeling better back in sinus rhythm.  She denies chest pain or shortness of breath at rest.  She has noticed that her right leg has been swollen since surgery.  No orthopnea or PND.  So far she is tolerating anticoagulation and is currently on IV heparin.   Past Medical History:  Diagnosis Date   Coronary artery disease    Methicillin resistant Staphylococcus aureus in conditions classified elsewhere and of unspecified site     MI (myocardial infarction) ([place])    Other and unspecified hyperlipidemia    Unspecified essential hypertension     Past Surgical History:  Procedure Laterality Date   CATARACT EXTRACTION, BILATERAL     CORONARY ANGIOPLASTY WITH STENT PLACEMENT     HIP ARTHROPLASTY Right 07/04/2022   Procedure: RIGHT ANTERIOR HIP REPLACEMENT ;  Surgeon: Mcarthur Rossetti, MD;  Location: [facility];  Service: Orthopedics;  Laterality: Right;     Home Medications:  Prior to Admission medications   Medication Sig Start Date End Date Taking? Authorizing Provider  acetaminophen (TYLENOL) 325 MG tablet Take 650 mg by mouth 3 (three) times daily.   Yes [provider]  apixaban (ELIQUIS) 5 MG TABS tablet Take 10 mg by mouth 2 (two) times daily. 07/19/22 07/26/22 Yes [provider]  atorvastatin (LIPITOR) 10 MG tablet Take 10 mg by mouth at bedtime. 04/25/12  Yes [provider]  carbidopa-levodopa (SINEMET IR) 25-100 MG tablet Take 1 tablet by mouth 3 (three) times daily. 02/27/22  Yes Tat, Eustace Quail, DO  docusate sodium (COLACE) 100 MG capsule Take 1 capsule (100 mg total) by mouth 2 (two) times daily. 07/08/22  Yes Aline August, MD  furosemide (LASIX) 20 MG tablet Take 20 mg by mouth daily as needed for fluid. 04/26/22  Yes [provider]  HYDROcodone-acetaminophen (NORCO/VICODIN) 5-325 MG tablet Take 1 tablet by mouth every 6 (six) hours as needed for moderate pain. 07/06/22  Yes Mcarthur Rossetti, MD  melatonin 1 MG  TABS tablet Take 2 mg by mouth daily as needed (sleep).   Yes [provider]  methocarbamol (ROBAXIN) 500 MG tablet Take 250 mg by mouth at bedtime as needed for muscle spasms. Avoid Norco use only for moderate pain.   Yes [provider]  metoCLOPramide (REGLAN) 5 MG tablet Take 5 mg by mouth 2 (two) times daily. 07/09/22 08/06/22 Yes [provider]  metoprolol succinate (TOPROL-XL) 50 MG 24 hr tablet Take 50 mg by mouth daily.  09/12/16  Yes [provider]  nitroGLYCERIN (NITROSTAT) 0.4 MG SL tablet Place 1 tablet (0.4 mg total) under the tongue every 5 (five) minutes as needed for chest pain. 06/12/18 07/23/22 Yes Dorothy Spark, MD  pantoprazole (PROTONIX) 20 MG tablet Take 20 mg by mouth daily. 05/28/22  Yes [provider]  polyethylene glycol (MIRALAX / GLYCOLAX) 17 g packet Take 17 g by mouth daily. 07/08/22  Yes Aline August, MD  APIXABAN Arne [place]) VTE STARTER PACK ('10MG'$  AND '5MG'$ ) Take as directed on package: start with two-'5mg'$  tablets twice daily for 7 days. On day 8, switch to one-'5mg'$  tablet twice daily. Patient not taking: Reported on 07/23/2022 07/19/22   Medina-Vargas, Senaida Lange, NP    Inpatient Medications: Scheduled Meds:  acetaminophen  650 mg Oral TID   atorvastatin  10 mg Oral QHS   carbidopa-levodopa  1 tablet Oral TID   docusate sodium  100 mg Oral BID   [START ON 07/24/2022] ferrous sulfate  325 mg Oral Q breakfast   metoCLOPramide  5 mg Oral BID   metoprolol tartrate  37.5 mg Oral BID   pantoprazole  20 mg Oral Daily   polyethylene glycol  17 g Oral Daily   sodium chloride flush  3 mL Intravenous Q12H   Continuous Infusions:  sodium chloride     heparin 900 Units/hr (07/23/22 1546)   PRN Meds: sodium chloride, acetaminophen, HYDROcodone-acetaminophen, melatonin, methocarbamol, metoprolol tartrate, ondansetron (ZOFRAN) IV, sodium chloride flush  Allergies:    Allergies  Allergen Reactions   Amlodipine Anaphylaxis, Swelling and Other (See Comments)    Edema   Alendronate Other (See Comments)    Dysphagia   Ramipril Cough    Social History:   Social History   Socioeconomic History   Marital status: Married    Spouse name: Not on file   Number of children: Not on file   Years of education: Not on file   Highest education level: Not on file  Occupational History   Not on file  Tobacco Use   Smoking status: Never   Smokeless tobacco: Never  Vaping Use    Vaping Use: Never used  Substance and Sexual Activity   Alcohol use: No   Drug use: No   Sexual activity: Not on file  Other Topics Concern   Not on file  Social History Narrative   Right handed    [facility]   Social Determinants of Health   Financial Resource Strain: Not on file  Food Insecurity: Not on file  Transportation Needs: Not on file  Physical Activity: Not on file  Stress: Not on file  Social Connections: Not on file  Intimate Partner Violence: Not on file    Family History:   Family History  Problem Relation Age of Onset   Osteoporosis Mother    Skin cancer Mother    Stroke Father    Stroke Brother      ROS:  Please see the history of present illness.  All other  ROS reviewed and negative.     Physical Exam/Data:   Vitals:   07/23/22 1542 07/23/22 1600 07/23/22 1630 07/23/22 1645  BP: (!) 159/55 (!) 147/50 (!) 157/56 (!) 156/62  Pulse: (!) 53 60 (!) 57 (!) 52  Resp:  (!) 27 (!) 28 (!) 23  Temp:      TempSrc:      SpO2:  98% 99% 98%   No intake or output data in the 24 hours ending 07/23/22 1710    07/19/2022    2:40 PM 07/16/2022    4:10 PM 07/09/2022    3:02 PM  Last 3 Weights  Weight (lbs) 125 lb 119 lb 3.2 oz 120 lb 4.8 oz  Weight (kg) 56.7 kg 54.069 kg 54.568 kg     There is no height or weight on file to calculate BMI.  General:  Well nourished, well developed, in no acute distress HEENT: normal Neck: no JVD Vascular: No carotid bruits; Distal pulses 2+ bilaterally Cardiac: Bradycardic and regular with grade 2/6 diastolic decrescendo murmur at the left lower sternal border Lungs:  clear to auscultation bilaterally, no wheezing, rhonchi or rales  Abd: soft, nontender, no hepatomegaly  Ext: 1+ right lower extremity edema, no edema on the left Musculoskeletal:  No deformities, BUE and BLE strength normal and equal Skin: warm and dry  Neuro:  CNs 2-12 intact, no focal abnormalities noted Psych:  Normal affect   EKG:  The EKG was  personally reviewed and demonstrates:   EKG at 10:51 AM today shows sinus rhythm with premature atrial contractions, possible old anteroseptal infarct.  Prior EKG shows narrow complex tachycardia likely atrial tachycardia with diffuse ST abnormality  Telemetry:  Telemetry was personally reviewed and demonstrates: Sinus rhythm with PACs  Relevant CV Studies: Recent echo reviewed with LVEF 50%, mild global hypokinesis, normal RV function, moderately elevated PA systolic pressure of 51 mmHg, biatrial enlargement, moderate aortic insufficiency, severe dilatation of the ascending aorta 52 mm  Laboratory Data:  High Sensitivity Troponin:   Recent Labs  Lab 07/03/22 2123 07/23/22 1510  TROPONINIHS 30* 105*     Chemistry Recent Labs  Lab 07/23/22 1118  NA 141  K 4.8  CL 111  CO2 21*  GLUCOSE 88  BUN 17  CREATININE 1.12*  CALCIUM 8.5*  MG 1.9  GFRNONAA 47*  ANIONGAP 9    No results for input(s): "PROT", "ALBUMIN", "AST", "ALT", "ALKPHOS", "BILITOT" in the last 168 hours. Lipids No results for input(s): "CHOL", "TRIG", "HDL", "LABVLDL", "LDLCALC", "CHOLHDL" in the last 168 hours.  Hematology Recent Labs  Lab 07/23/22 1118  WBC 7.3  RBC 3.31*  HGB 9.9*  HCT 30.6*  MCV 92.4  MCH 29.9  MCHC 32.4  RDW 14.9  PLT 357   Thyroid  Recent Labs  Lab 07/23/22 1118  TSH 0.434  FREET4 1.35*    BNP Recent Labs  Lab 07/23/22 1118  BNP 486.2*    DDimer No results for input(s): "DDIMER" in the last 168 hours.   Radiology/Studies:  CT Angio Chest PE W and/or Wo Contrast  Result Date: 07/23/2022 CLINICAL DATA:  Shortness of breath. EXAM: CT ANGIOGRAPHY CHEST WITH CONTRAST TECHNIQUE: Multidetector CT imaging of the chest was performed using the standard protocol during bolus administration of intravenous contrast. Multiplanar CT image reconstructions and MIPs were obtained to evaluate the vascular anatomy. RADIATION DOSE REDUCTION: This exam was performed according to the  departmental dose-optimization program which includes automated exposure control, adjustment of the mA and/or kV according  to patient size and/or use of iterative reconstruction technique. CONTRAST:  100 mL Omnipaque 350 intravenously. COMPARISON:  June 01, 2015. FINDINGS: Cardiovascular: Small filling defect is seen in lower lobe branch of right pulmonary artery consistent with small pulmonary embolus. 5.4 cm ascending thoracic aortic aneurysm is noted. Mild cardiomegaly is noted. No pericardial effusion is noted. Mediastinum/Nodes: Moderate size hiatal hernia is noted. Thyroid gland is unremarkable. No adenopathy is noted. Lungs/Pleura: No pneumothorax is noted. Small left pleural effusion is noted with minimal adjacent subsegmental atelectasis. Upper Abdomen: No acute abnormality. Musculoskeletal: Old lower thoracic compression fracture is noted. No acute osseous abnormality is noted. Review of the MIP images confirms the above findings. IMPRESSION: Small peripheral pulmonary embolus is noted in lower lobe branch of right pulmonary artery. Critical Value/emergent results were called by telephone at the time of interpretation on 07/23/2022 at 1:44 pm to provider Georgina Snell , who verbally acknowledged these results. 5.4 cm ascending thoracic aortic aneurysm. Recommend semi-annual imaging followup by CTA or MRA and referral to cardiothoracic surgery if not already obtained. This recommendation follows 2010 ACCF/AHA/AATS/ACR/ASA/SCA/SCAI/SIR/STS/SVM Guidelines for the Diagnosis and Management of Patients With Thoracic Aortic Disease. Circulation. 2010; 121: Z610-R60. Aortic aneurysm NOS (ICD10-I71.9). Small left pleural effusion is noted with minimal adjacent subsegmental atelectasis. Moderate size hiatal hernia is noted. Aortic Atherosclerosis (ICD10-I70.0). Electronically Signed   By: Marijo Conception M.D.   On: 07/23/2022 13:44   DG Chest Portable 1 View  Result Date: 07/23/2022 CLINICAL DATA:  Dyspnea.  EXAM: PORTABLE CHEST 1 VIEW COMPARISON:  Chest radiograph 07/03/2022 FINDINGS: Stable enlargement of the cardiac silhouette. Hazy densities at the left lung base. Otherwise, the lungs are clear. Negative for a pneumothorax. No acute bone abnormality. IMPRESSION: No acute chest findings. Mild haziness at the left lung base probably represents overlying structures. Electronically Signed   By: Markus Daft M.D.   On: 07/23/2022 11:11     Assessment and Plan:   Paroxysmal SVT Heart failure with mildly reduced ejection fraction DVT/PE CAD status post PCI with no active ischemic symptoms Moderate aortic valve insufficiency  The patient's recent echocardiogram is reviewed.  I also reviewed her EKG and telemetry tracings.  Her EKG is suggestive of a narrow complex tachycardia, most likely atrial tachycardia.  I do not think this is atrial fibrillation because of the regularity of the rhythm.  I cannot appreciate any flutter waves, although the heart rate could be suggestive of atrial flutter.  The patient is appropriately anticoagulated for her recent DVT and now diagnosed with PE.  Recommend continue her beta-blocker at the current dose, okay to transition from heparin back to apixaban from my perspective.  Will observe on telemetry overnight.  If heart rhythm is stable I will likely place a ZIO monitor at discharge for continued monitoring of her heart rhythm.  It is appropriate to treat her valvular heart disease medically.  She is having no ischemic symptoms and otherwise appears stable from a cardiac perspective.   Risk Assessment/Risk Scores:                For questions or updates, please contact [place] Please consult [email or web address] for contact info under    Signed, Sherren Mocha, MD  07/23/2022 5:10 PM

## 2022-07-23 NOTE — ED Triage Notes (Signed)
[facility] [place] c/o hypotension and lethargic and a new rate of 150-180. Per EMS they attempted vagal with no success. Pt received 800 ml NS.

## 2022-07-23 NOTE — Progress Notes (Signed)
[place] for heparin Indication: small peripheral PE, hx recent DVT  Heparin Dosing Weight: 56.7 kg  Labs: Recent Labs    07/23/22 1118 07/23/22 2058  HGB 9.9*  --   HCT 30.6*  --   PLT 357  --   APTT  --  66*  HEPARINUNFRC  --  >1.10*  CREATININE 1.12*  --      Assessment: 39 yof with recent femoral neck fracture, s/p hip replacement 7/27 and also recent acute RLE DVT (diagnosed at [facility] outpatient), started on apixaban. Patient presents today with tachycardia and SOB, found to have small peripheral PE on CTA. Pharmacy consulted to transition apixaban to heparin drip. Last dose of apixaban was 8/14 at 2218 per patient (currently taking '10mg'$  PO BID, started on 8/11). Hg 9.9, plt WNL, SCr at baseline. No bleed issues reported.  Initial heparin level >1.1 as expected, aPTT low end of goal at 66 seconds.  Goal of Therapy:  Heparin level 0.3-0.7 units/ml aPTT 66-102 seconds Monitor platelets by anticoagulation protocol: Yes   Plan:  Increase heparin to 1000 units/h Daily aPTT/HL  Arrie Senate, PharmD, BCPS, [facility] Clinical Pharmacist Please check AMION for all [facility] numbers 07/23/2022

## 2022-07-23 NOTE — Progress Notes (Signed)
[place] for heparin Indication: small peripheral PE, hx recent DVT  Heparin Dosing Weight: 56.7 kg  Labs: Recent Labs    07/23/22 1118  HGB 9.9*  HCT 30.6*  PLT 357  CREATININE 1.12*    Assessment: 39 yof with recent femoral neck fracture, s/p hip replacement 7/27 and also recent acute RLE DVT (diagnosed at [facility] outpatient), started on apixaban. Patient presents today with tachycardia and SOB, found to have small peripheral PE on CTA. Pharmacy consulted to transition apixaban to heparin drip. Last dose of apixaban was 8/14 at 2218 per patient (currently taking '10mg'$  PO BID, started on 8/11). Hg 9.9, plt WNL, SCr at baseline. No bleed issues reported.  Goal of Therapy:  Heparin level 0.3-0.7 units/ml aPTT 66-102 seconds Monitor platelets by anticoagulation protocol: Yes   Plan:  Heparin 1000 unit bolus x 1 - new PE with symptoms but very conservative smaller bolus due recent apixaban last night and advanced age Start heparin at 900 units/hr Check 6hr heparin level Monitor daily CBC, s/sx bleeding   Arturo Morton, PharmD, BCPS Clinical Pharmacist 07/23/2022 2:17 PM

## 2022-07-23 NOTE — H&P (Signed)
History and Physical    Taylor Mendoza HEN:[id number] DOB: 31-Dec-1929 DOA: 07/23/2022  PCP: [name], MD (Confirm with patient/family/NH records and if not entered, this has to be entered at [facility] point of entry) Patient coming from: SNF  I have personally briefly reviewed patient's old medical records in [place]  Chief Complaint: Palpitations and lightheadedness  HPI: Taylor Mendoza is a 88 y.o. female with medical history significant of CAD s/p stenting, CKD stage IIIb, recent right hip surgery status post right hemiarthroplasty, acute DVT diagnosed 3 days ago, acute blood loss anemia, presented with palpitations and near syncope.  Patient was found to have acute DVT despite on outpatient DVT prophylaxis after right hip surgery on 7/27.  Patient was started on Eliquis 3 days ago, so far reported no problems with Eliquis no active bleeding.  This morning, was found pale and cold and clammy on toilet seat.  Heart rate in 140s blood pressure SBP 90s.  No hypoxia.  EMS called.  Initial EKG strip showed SVT versus A-fib.  ED Course: SVT/A-fib broke to normal sinus rhythm, blood pressure improved.  CT angiogram shows small PE on the left peripheral lungs.  Review of Systems: As per HPI otherwise 14 point review of systems negative.    Past Medical History:  Diagnosis Date   Coronary artery disease    Methicillin resistant Staphylococcus aureus in conditions classified elsewhere and of unspecified site    MI (myocardial infarction) ([place])    Other and unspecified hyperlipidemia    Unspecified essential hypertension     Past Surgical History:  Procedure Laterality Date   CATARACT EXTRACTION, BILATERAL     CORONARY ANGIOPLASTY WITH STENT PLACEMENT     HIP ARTHROPLASTY Right 07/04/2022   Procedure: RIGHT ANTERIOR HIP REPLACEMENT ;  Surgeon: Mcarthur Rossetti, MD;  Location: [facility];  Service: Orthopedics;  Laterality: Right;     reports that she has never smoked. She has  never used smokeless tobacco. She reports that she does not drink alcohol and does not use drugs.  Allergies  Allergen Reactions   Amlodipine Anaphylaxis, Swelling and Other (See Comments)    Edema   Alendronate Other (See Comments)    Dysphagia   Ramipril Cough    Family History  Problem Relation Age of Onset   Osteoporosis Mother    Skin cancer Mother    Stroke Father    Stroke Brother      Prior to Admission medications   Medication Sig Start Date End Date Taking? Authorizing Provider  acetaminophen (TYLENOL) 325 MG tablet Take 650 mg by mouth 3 (three) times daily.   Yes [provider]  apixaban (ELIQUIS) 5 MG TABS tablet Take 10 mg by mouth 2 (two) times daily. 07/19/22 07/26/22 Yes [provider]  atorvastatin (LIPITOR) 10 MG tablet Take 10 mg by mouth at bedtime. 04/25/12  Yes [provider]  carbidopa-levodopa (SINEMET IR) 25-100 MG tablet Take 1 tablet by mouth 3 (three) times daily. 02/27/22  Yes Tat, Eustace Quail, DO  docusate sodium (COLACE) 100 MG capsule Take 1 capsule (100 mg total) by mouth 2 (two) times daily. 07/08/22  Yes Aline August, MD  furosemide (LASIX) 20 MG tablet Take 20 mg by mouth daily as needed for fluid. 04/26/22  Yes [provider]  HYDROcodone-acetaminophen (NORCO/VICODIN) 5-325 MG tablet Take 1 tablet by mouth every 6 (six) hours as needed for moderate pain. 07/06/22  Yes Mcarthur Rossetti, MD  melatonin 1 MG TABS  tablet Take 2 mg by mouth daily as needed (sleep).   Yes [provider]  methocarbamol (ROBAXIN) 500 MG tablet Take 250 mg by mouth at bedtime as needed for muscle spasms. Avoid Norco use only for moderate pain.   Yes [provider]  metoCLOPramide (REGLAN) 5 MG tablet Take 5 mg by mouth 2 (two) times daily. 07/09/22 08/06/22 Yes [provider]  metoprolol succinate (TOPROL-XL) 50 MG 24 hr tablet Take 50 mg by mouth daily. 09/12/16  Yes [provider]  nitroGLYCERIN  (NITROSTAT) 0.4 MG SL tablet Place 1 tablet (0.4 mg total) under the tongue every 5 (five) minutes as needed for chest pain. 06/12/18 07/23/22 Yes Dorothy Spark, MD  pantoprazole (PROTONIX) 20 MG tablet Take 20 mg by mouth daily. 05/28/22  Yes [provider]  polyethylene glycol (MIRALAX / GLYCOLAX) 17 g packet Take 17 g by mouth daily. 07/08/22  Yes Aline August, MD  APIXABAN Arne [place]) VTE STARTER PACK ('10MG'$  AND '5MG'$ ) Take as directed on package: start with two-'5mg'$  tablets twice daily for 7 days. On day 8, switch to one-'5mg'$  tablet twice daily. Patient not taking: Reported on 07/23/2022 07/19/22   Nickola Major, NP    Physical Exam: Vitals:   07/23/22 1345 07/23/22 1400 07/23/22 1415 07/23/22 1500  BP: 115/63 (!) 127/57 (!) 153/60 (!) 142/59  Pulse: (!) 53 (!) 50 (!) 59 61  Resp: (!) 22 15 (!) 24 (!) 28  Temp: 98.1 F (36.7 C)     TempSrc:      SpO2: 98% 98% 99% 98%    Constitutional: NAD, calm, comfortable Vitals:   07/23/22 1345 07/23/22 1400 07/23/22 1415 07/23/22 1500  BP: 115/63 (!) 127/57 (!) 153/60 (!) 142/59  Pulse: (!) 53 (!) 50 (!) 59 61  Resp: (!) 22 15 (!) 24 (!) 28  Temp: 98.1 F (36.7 C)     TempSrc:      SpO2: 98% 98% 99% 98%   Eyes: PERRL, lids and conjunctivae normal ENMT: Mucous membranes are moist. Posterior pharynx clear of any exudate or lesions.Normal dentition.  Neck: normal, supple, no masses, no thyromegaly Respiratory: clear to auscultation bilaterally, no wheezing, no crackles. Normal respiratory effort. No accessory muscle use.  Cardiovascular: Regular rate and rhythm, no murmurs / rubs / gallops. No extremity edema. 2+ pedal pulses. No carotid bruits.  Abdomen: no tenderness, no masses palpated. No hepatosplenomegaly. Bowel sounds positive.  Musculoskeletal: no clubbing / cyanosis. No joint deformity upper and lower extremities. Good ROM, no contractures. Normal muscle tone.  Skin: no rashes, lesions, ulcers. No  induration Neurologic: CN 2-12 grossly intact. Sensation intact, DTR normal. Strength 5/5 in all 4.  Psychiatric: Normal judgment and insight. Alert and oriented x 3. Normal mood.    Labs on Admission: I have personally reviewed following labs and imaging studies  CBC: Recent Labs  Lab 07/23/22 1118  WBC 7.3  NEUTROABS 6.1  HGB 9.9*  HCT 30.6*  MCV 92.4  PLT 616   Basic Metabolic Panel: Recent Labs  Lab 07/23/22 1118  NA 141  K 4.8  CL 111  CO2 21*  GLUCOSE 88  BUN 17  CREATININE 1.12*  CALCIUM 8.5*  MG 1.9   GFR: Estimated Creatinine Clearance: 30.5 mL/min (A) (by C-G formula based on SCr of 1.12 mg/dL (H)). Liver Function Tests: No results for input(s): "AST", "ALT", "ALKPHOS", "BILITOT", "PROT", "ALBUMIN" in the last 168 hours. No results for input(s): "LIPASE", "AMYLASE" in the last 168 hours. No results  for input(s): "AMMONIA" in the last 168 hours. Coagulation Profile: No results for input(s): "INR", "PROTIME" in the last 168 hours. Cardiac Enzymes: No results for input(s): "CKTOTAL", "CKMB", "CKMBINDEX", "TROPONINI" in the last 168 hours. BNP (last 3 results) No results for input(s): "PROBNP" in the last 8760 hours. HbA1C: No results for input(s): "HGBA1C" in the last 72 hours. CBG: No results for input(s): "GLUCAP" in the last 168 hours. Lipid Profile: No results for input(s): "CHOL", "HDL", "LDLCALC", "TRIG", "CHOLHDL", "LDLDIRECT" in the last 72 hours. Thyroid Function Tests: Recent Labs    07/23/22 1118  TSH 0.434  FREET4 1.35*   Anemia Panel: No results for input(s): "VITAMINB12", "FOLATE", "FERRITIN", "TIBC", "IRON", "RETICCTPCT" in the last 72 hours. Urine analysis:    Component Value Date/Time   COLORURINE YELLOW 07/04/2022 1314   APPEARANCEUR HAZY (A) 07/04/2022 1314   LABSPEC 1.019 07/04/2022 1314   PHURINE 5.0 07/04/2022 1314   GLUCOSEU NEGATIVE 07/04/2022 1314   HGBUR NEGATIVE 07/04/2022 1314   BILIRUBINUR NEGATIVE 07/04/2022 1314    KETONESUR NEGATIVE 07/04/2022 1314   PROTEINUR NEGATIVE 07/04/2022 1314   UROBILINOGEN 0.2 03/14/2009 0930   NITRITE NEGATIVE 07/04/2022 1314   LEUKOCYTESUR NEGATIVE 07/04/2022 1314    Radiological Exams on Admission: CT Angio Chest PE W and/or Wo Contrast  Result Date: 07/23/2022 CLINICAL DATA:  Shortness of breath. EXAM: CT ANGIOGRAPHY CHEST WITH CONTRAST TECHNIQUE: Multidetector CT imaging of the chest was performed using the standard protocol during bolus administration of intravenous contrast. Multiplanar CT image reconstructions and MIPs were obtained to evaluate the vascular anatomy. RADIATION DOSE REDUCTION: This exam was performed according to the departmental dose-optimization program which includes automated exposure control, adjustment of the mA and/or kV according to patient size and/or use of iterative reconstruction technique. CONTRAST:  100 mL Omnipaque 350 intravenously. COMPARISON:  June 01, 2015. FINDINGS: Cardiovascular: Small filling defect is seen in lower lobe branch of right pulmonary artery consistent with small pulmonary embolus. 5.4 cm ascending thoracic aortic aneurysm is noted. Mild cardiomegaly is noted. No pericardial effusion is noted. Mediastinum/Nodes: Moderate size hiatal hernia is noted. Thyroid gland is unremarkable. No adenopathy is noted. Lungs/Pleura: No pneumothorax is noted. Small left pleural effusion is noted with minimal adjacent subsegmental atelectasis. Upper Abdomen: No acute abnormality. Musculoskeletal: Old lower thoracic compression fracture is noted. No acute osseous abnormality is noted. Review of the MIP images confirms the above findings. IMPRESSION: Small peripheral pulmonary embolus is noted in lower lobe branch of right pulmonary artery. Critical Value/emergent results were called by telephone at the time of interpretation on 07/23/2022 at 1:44 pm to provider Georgina Snell , who verbally acknowledged these results. 5.4 cm ascending thoracic  aortic aneurysm. Recommend semi-annual imaging followup by CTA or MRA and referral to cardiothoracic surgery if not already obtained. This recommendation follows 2010 ACCF/AHA/AATS/ACR/ASA/SCA/SCAI/SIR/STS/SVM Guidelines for the Diagnosis and Management of Patients With Thoracic Aortic Disease. Circulation. 2010; 121: M546-T03. Aortic aneurysm NOS (ICD10-I71.9). Small left pleural effusion is noted with minimal adjacent subsegmental atelectasis. Moderate size hiatal hernia is noted. Aortic Atherosclerosis (ICD10-I70.0). Electronically Signed   By: Marijo Conception M.D.   On: 07/23/2022 13:44   DG Chest Portable 1 View  Result Date: 07/23/2022 CLINICAL DATA:  Dyspnea. EXAM: PORTABLE CHEST 1 VIEW COMPARISON:  Chest radiograph 07/03/2022 FINDINGS: Stable enlargement of the cardiac silhouette. Hazy densities at the left lung base. Otherwise, the lungs are clear. Negative for a pneumothorax. No acute bone abnormality. IMPRESSION: No acute chest findings. Mild haziness at the left lung  base probably represents overlying structures. Electronically Signed   By: Markus Daft M.D.   On: 07/23/2022 11:11    EKG: Independently reviewed.  SVT versus A-fib  Assessment/Plan Principal Problem:   A-fib [facility]) Active Problems:   Acute pulmonary embolism ([place])  (please populate well all problems here in Problem List. (For example, if patient is on BP meds at home and you resume or decide to hold them, it is a problem that needs to be her. Same for CAD, COPD, HLD and so on)  Paroxysmal SVT versus PAF -Given patient's age and other comorbidities, recommend rate control.  Family risk concern about recurrent arrhythmia, consult cardiology to help [place] manage this case. Gerald Stabs for metoprolol from 25 mg twice daily to 37.5 mg twice daily -On heparin drip for now  Acute PE -Small peripheral left-sided PE, doubt this much of clot burden because patient's symptoms/arrhythmia.  Discussed with  cardiology. -Echocardiogram -Already diagnosed DVT 3 days ago  Stable acute blood loss anemia -Related to recent hip fracture and hip surgery.  Start iron supplement.  Outpatient follow-up with PCP.  HTN -Controlled, increase metoprolol as above  CKD stage IIIb -Euvolemic, blood pressure controlled.  History of CAD -EKG showed nonischemic, troponin pending.  Right femoral neck fracture status post hip replacement -No acute concerns  DVT prophylaxis: Heparin drip Code Status: DNR Family Communication: Daughter at the bedside Disposition Plan: Expect less than 2 midnight hospital stay Consults called: Cardiology Admission status: Telemetry observation   Lequita Halt MD [facility] Pager [phone]  07/23/2022, 3:37 PM

## 2022-07-24 ENCOUNTER — Observation Stay (HOSPITAL_COMMUNITY): Payer: PPO

## 2022-07-24 ENCOUNTER — Encounter: Payer: Self-pay | Admitting: *Deleted

## 2022-07-24 ENCOUNTER — Other Ambulatory Visit: Payer: Self-pay | Admitting: Cardiology

## 2022-07-24 ENCOUNTER — Observation Stay (INDEPENDENT_AMBULATORY_CARE_PROVIDER_SITE_OTHER): Payer: PPO

## 2022-07-24 DIAGNOSIS — I2699 Other pulmonary embolism without acute cor pulmonale: Secondary | ICD-10-CM

## 2022-07-24 DIAGNOSIS — R000 Tachycardia, unspecified: Secondary | ICD-10-CM

## 2022-07-24 DIAGNOSIS — I471 Supraventricular tachycardia: Secondary | ICD-10-CM | POA: Diagnosis not present

## 2022-07-24 LAB — HEPARIN LEVEL (UNFRACTIONATED): Heparin Unfractionated: >1.1 IU/mL — ABNORMAL HIGH (ref 0.30–0.70)

## 2022-07-24 LAB — BASIC METABOLIC PANEL
Anion gap: 8 (ref 5–15)
BUN: 18 mg/dL (ref 8–23)
CO2: 20 mmol/L — ABNORMAL LOW (ref 22–32)
Calcium: 8.4 mg/dL — ABNORMAL LOW (ref 8.9–10.3)
Chloride: 110 mmol/L (ref 98–111)
Creatinine, Ser: 1.12 mg/dL — ABNORMAL HIGH (ref 0.44–1.00)
GFR, Estimated: 47 mL/min — ABNORMAL LOW (ref >60)
Glucose, Bld: 79 mg/dL (ref 70–99)
Potassium: 4.1 mmol/L (ref 3.5–5.1)
Sodium: 138 mmol/L (ref 135–145)

## 2022-07-24 LAB — CBC
HCT: 32 % — ABNORMAL LOW (ref 36.0–46.0)
Hemoglobin: 9.5 g/dL — ABNORMAL LOW (ref 12.0–15.0)
MCH: 29.8 pg (ref 26.0–34.0)
MCHC: 29.7 g/dL — ABNORMAL LOW (ref 30.0–36.0)
MCV: 100.3 fL — ABNORMAL HIGH (ref 80.0–100.0)
Platelets: 293 10*3/uL (ref 150–400)
RBC: 3.19 MIL/uL — ABNORMAL LOW (ref 3.87–5.11)
RDW: 14.9 % (ref 11.5–15.5)
WBC: 4.6 10*3/uL (ref 4.0–10.5)
nRBC: 0 % (ref 0.0–0.2)

## 2022-07-24 LAB — APTT: aPTT: 91 seconds — ABNORMAL HIGH (ref 24–36)

## 2022-07-24 MED ORDER — METOPROLOL TARTRATE 37.5 MG PO TABS: 37.5000 mg | ORAL_TABLET | Freq: Two times a day (BID) | ORAL | 0 refills | Status: DC

## 2022-07-24 MED ORDER — APIXABAN 5 MG PO TABS
10.0000 mg | ORAL_TABLET | Freq: Two times a day (BID) | ORAL | Status: DC
  Administered 2022-07-24: 10 mg via ORAL
  Filled 2022-07-24: qty 2

## 2022-07-24 MED ORDER — FERROUS SULFATE 325 (65 FE) MG PO TABS: 325.0000 mg | ORAL_TABLET | Freq: Every day | ORAL | 0 refills | Status: DC

## 2022-07-24 MED ORDER — APIXABAN 5 MG PO TABS: 5.0000 mg | ORAL_TABLET | Freq: Two times a day (BID) | ORAL | Status: DC

## 2022-07-24 NOTE — Progress Notes (Unsigned)
Enrolled for [employer] to mail a ZIO XT long term holter monitor to the patients address on file.  Letter with instructions mailed to patient. 

## 2022-07-24 NOTE — Progress Notes (Signed)
ANTICOAGULATION CONSULT NOTE - Initial Consult  Pharmacy Consult for Eliquis Indication: pulmonary embolus and DVT  Allergies  Allergen Reactions   Amlodipine Anaphylaxis, Swelling and Other (See Comments)    Edema   Alendronate Other (See Comments)    Dysphagia   Ramipril Cough    Patient Measurements: Height: '5\' 6"'$  (167.6 cm) Weight: 56.7 kg (125 lb) IBW/kg (Calculated) : 59.3  Vital Signs: Temp: 98.2 F (36.8 C) (08/16 0830) Temp Source: Oral (08/16 0830) BP: 149/55 (08/16 0925) Pulse Rate: 65 (08/16 0925)  Labs: Recent Labs    07/23/22 1118 07/23/22 1510 07/23/22 1723 07/23/22 2058 07/24/22 0309  HGB 9.9*  --   --   --  9.5*  HCT 30.6*  --   --   --  32.0*  PLT 357  --   --   --  293  APTT  --   --   --  66* 91*  HEPARINUNFRC  --   --   --  >1.10* >1.10*  CREATININE 1.12*  --   --   --  1.12*  TROPONINIHS  --  105* 108*  --   --     Estimated Creatinine Clearance: 30.5 mL/min (A) (by C-G formula based on SCr of 1.12 mg/dL (H)).   Medical History: Past Medical History:  Diagnosis Date   Coronary artery disease    Methicillin resistant Staphylococcus aureus in conditions classified elsewhere and of unspecified site    MI (myocardial infarction) ([place])    Other and unspecified hyperlipidemia    Unspecified essential hypertension     Medications:  Scheduled:   acetaminophen  650 mg Oral TID   apixaban  10 mg Oral BID   Followed by   Derrill Memo ON 07/31/2022] apixaban  5 mg Oral BID   atorvastatin  10 mg Oral QHS   carbidopa-levodopa  1 tablet Oral TID   docusate sodium  100 mg Oral BID   ferrous sulfate  325 mg Oral Q breakfast   metoCLOPramide  5 mg Oral BID   metoprolol tartrate  37.5 mg Oral BID   pantoprazole  20 mg Oral Daily   polyethylene glycol  17 g Oral Daily   sodium chloride flush  3 mL Intravenous Q12H    Assessment: Patient with previously diagnosed DVT after a hip surgery presented with palpitations and lightheadedness, found to have  an acute small PE without heart strain. Cardiology consulted and does not suggest patient is in atrial fibrillation. Pharmacy consulted to transition from IV heparin to Eliquis for VTE treatment.  Goal of Therapy:  Monitor platelets by anticoagulation protocol: Yes   Plan:  Stop heparin continuous infusion Start Eliquis '10mg'$  BID x 7 days (Had previously started the higher dose, but with new PE and symptoms, will continue with full 7 days of higher dose) Transition to Eliquis '5mg'$  BID on 8/23 '@1000'$ . Pharmacy will sign off and follow peripherally for changes in therapy and safety. Monitor CBC and signs/symptoms of bleeding.  Erskine Speed, PharmD 07/24/2022,9:55 AM

## 2022-07-24 NOTE — Care Management Obs Status (Signed)
[facility] NOTIFICATION   Patient Details  Name: Taylor Mendoza MRN: [id number] Date of Birth: 11-Sep-1930   Medicare Observation Status Notification Given:  Yes    Fuller Mandril, RN 07/24/2022, 2:11 PM

## 2022-07-24 NOTE — Discharge Instructions (Signed)
Information on my medicine - ELIQUIS (apixaban)  This medication education was reviewed with me or my healthcare representative as part of my discharge preparation.   Why was Eliquis prescribed for you? Eliquis was prescribed to treat blood clots that may have been found in the veins of your legs (deep vein thrombosis) or in your lungs (pulmonary embolism) and to reduce the risk of them occurring again.  What do You need to know about Eliquis ? The starting dose is 10 mg (two 5 mg tablets) taken TWICE daily for the FIRST SEVEN (7) DAYS, then on 07/31/22 (morning)  the dose is reduced to ONE 5 mg tablet taken TWICE daily.  Eliquis may be taken with or without food.   Try to take the dose about the same time in the morning and in the evening. If you have difficulty swallowing the tablet whole please discuss with your pharmacist how to take the medication safely.  Take Eliquis exactly as prescribed and DO NOT stop taking Eliquis without talking to the doctor who prescribed the medication.  Stopping may increase your risk of developing a new blood clot.  Refill your prescription before you run out.  After discharge, you should have regular check-up appointments with your healthcare provider that is prescribing your Eliquis.    What do you do if you miss a dose? If a dose of ELIQUIS is not taken at the scheduled time, take it as soon as possible on the same day and twice-daily administration should be resumed. The dose should not be doubled to make up for a missed dose.  Important Safety Information A possible side effect of Eliquis is bleeding. You should call your healthcare provider right away if you experience any of the following: Bleeding from an injury or your nose that does not stop. Unusual colored urine (red or dark brown) or unusual colored stools (red or black). Unusual bruising for unknown reasons. A serious fall or if you hit your head (even if there is no bleeding).  Some  medicines may interact with Eliquis and might increase your risk of bleeding or clotting while on Eliquis. To help avoid this, consult your healthcare provider or pharmacist prior to using any new prescription or non-prescription medications, including herbals, vitamins, non-steroidal anti-inflammatory drugs (NSAIDs) and supplements.  This website has more information on Eliquis (apixaban): http://[email or web address].com/eliquis/home

## 2022-07-24 NOTE — Discharge Summary (Signed)
Physician Discharge Summary   Patient: Taylor Mendoza MRN: [id number] DOB: 07/20/1930  Admit date:     07/23/2022  Discharge date: 07/24/22  Discharge Physician: Alma Friendly   PCP: [name], MD   Recommendations at discharge:   Follow-up with PCP in 1 week Follow-up with cardiology as scheduled  Discharge Diagnoses: Principal Problem:   A-fib [facility]) Active Problems:   Acute pulmonary embolism [facility])    Hospital Course: Taylor Mendoza is a 88 y.o. female with medical history significant of CAD s/p stenting, CKD stage IIIb, recent right hip surgery status post right hemiarthroplasty, acute DVT diagnosed 3 days ago, acute blood loss anemia, presented with palpitations and near syncope. Patient was found to have acute DVT despite on outpatient DVT prophylaxis after right hip surgery on 7/27.  Patient was started on Eliquis 3 days ago, so far reported no problems with Eliquis no active bleeding. Pt was found pale and cold and clammy on toilet seat.  Heart rate in 140s blood pressure SBP 90s.  No hypoxia.  EMS called.  Initial EKG strip showed SVT versus A-fib. In the ED, SVT/A-fib broke to normal sinus rhythm, blood pressure improved.  CT angiogram shows small PE on the left peripheral lungs.  Cardiology consulted.   Today, patient reports feeling better, denies any new complaints.  Cardiology recommended discharge with placement of ZIO monitor on an outpatient basis.  Patient to discharge back to SNF    Assessment and Plan:  Paroxysmal atrial tachycardia Cardiology consulted, appreciate recs TSH WNL, free T4-->1.35, repeat at an outpatient Continue metoprolol 37.5 mg twice daily Continue Eliquis Plan for ZIO monitor at discharge  Acute PE Recent DVT S/p right hip surgery Noted small peripheral left-sided PE Continue Eliquis   Stable acute blood loss anemia Related to recent hip fracture and hip surgery D/C on iron supplementation  Outpatient follow-up with  PCP   HTN Stable, increase metoprolol as above   CKD stage IIIb Stable   History of CAD Chest pain free EKG showed nonischemic   Right femoral neck fracture status post hip replacement       Pain control - [place] Controlled Substance Reporting System database was reviewed. and patient was instructed, not to drive, operate heavy machinery, perform activities at heights, swimming or participation in water activities or provide baby-sitting services while on Pain, Sleep and Anxiety Medications; until their outpatient Physician has advised to do so again. Also recommended to not to take more than prescribed Pain, Sleep and Anxiety Medications.    Consultants: Cardiology Procedures performed: None Disposition: Skilled nursing facility Diet recommendation:  Cardiac diet DISCHARGE MEDICATION: Allergies as of 07/24/2022       Reactions   Amlodipine Anaphylaxis, Swelling, Other (See Comments)   Edema   Alendronate Other (See Comments)   Dysphagia   Ramipril Cough        Medication List     STOP taking these medications    metoprolol succinate 50 MG 24 hr tablet Commonly known as: TOPROL-XL       TAKE these medications    acetaminophen 325 MG tablet Commonly known as: TYLENOL Take 650 mg by mouth 3 (three) times daily.   Apixaban Starter Pack ('10mg'$  and '5mg'$ ) Commonly known as: ELIQUIS STARTER PACK Take as directed on package: start with two-'5mg'$  tablets twice daily for 7 days. On day 8, switch to one-'5mg'$  tablet twice daily.   apixaban 5 MG Tabs tablet Commonly known as: ELIQUIS Take 10 mg by mouth  2 (two) times daily.   atorvastatin 10 MG tablet Commonly known as: LIPITOR Take 10 mg by mouth at bedtime.   carbidopa-levodopa 25-100 MG tablet Commonly known as: SINEMET IR Take 1 tablet by mouth 3 (three) times daily.   docusate sodium 100 MG capsule Commonly known as: COLACE Take 1 capsule (100 mg total) by mouth 2 (two) times daily.   ferrous  sulfate 325 (65 FE) MG tablet Take 1 tablet (325 mg total) by mouth daily with breakfast. Start taking on: July 25, 2022   furosemide 20 MG tablet Commonly known as: LASIX Take 20 mg by mouth daily as needed for fluid.   HYDROcodone-acetaminophen 5-325 MG tablet Commonly known as: NORCO/VICODIN Take 1 tablet by mouth every 6 (six) hours as needed for moderate pain.   melatonin 1 MG Tabs tablet Take 2 mg by mouth daily as needed (sleep).   methocarbamol 500 MG tablet Commonly known as: ROBAXIN Take 250 mg by mouth at bedtime as needed for muscle spasms. Avoid Norco use only for moderate pain.   metoCLOPramide 5 MG tablet Commonly known as: REGLAN Take 5 mg by mouth 2 (two) times daily.   Metoprolol Tartrate 37.5 MG Tabs Take 37.5 mg by mouth 2 (two) times daily.   nitroGLYCERIN 0.4 MG SL tablet Commonly known as: NITROSTAT Place 1 tablet (0.4 mg total) under the tongue every 5 (five) minutes as needed for chest pain.   pantoprazole 20 MG tablet Commonly known as: PROTONIX Take 20 mg by mouth daily.   polyethylene glycol 17 g packet Commonly known as: MIRALAX / GLYCOLAX Take 17 g by mouth daily.        Follow-up Information     [name], MD. Schedule an appointment as soon as possible for a visit in 1 week(s).   Specialty: Internal Medicine Contact information: [address] [place] [address] [phone]         Sherren Mocha, MD .   Specialty: Cardiology Contact information: [phone] [address] [place] [address] [phone]                Discharge Exam: Taylor Mendoza Weights   07/23/22 2052  Weight: 56.7 kg   General: NAD  Cardiovascular: S1, S2 present Respiratory: CTAB Abdomen: Soft, nontender, nondistended, bowel sounds present Musculoskeletal: No bilateral pedal edema noted Skin: Normal Psychiatry: Normal mood   Condition at discharge: stable  The results of significant diagnostics from  this hospitalization (including imaging, microbiology, ancillary and laboratory) are listed below for reference.   Imaging Studies: CT Angio Chest PE W and/or Wo Contrast  Result Date: 07/23/2022 CLINICAL DATA:  Shortness of breath. EXAM: CT ANGIOGRAPHY CHEST WITH CONTRAST TECHNIQUE: Multidetector CT imaging of the chest was performed using the standard protocol during bolus administration of intravenous contrast. Multiplanar CT image reconstructions and MIPs were obtained to evaluate the vascular anatomy. RADIATION DOSE REDUCTION: This exam was performed according to the departmental dose-optimization program which includes automated exposure control, adjustment of the mA and/or kV according to patient size and/or use of iterative reconstruction technique. CONTRAST:  100 mL Omnipaque 350 intravenously. COMPARISON:  June 01, 2015. FINDINGS: Cardiovascular: Small filling defect is seen in lower lobe branch of right pulmonary artery consistent with small pulmonary embolus. 5.4 cm ascending thoracic aortic aneurysm is noted. Mild cardiomegaly is noted. No pericardial effusion is noted. Mediastinum/Nodes: Moderate size hiatal hernia is noted. Thyroid gland is unremarkable. No adenopathy is noted. Lungs/Pleura: No pneumothorax is noted. Small  left pleural effusion is noted with minimal adjacent subsegmental atelectasis. Upper Abdomen: No acute abnormality. Musculoskeletal: Old lower thoracic compression fracture is noted. No acute osseous abnormality is noted. Review of the MIP images confirms the above findings. IMPRESSION: Small peripheral pulmonary embolus is noted in lower lobe branch of right pulmonary artery. Critical Value/emergent results were called by telephone at the time of interpretation on 07/23/2022 at 1:44 pm to provider Georgina Snell , who verbally acknowledged these results. 5.4 cm ascending thoracic aortic aneurysm. Recommend semi-annual imaging followup by CTA or MRA and referral to  cardiothoracic surgery if not already obtained. This recommendation follows 2010 ACCF/AHA/AATS/ACR/ASA/SCA/SCAI/SIR/STS/SVM Guidelines for the Diagnosis and Management of Patients With Thoracic Aortic Disease. Circulation. 2010; 121: O350-K93. Aortic aneurysm NOS (ICD10-I71.9). Small left pleural effusion is noted with minimal adjacent subsegmental atelectasis. Moderate size hiatal hernia is noted. Aortic Atherosclerosis (ICD10-I70.0). Electronically Signed   By: Marijo Conception M.D.   On: 07/23/2022 13:44   DG Chest Portable 1 View  Result Date: 07/23/2022 CLINICAL DATA:  Dyspnea. EXAM: PORTABLE CHEST 1 VIEW COMPARISON:  Chest radiograph 07/03/2022 FINDINGS: Stable enlargement of the cardiac silhouette. Hazy densities at the left lung base. Otherwise, the lungs are clear. Negative for a pneumothorax. No acute bone abnormality. IMPRESSION: No acute chest findings. Mild haziness at the left lung base probably represents overlying structures. Electronically Signed   By: Markus Daft M.D.   On: 07/23/2022 11:11   Pelvis Portable  Result Date: 07/04/2022 CLINICAL DATA:  Postoperative hip replacement EXAM: PORTABLE PELVIS 1-2 VIEWS COMPARISON:  Pelvis and right hip radiographs 07/03/2022 FINDINGS: Interval total right hip arthroplasty. No perihardware lucency is seen to indicate hardware failure or loosening. Expected postoperative changes including subcutaneous air and soft tissue swelling about the right hip. Lateral right hip surgical skin staples. Mild pubic symphysis osteoarthritis. No acute fracture or dislocation. IMPRESSION: Interval total right hip arthroplasty without evidence of hardware failure. Electronically Signed   By: Yvonne Kendall M.D.   On: 07/04/2022 12:43   DG HIP UNILAT WITH PELVIS 1V RIGHT  Result Date: 07/04/2022 CLINICAL DATA:  Fluoroscopic assistance for right hip arthroplasty EXAM: DG HIP (WITH OR WITHOUT PELVIS) 1V RIGHT COMPARISON:  None Available. FINDINGS: Fluoroscopic images show  right hip arthroplasty. Fluoroscopy time 7 seconds. Radiation dose 0.8 mGy. IMPRESSION: Fluoroscopic assistance was provided for right hip arthroplasty. Electronically Signed   By: Elmer Picker M.D.   On: 07/04/2022 11:59   DG C-Arm 1-60 Min-No Report  Result Date: 07/04/2022 Fluoroscopy was utilized by the requesting physician.  No radiographic interpretation.   ECHOCARDIOGRAM COMPLETE  Result Date: 07/04/2022    ECHOCARDIOGRAM REPORT   Patient Name:   Taylor Mendoza Date of Exam: 07/04/2022 Medical Rec #:  [id number]       Height:       66.0 in Accession #:    [id number]      Weight:       119.7 lb Date of Birth:  [birth year]       BSA:          1.608 m Patient Age:    56 years        BP:           126/77 mmHg Patient Gender: F               HR:           46 bpm. Exam Location:  Inpatient Procedure: 2D Echo, Cardiac Doppler, Color Doppler and Intracardiac  Opacification Agent STAT ECHO Indications:     Abnormal ECG R94.31  History:         Patient has prior history of Echocardiogram examinations, most                  recent 10/04/2016. CAD and Previous Myocardial Infarction;                  Arrythmias:Bradycardia. Fractured hip.  Sonographer:     Darlina Sicilian RDCS Referring Phys:  Murray Diagnosing Phys: Kirk Ruths McleanMD IMPRESSIONS  1. Left ventricular ejection fraction, by estimation, is 50%. The left ventricle has mildly decreased function. The left ventricle demonstrates global hypokinesis. There is mild concentric left ventricular hypertrophy. Left ventricular diastolic parameters are consistent with Grade I diastolic dysfunction (impaired relaxation).  2. Right ventricular systolic function is normal. The right ventricular size is normal. There is moderately elevated pulmonary artery systolic pressure. The estimated right ventricular systolic pressure is 73.5 mmHg.  3. Left atrial size was moderately dilated.  4. Right atrial size was mildly dilated.  5. The mitral  valve is normal in structure. Mild mitral valve regurgitation. No evidence of mitral stenosis.  6. The aortic valve is tricuspid. There is mild calcification of the aortic valve. Aortic valve regurgitation is moderate.  7. Aortic dilatation noted. There is severe dilatation of the ascending aorta, measuring 52 mm.  8. The inferior vena cava is dilated in size with <50% respiratory variability, suggesting right atrial pressure of 15 mmHg.  9. A small pericardial effusion is present. FINDINGS  Left Ventricle: Left ventricular ejection fraction, by estimation, is 50%. The left ventricle has mildly decreased function. The left ventricle demonstrates global hypokinesis. Definity contrast agent was given IV to delineate the left ventricular endocardial borders. The left ventricular internal cavity size was normal in size. There is mild concentric left ventricular hypertrophy. Left ventricular diastolic parameters are consistent with Grade I diastolic dysfunction (impaired relaxation). Right Ventricle: The right ventricular size is normal. No increase in right ventricular wall thickness. Right ventricular systolic function is normal. There is moderately elevated pulmonary artery systolic pressure. The tricuspid regurgitant velocity is 2.99 m/s, and with an assumed right atrial pressure of 15 mmHg, the estimated right ventricular systolic pressure is 32.9 mmHg. Left Atrium: Left atrial size was moderately dilated. Right Atrium: Right atrial size was mildly dilated. Pericardium: A small pericardial effusion is present. Mitral Valve: The mitral valve is normal in structure. Mild mitral valve regurgitation. No evidence of mitral valve stenosis. Tricuspid Valve: The tricuspid valve is normal in structure. Tricuspid valve regurgitation is mild. Aortic Valve: The aortic valve is tricuspid. There is mild calcification of the aortic valve. Aortic valve regurgitation is moderate. Aortic regurgitation PHT measures 842 msec. Pulmonic  Valve: The pulmonic valve was normal in structure. Pulmonic valve regurgitation is trivial. Aorta: Aortic dilatation noted. There is severe dilatation of the ascending aorta, measuring 52 mm. Venous: The inferior vena cava is dilated in size with less than 50% respiratory variability, suggesting right atrial pressure of 15 mmHg. IAS/Shunts: No atrial level shunt detected by color flow Doppler.  LEFT VENTRICLE PLAX 2D LVIDd:         5.60 cm     Diastology LVIDs:         3.20 cm     LV e' medial:    3.23 cm/s LV PW:         1.00 cm     LV E/e' medial:  13.0 LV IVS:        0.90 cm     LV e' lateral:   3.38 cm/s LVOT diam:     2.00 cm     LV E/e' lateral: 12.4 LVOT Area:     3.14 cm  LV Volumes (MOD) LV vol d, MOD A2C: 85.5 ml LV vol d, MOD A4C: 91.5 ml LV vol s, MOD A2C: 48.3 ml LV vol s, MOD A4C: 48.2 ml LV SV MOD A2C:     37.2 ml LV SV MOD A4C:     91.5 ml LV SV MOD BP:      41.3 ml RIGHT VENTRICLE RV Basal diam:  4.70 cm RV Mid diam:    2.40 cm RV S prime:     15.20 cm/s TAPSE (M-mode): 2.0 cm LEFT ATRIUM             Index        RIGHT ATRIUM           Index LA diam:        3.20 cm 1.99 cm/m   RA Area:     18.80 cm LA Vol (A2C):   90.4 ml 56.22 ml/m  RA Volume:   48.00 ml  29.85 ml/m LA Vol (A4C):   39.3 ml 24.44 ml/m LA Biplane Vol: 59.8 ml 37.19 ml/m  AORTIC VALVE AI PHT:      842 msec  AORTA Ao Root diam: 3.10 cm Ao STJ diam:  3.1 cm Ao Asc diam:  4.90 cm MITRAL VALVE               TRICUSPID VALVE MV Area (PHT): 1.94 cm    TR Peak grad:   35.8 mmHg MV Decel Time: 391 msec    TR Vmax:        299.00 cm/s MV E velocity: 42.00 cm/s MV A velocity: 84.80 cm/s  SHUNTS MV E/A ratio:  0.50        Systemic Diam: 2.00 cm Dalton McleanMD Electronically signed by Franki Monte Signature Date/Time: 07/04/2022/10:12:09 AM    Final (Updated)    DG Knee Complete 4 Views Right  Result Date: 07/03/2022 CLINICAL DATA:  Right hip fracture EXAM: RIGHT KNEE - COMPLETE 4+ VIEW COMPARISON:  None FINDINGS: Anterior soft  tissue swelling. Osteoarthritis most pronounced in the lateral compartment. No evidence of fracture. Question old healed fracture of the proximal fibula. IMPRESSION: No acute fracture. Mild soft tissue swelling. Osteoarthritis most pronounced in the lateral compartment. Question old healed fracture of the proximal fibula. Electronically Signed   By: Nelson Chimes M.D.   On: 07/03/2022 17:16   DG Chest 1 View  Result Date: 07/03/2022 CLINICAL DATA:  Hip fracture EXAM: CHEST  1 VIEW COMPARISON:  12/24/2015 FINDINGS: Chronic cardiomegaly and aortic tortuosity. The lungs are clear. The vascularity is normal. No effusions. No visible rib fracture. IMPRESSION: Cardiomegaly and aortic atherosclerosis.  No active disease. Electronically Signed   By: Nelson Chimes M.D.   On: 07/03/2022 17:14   DG Hip Unilat With Pelvis 2-3 Views Right  Result Date: 07/03/2022 CLINICAL DATA:  Fall with pain EXAM: DG HIP (WITH OR WITHOUT PELVIS) 2-3V RIGHT COMPARISON:  None FINDINGS: Femoral neck fracture with mild displacement. No intertrochanteric component. Right hemipelvis is negative. IMPRESSION: Displaced femoral neck fracture on the right. Electronically Signed   By: Nelson Chimes M.D.   On: 07/03/2022 17:14    Microbiology: Results for orders placed or performed during the hospital encounter of 07/03/22  Surgical PCR screen     Status: None   Collection Time: 07/04/22  1:03 AM   Specimen: Nasal Mucosa; Nasal Swab  Result Value Ref Range Status   MRSA, PCR NEGATIVE NEGATIVE Final   Staphylococcus aureus NEGATIVE NEGATIVE Final    Comment: (NOTE) The Xpert SA Assay (FDA approved for NASAL specimens in patients 70 years of age and older), is one component of a comprehensive surveillance program. It is not intended to diagnose infection nor to guide or monitor treatment. Performed at [facility], [place] [address] [place], [place] [address]     Labs: CBC: Recent Labs  Lab 07/23/22 1118  07/24/22 0309  WBC 7.3 4.6  NEUTROABS 6.1  --   HGB 9.9* 9.5*  HCT 30.6* 32.0*  MCV 92.4 100.3*  PLT 357 097   Basic Metabolic Panel: Recent Labs  Lab 07/23/22 1118 07/24/22 0309  NA 141 138  K 4.8 4.1  CL 111 110  CO2 21* 20*  GLUCOSE 88 79  BUN 17 18  CREATININE 1.12* 1.12*  CALCIUM 8.5* 8.4*  MG 1.9  --    Liver Function Tests: No results for input(s): "AST", "ALT", "ALKPHOS", "BILITOT", "PROT", "ALBUMIN" in the last 168 hours. CBG: No results for input(s): "GLUCAP" in the last 168 hours.  Discharge time spent: less than 30 minutes.  Signed: Alma Friendly, MD [facility] 07/24/2022

## 2022-07-24 NOTE — ED Notes (Signed)
ED TO INPATIENT HANDOFF REPORT  ED Nurse Name and Phone #: Baxter Flattery, RN  S Name/Age/Gender Taylor Mendoza 88 y.o. female Room/Bed: 044C/044C  Code Status   Code Status: DNR  Home/SNF/Other Home Patient oriented to: self, place, time, and situation Is this baseline? Yes   Triage Complete: Triage complete  Chief Complaint A-fib [facility]) [I48.91]  Triage Note Pt BIB [facility] from [facility] in [place] c/o hypotension and lethargic and a new rate of 150-180. Per EMS they attempted vagal with no success. Pt received 800 ml NS.       Allergies Allergies  Allergen Reactions   Amlodipine Anaphylaxis, Swelling and Other (See Comments)    Edema   Alendronate Other (See Comments)    Dysphagia   Ramipril Cough    Level of Care/Admitting Diagnosis ED Disposition     ED Disposition  Admit   Condition  --   Comment  Hospital Area: [facility] [[id number]]  Level of Care: Telemetry Medical [104]  May place patient in observation at [facility] or [place] if equivalent level of care is available:: No  Covid Evaluation: Asymptomatic - no recent exposure (last 10 days) testing not required  Diagnosis: A-fib [facility]) [[id number]]  Admitting Physician: Lequita Halt [[id number]]  Attending Physician: Lequita Halt [[id number]]          B Medical/Surgery History Past Medical History:  Diagnosis Date   Coronary artery disease    Methicillin resistant Staphylococcus aureus in conditions classified elsewhere and of unspecified site    MI (myocardial infarction) ([place])    Other and unspecified hyperlipidemia    Unspecified essential hypertension    Past Surgical History:  Procedure Laterality Date   CATARACT EXTRACTION, BILATERAL     CORONARY ANGIOPLASTY WITH STENT PLACEMENT     HIP ARTHROPLASTY Right 07/04/2022   Procedure: RIGHT ANTERIOR HIP REPLACEMENT ;  Surgeon: Mcarthur Rossetti, MD;  Location: [facility];  Service: Orthopedics;  Laterality: Right;     A IV  Location/Drains/Wounds Patient Lines/Drains/Airways Status     Active Line/Drains/Airways     Name Placement date Placement time Site Days   Peripheral IV 07/23/22 20 G Right Antecubital 07/23/22  0937  Antecubital  1   Peripheral IV 07/23/22 20 G Left Antecubital 07/23/22  1550  Antecubital  1   Incision (Closed) 07/04/22 Hip Right 07/04/22  1105  -- 20   Wound / Incision (Open or Dehisced) 07/03/22 Skin tear Tibial Posterior;Proximal;Right 07/03/22  2300  Tibial  21            Intake/Output Last 24 hours No intake or output data in the 24 hours ending 07/24/22 1052  Labs/Imaging Results for orders placed or performed during the hospital encounter of 07/23/22 (from the past 48 hour(s))  Basic metabolic panel     Status: Abnormal   Collection Time: 07/23/22 11:18 AM  Result Value Ref Mendoza   Sodium 141 135 - 145 mmol/L   Potassium 4.8 3.5 - 5.1 mmol/L    Comment: HEMOLYSIS AT THIS LEVEL MAY AFFECT RESULT   Chloride 111 98 - 111 mmol/L   CO2 21 (L) 22 - 32 mmol/L   Glucose, Bld 88 70 - 99 mg/dL    Comment: Glucose reference Mendoza applies only to samples taken after fasting for at least 8 hours.   BUN 17 8 - 23 mg/dL   Creatinine, Ser 1.12 (H) 0.44 - 1.00 mg/dL   Calcium 8.5 (L) 8.9 - 10.3 mg/dL  GFR, Estimated 47 (L) >60 mL/min    Comment: (NOTE) Calculated using the CKD-EPI Creatinine Equation (2021)    Anion gap 9 5 - 15    Comment: Performed at [facility], [place] [address] [place], [place] [address]  CBC with Differential     Status: Abnormal   Collection Time: 07/23/22 11:18 AM  Result Value Ref Mendoza   WBC 7.3 4.0 - 10.5 K/uL   RBC 3.31 (L) 3.87 - 5.11 MIL/uL   Hemoglobin 9.9 (L) 12.0 - 15.0 g/dL   HCT 30.6 (L) 36.0 - 46.0 %   MCV 92.4 80.0 - 100.0 fL   MCH 29.9 26.0 - 34.0 pg   MCHC 32.4 30.0 - 36.0 g/dL   RDW 14.9 11.5 - 15.5 %   Platelets 357 150 - 400 K/uL   nRBC 0.0 0.0 - 0.2 %   Neutrophils Relative % 85 %   Neutro Abs 6.1 1.7 - 7.7 K/uL    Lymphocytes Relative 10 %   Lymphs Abs 0.7 0.7 - 4.0 K/uL   Monocytes Relative 5 %   Monocytes Absolute 0.4 0.1 - 1.0 K/uL   Eosinophils Relative 0 %   Eosinophils Absolute 0.0 0.0 - 0.5 K/uL   Basophils Relative 0 %   Basophils Absolute 0.0 0.0 - 0.1 K/uL   Immature Granulocytes 0 %   Abs Immature Granulocytes 0.03 0.00 - 0.07 K/uL    Comment: Performed at [facility] [address], [place], [place] [address]  Brain natriuretic peptide     Status: Abnormal   Collection Time: 07/23/22 11:18 AM  Result Value Ref Mendoza   B Natriuretic Peptide 486.2 (H) 0.0 - 100.0 pg/mL    Comment: Performed at [facility] [address] [place], [place] [address]  Magnesium     Status: None   Collection Time: 07/23/22 11:18 AM  Result Value Ref Mendoza   Magnesium 1.9 1.7 - 2.4 mg/dL    Comment: Performed at [facility] [address] [place], [place] [address]  TSH     Status: None   Collection Time: 07/23/22 11:18 AM  Result Value Ref Mendoza   TSH 0.434 0.350 - 4.500 uIU/mL    Comment: Performed by a 3rd Generation assay with a functional sensitivity of <=0.01 uIU/mL. Performed at [facility], [place] [address] [place], [place] [address]   T4, free     Status: Abnormal   Collection Time: 07/23/22 11:18 AM  Result Value Ref Mendoza   Free T4 1.35 (H) 0.61 - 1.12 ng/dL    Comment: HEMOLYSIS AT THIS LEVEL MAY AFFECT RESULT (NOTE) Biotin ingestion may interfere with free T4 tests. If the results are inconsistent with the TSH level, previous test results, or the clinical presentation, then consider biotin interference. If needed, order repeat testing after stopping biotin. Performed at [facility], [place] [address], [place], [place] [address]   Troponin I (High Sensitivity)     Status: Abnormal   Collection Time: 07/23/22  3:10 PM  Result Value Ref Mendoza   Troponin I (High Sensitivity) 105 (HH) <18 ng/L    Comment: CRITICAL RESULT CALLED TO, READ BACK BY AND  VERIFIED WITH K YUAL RN 1708 07/23/2022 BY R VERAAR (NOTE) Elevated high sensitivity troponin I (hsTnI) values and significant  changes across serial measurements may suggest ACS but many other  chronic and acute conditions are known to elevate hsTnI results.  Refer to the "Links"  section for chest pain algorithms and additional  guidance. Performed at [facility], [place] [address], [place], [place] [address]   Iron and TIBC     Status: Abnormal   Collection Time: 07/23/22  3:10 PM  Result Value Ref Mendoza   Iron 47 28 - 170 ug/dL    Comment: HEMOLYSIS AT THIS LEVEL MAY AFFECT RESULT   TIBC 232 (L) 250 - 450 ug/dL   Saturation Ratios 20 10.4 - 31.8 %   UIBC 185 ug/dL    Comment: Performed at [facility], [place] [address], [place], [place] [address]  Ferritin     Status: None   Collection Time: 07/23/22  3:10 PM  Result Value Ref Mendoza   Ferritin 87 11 - 307 ng/mL    Comment: Performed at [facility] [address] [place], [place] [address]  Troponin I (High Sensitivity)     Status: Abnormal   Collection Time: 07/23/22  5:23 PM  Result Value Ref Mendoza   Troponin I (High Sensitivity) 108 (HH) <18 ng/L    Comment: CRITICAL VALUE NOTED. VALUE IS CONSISTENT WITH PREVIOUSLY REPORTED/CALLED VALUE (NOTE) Elevated high sensitivity troponin I (hsTnI) values and significant  changes across serial measurements may suggest ACS but many other  chronic and acute conditions are known to elevate hsTnI results.  Refer to the "Links" section for chest pain algorithms and additional  guidance. Performed at [facility], [place] [address] [place], [place] [address]   Heparin level (unfractionated)     Status: Abnormal   Collection Time: 07/23/22  8:58 PM  Result Value Ref Mendoza   Heparin Unfractionated >1.10 (H) 0.30 - 0.70 IU/mL    Comment: (NOTE) The clinical reportable Mendoza upper limit is being lowered to >1.10 to align with the FDA approved guidance for the current  laboratory assay.  If heparin results are below expected values, and patient dosage has  been confirmed, suggest follow up testing of antithrombin III levels. Performed at [facility], [place] [address] [place], [place] [address]   APTT     Status: Abnormal   Collection Time: 07/23/22  8:58 PM  Result Value Ref Mendoza   aPTT 66 (H) 24 - 36 seconds    Comment:        IF BASELINE aPTT IS ELEVATED, SUGGEST PATIENT RISK ASSESSMENT BE USED TO DETERMINE APPROPRIATE ANTICOAGULANT THERAPY. Performed at [facility], [place] [address] [place], [place] [address]   APTT     Status: Abnormal   Collection Time: 07/24/22  3:09 AM  Result Value Ref Mendoza   aPTT 91 (H) 24 - 36 seconds    Comment:        IF BASELINE aPTT IS ELEVATED, SUGGEST PATIENT RISK ASSESSMENT BE USED TO DETERMINE APPROPRIATE ANTICOAGULANT THERAPY. Performed at [facility], [place] [address] [place], [place] [address]   Heparin level (unfractionated)     Status: Abnormal   Collection Time: 07/24/22  3:09 AM  Result Value Ref Mendoza   Heparin Unfractionated >1.10 (H) 0.30 - 0.70 IU/mL    Comment: (NOTE) The clinical reportable Mendoza upper limit is being lowered to >1.10 to align with the FDA approved guidance for the current laboratory assay.  If heparin results are below expected values, and patient dosage has  been confirmed, suggest follow up testing of antithrombin III levels. Performed at [facility], [place] [address], [place], [place] [address]   CBC  Status: Abnormal   Collection Time: 07/24/22  3:09 AM  Result Value Ref Mendoza   WBC 4.6 4.0 - 10.5 K/uL   RBC 3.19 (L) 3.87 - 5.11 MIL/uL   Hemoglobin 9.5 (L) 12.0 - 15.0 g/dL   HCT 32.0 (L) 36.0 - 46.0 %   MCV 100.3 (H) 80.0 - 100.0 fL    Comment: REPEATED TO VERIFY   MCH 29.8 26.0 - 34.0 pg   MCHC 29.7 (L) 30.0 - 36.0 g/dL   RDW 14.9 11.5 - 15.5 %   Platelets 293 150 - 400 K/uL   nRBC 0.0 0.0 - 0.2 %    Comment: Performed at [facility] [address] [place], [place] [address]  Basic metabolic panel     Status: Abnormal   Collection Time: 07/24/22  3:09 AM  Result Value Ref Mendoza   Sodium 138 135 - 145 mmol/L   Potassium 4.1 3.5 - 5.1 mmol/L   Chloride 110 98 - 111 mmol/L   CO2 20 (L) 22 - 32 mmol/L   Glucose, Bld 79 70 - 99 mg/dL    Comment: Glucose reference Mendoza applies only to samples taken after fasting for at least 8 hours.   BUN 18 8 - 23 mg/dL   Creatinine, Ser 1.12 (H) 0.44 - 1.00 mg/dL   Calcium 8.4 (L) 8.9 - 10.3 mg/dL   GFR, Estimated 47 (L) >60 mL/min    Comment: (NOTE) Calculated using the CKD-EPI Creatinine Equation (2021)    Anion gap 8 5 - 15    Comment: Performed at [facility] [address], [place], [place] [address]   CT Angio Chest PE W and/or Wo Contrast  Result Date: 07/23/2022 CLINICAL DATA:  Shortness of breath. EXAM: CT ANGIOGRAPHY CHEST WITH CONTRAST TECHNIQUE: Multidetector CT imaging of the chest was performed using the standard protocol during bolus administration of intravenous contrast. Multiplanar CT image reconstructions and MIPs were obtained to evaluate the vascular anatomy. RADIATION DOSE REDUCTION: This exam was performed according to the departmental dose-optimization program which includes automated exposure control, adjustment of the mA and/or kV according to patient size and/or use of iterative reconstruction technique. CONTRAST:  100 mL Omnipaque 350 intravenously. COMPARISON:  June 01, 2015. FINDINGS: Cardiovascular: Small filling defect is seen in lower lobe branch of right pulmonary artery consistent with small pulmonary embolus. 5.4 cm ascending thoracic aortic aneurysm is noted. Mild cardiomegaly is noted. No pericardial effusion is noted. Mediastinum/Nodes: Moderate size hiatal hernia is noted. Thyroid gland is unremarkable. No adenopathy is noted. Lungs/Pleura: No pneumothorax is noted. Small left pleural effusion is noted with minimal adjacent  subsegmental atelectasis. Upper Abdomen: No acute abnormality. Musculoskeletal: Old lower thoracic compression fracture is noted. No acute osseous abnormality is noted. Review of the MIP images confirms the above findings. IMPRESSION: Small peripheral pulmonary embolus is noted in lower lobe branch of right pulmonary artery. Critical Value/emergent results were called by telephone at the time of interpretation on 07/23/2022 at 1:44 pm to provider Georgina Snell , who verbally acknowledged these results. 5.4 cm ascending thoracic aortic aneurysm. Recommend semi-annual imaging followup by CTA or MRA and referral to cardiothoracic surgery if not already obtained. This recommendation follows 2010 ACCF/AHA/AATS/ACR/ASA/SCA/SCAI/SIR/STS/SVM Guidelines for the Diagnosis and Management of Patients With Thoracic Aortic Disease. Circulation. 2010; 121: U981-X91. Aortic aneurysm NOS (ICD10-I71.9). Small left pleural effusion is noted with minimal adjacent subsegmental atelectasis. Moderate size hiatal hernia is noted. Aortic Atherosclerosis (ICD10-I70.0). Electronically Signed   By: Jeneen Rinks  Murlean Caller M.D.   On: 07/23/2022 13:44   DG Chest Portable 1 View  Result Date: 07/23/2022 CLINICAL DATA:  Dyspnea. EXAM: PORTABLE CHEST 1 VIEW COMPARISON:  Chest radiograph 07/03/2022 FINDINGS: Stable enlargement of the cardiac silhouette. Hazy densities at the left lung base. Otherwise, the lungs are clear. Negative for a pneumothorax. No acute bone abnormality. IMPRESSION: No acute chest findings. Mild haziness at the left lung base probably represents overlying structures. Electronically Signed   By: Markus Daft M.D.   On: 07/23/2022 11:11    Pending Labs Unresulted Labs (From admission, onward)     Start     Ordered   07/24/22 0500  CBC  Daily at 5am,   R      07/23/22 1428   07/24/22 3875  Basic metabolic panel  Daily at 5am,   R     Comments: As Scheduled for 5 days    07/23/22 1522            Vitals/Pain Today's  Vitals   07/24/22 0700 07/24/22 0800 07/24/22 0830 07/24/22 0925  BP: (!) 159/49 (!) 171/75  (!) 149/55  Pulse: (!) 57 62  65  Resp: 17 20    Temp:   98.2 F (36.8 C)   TempSrc:   Oral   SpO2: 99% 99%    Weight:      Height:      PainSc:        Isolation Precautions No active isolations  Medications Medications  acetaminophen (TYLENOL) tablet 650 mg (650 mg Oral Given 07/24/22 0923)  HYDROcodone-acetaminophen (NORCO/VICODIN) 5-325 MG per tablet 1 tablet (has no administration in time Mendoza)  atorvastatin (LIPITOR) tablet 10 mg (10 mg Oral Given 07/23/22 2153)  metoprolol tartrate (LOPRESSOR) tablet 37.5 mg (37.5 mg Oral Given 07/24/22 0925)  docusate sodium (COLACE) capsule 100 mg (100 mg Oral Given 07/24/22 0923)  metoCLOPramide (REGLAN) tablet 5 mg (5 mg Oral Given 07/24/22 0923)  pantoprazole (PROTONIX) EC tablet 20 mg (20 mg Oral Given 07/24/22 0923)  polyethylene glycol (MIRALAX / GLYCOLAX) packet 17 g (17 g Oral Given 07/24/22 0925)  melatonin tablet 3 mg (has no administration in time Mendoza)  carbidopa-levodopa (SINEMET IR) 25-100 MG per tablet immediate release 1 tablet (1 tablet Oral Given 07/24/22 0925)  methocarbamol (ROBAXIN) tablet 250 mg (has no administration in time Mendoza)  sodium chloride flush (NS) 0.9 % injection 3 mL (3 mLs Intravenous Not Given 07/24/22 0926)  sodium chloride flush (NS) 0.9 % injection 3 mL (has no administration in time Mendoza)  0.9 %  sodium chloride infusion (has no administration in time Mendoza)  acetaminophen (TYLENOL) tablet 650 mg (has no administration in time Mendoza)  ondansetron (ZOFRAN) injection 4 mg (has no administration in time Mendoza)  metoprolol tartrate (LOPRESSOR) injection 2.5 mg (has no administration in time Mendoza)  ferrous sulfate tablet 325 mg (325 mg Oral Given 07/24/22 0804)  apixaban (ELIQUIS) tablet 10 mg (10 mg Oral Given 07/24/22 1004)    Followed by  apixaban (ELIQUIS) tablet 5 mg (has no administration in time Mendoza)   metoprolol tartrate (LOPRESSOR) tablet 50 mg (50 mg Oral Given 07/23/22 1121)  iohexol (OMNIPAQUE) 350 MG/ML injection 100 mL (100 mLs Intravenous Contrast Given 07/23/22 1332)  heparin bolus via infusion 1,000 Units (1,000 Units Intravenous Bolus from Bag 07/23/22 1547)    Mobility walks Low fall risk   Focused Assessments Cardiac Assessment Handoff:  Cardiac Rhythm: Atrial fibrillation Lab Results  Component Value Date   CKTOTAL  118 07/03/2022   TROPONINI (HH) 03/13/2009    12.08        POSSIBLE MYOCARDIAL ISCHEMIA. SERIAL TESTING RECOMMENDED. CRITICAL RESULT CALLED TO, READ BACK BY AND VERIFIED WITH: G ROGERS,RN [id number] WILDERK   No results found for: "DDIMER" Does the Patient currently have chest pain? No    R Recommendations: See Admitting Provider Note  Report given to:   Additional Notes:

## 2022-07-24 NOTE — Progress Notes (Addendum)
Progress Note  Patient Name: Taylor Mendoza Date of Encounter: 07/24/2022  [facility] Cardiologist: Sherren Mocha, MD   Subjective   No complaints this morning. Feels well  Inpatient Medications    Scheduled Meds:  acetaminophen  650 mg Oral TID   apixaban  10 mg Oral BID   Followed by   Derrill Memo ON 07/31/2022] apixaban  5 mg Oral BID   atorvastatin  10 mg Oral QHS   carbidopa-levodopa  1 tablet Oral TID   docusate sodium  100 mg Oral BID   ferrous sulfate  325 mg Oral Q breakfast   metoCLOPramide  5 mg Oral BID   metoprolol tartrate  37.5 mg Oral BID   pantoprazole  20 mg Oral Daily   polyethylene glycol  17 g Oral Daily   sodium chloride flush  3 mL Intravenous Q12H   Continuous Infusions:  sodium chloride     PRN Meds: sodium chloride, acetaminophen, HYDROcodone-acetaminophen, melatonin, methocarbamol, metoprolol tartrate, ondansetron (ZOFRAN) IV, sodium chloride flush   Vital Signs    Vitals:   07/24/22 0700 07/24/22 0800 07/24/22 0830 07/24/22 0925  BP: (!) 159/49 (!) 171/75  (!) 149/55  Pulse: (!) 57 62  65  Resp: 17 20    Temp:   98.2 F (36.8 C)   TempSrc:   Oral   SpO2: 99% 99%    Weight:      Height:       No intake or output data in the 24 hours ending 07/24/22 1050    07/23/2022    8:52 PM 07/19/2022    2:40 PM 07/16/2022    4:10 PM  Last 3 Weights  Weight (lbs) 125 lb 125 lb 119 lb 3.2 oz  Weight (kg) 56.7 kg 56.7 kg 54.069 kg      Telemetry    Sinus Rhythm rates 50-60s, with few short runs of atrial tachycardia, PACs - Personally Reviewed  ECG    No new tracing this morning  Physical Exam   GEN: No acute distress.   Neck: No JVD Cardiac: RRR, 2/6 diastolic murmur, no rubs, or gallops.  Respiratory: Clear to auscultation bilaterally. GI: Soft, nontender, non-distended  MS: No edema; No deformity. Neuro:  Nonfocal  Psych: Normal affect   Labs    High Sensitivity Troponin:   Recent Labs  Lab 07/03/22 2123 07/23/22 1510  07/23/22 1723  TROPONINIHS 30* 105* 108*     Chemistry Recent Labs  Lab 07/23/22 1118 07/24/22 0309  NA 141 138  K 4.8 4.1  CL 111 110  CO2 21* 20*  GLUCOSE 88 79  BUN 17 18  CREATININE 1.12* 1.12*  CALCIUM 8.5* 8.4*  MG 1.9  --   GFRNONAA 47* 47*  ANIONGAP 9 8    Lipids No results for input(s): "CHOL", "TRIG", "HDL", "LABVLDL", "LDLCALC", "CHOLHDL" in the last 168 hours.  Hematology Recent Labs  Lab 07/23/22 1118 07/24/22 0309  WBC 7.3 4.6  RBC 3.31* 3.19*  HGB 9.9* 9.5*  HCT 30.6* 32.0*  MCV 92.4 100.3*  MCH 29.9 29.8  MCHC 32.4 29.7*  RDW 14.9 14.9  PLT 357 293   Thyroid  Recent Labs  Lab 07/23/22 1118  TSH 0.434  FREET4 1.35*    BNP Recent Labs  Lab 07/23/22 1118  BNP 486.2*    DDimer No results for input(s): "DDIMER" in the last 168 hours.   Radiology    CT Angio Chest PE W and/or Wo Contrast  Result Date: 07/23/2022 CLINICAL  DATA:  Shortness of breath. EXAM: CT ANGIOGRAPHY CHEST WITH CONTRAST TECHNIQUE: Multidetector CT imaging of the chest was performed using the standard protocol during bolus administration of intravenous contrast. Multiplanar CT image reconstructions and MIPs were obtained to evaluate the vascular anatomy. RADIATION DOSE REDUCTION: This exam was performed according to the departmental dose-optimization program which includes automated exposure control, adjustment of the mA and/or kV according to patient size and/or use of iterative reconstruction technique. CONTRAST:  100 mL Omnipaque 350 intravenously. COMPARISON:  June 01, 2015. FINDINGS: Cardiovascular: Small filling defect is seen in lower lobe branch of right pulmonary artery consistent with small pulmonary embolus. 5.4 cm ascending thoracic aortic aneurysm is noted. Mild cardiomegaly is noted. No pericardial effusion is noted. Mediastinum/Nodes: Moderate size hiatal hernia is noted. Thyroid gland is unremarkable. No adenopathy is noted. Lungs/Pleura: No pneumothorax is noted.  Small left pleural effusion is noted with minimal adjacent subsegmental atelectasis. Upper Abdomen: No acute abnormality. Musculoskeletal: Old lower thoracic compression fracture is noted. No acute osseous abnormality is noted. Review of the MIP images confirms the above findings. IMPRESSION: Small peripheral pulmonary embolus is noted in lower lobe branch of right pulmonary artery. Critical Value/emergent results were called by telephone at the time of interpretation on 07/23/2022 at 1:44 pm to provider Georgina Snell , who verbally acknowledged these results. 5.4 cm ascending thoracic aortic aneurysm. Recommend semi-annual imaging followup by CTA or MRA and referral to cardiothoracic surgery if not already obtained. This recommendation follows 2010 ACCF/AHA/AATS/ACR/ASA/SCA/SCAI/SIR/STS/SVM Guidelines for the Diagnosis and Management of Patients With Thoracic Aortic Disease. Circulation. 2010; 121: W431-V40. Aortic aneurysm NOS (ICD10-I71.9). Small left pleural effusion is noted with minimal adjacent subsegmental atelectasis. Moderate size hiatal hernia is noted. Aortic Atherosclerosis (ICD10-I70.0). Electronically Signed   By: Marijo Conception M.D.   On: 07/23/2022 13:44   DG Chest Portable 1 View  Result Date: 07/23/2022 CLINICAL DATA:  Dyspnea. EXAM: PORTABLE CHEST 1 VIEW COMPARISON:  Chest radiograph 07/03/2022 FINDINGS: Stable enlargement of the cardiac silhouette. Hazy densities at the left lung base. Otherwise, the lungs are clear. Negative for a pneumothorax. No acute bone abnormality. IMPRESSION: No acute chest findings. Mild haziness at the left lung base probably represents overlying structures. Electronically Signed   By: Markus Daft M.D.   On: 07/23/2022 11:11    Cardiac Studies   N/a   Patient Profile     88 y.o. female with PMH of CAD, CKD stage III, and recent hip surgery who was seen for narrow complex tachycardia.   Assessment & Plan    Paroxysmal atrial tachycardia: In review of  telemetry she has mostly sinus to sinus bradycardia with PACs, few brief runs of atrial tachycardia. -- Continue metoprolol 37.5 mg twice daily -- Transitioned back to Eliquis (PE dosing) -- Plan for ZIO monitor at discharge to ensure no atrial fibrillation noted as an outpatient  Recent DVT/PE S/p right hip surgery Postop anemia -- Recent diagnosis of DVT, CT angio with small PE in the setting of recent hip surgery -- As above transitioning from heparin to Eliquis  Hypertension: Variable -- Continue metoprolol 37.5 mg twice daily  CAD s/p prior PCI: No anginal symptoms -- Continue statin and beta-blocker  Moderate AI: Treated medically, surveillance echocardiograms   For questions or updates, please contact [facility] Please consult [email or web address] for contact info under        Signed, Reino Bellis, NP  07/24/2022, 10:50 AM    Patient seen, examined. Available data reviewed. Agree with findings, assessment,  and plan as outlined by Reino Bellis, NP.  The patient is doing well today and has had no further palpitations.  She has had a few brief runs of atrial tachycardia but the rate has been slower.  I would recommend continuing her on metoprolol 37.5 mg twice daily, she has been transitioned back to apixaban, and we will arrange for a ZIO monitor to be placed for further arrhythmia monitoring.  Otherwise as outlined above.  On my personal exam today, she is alert, oriented, in no distress.  Lungs are clear bilaterally, heart is regular rate and rhythm with a 1/6 diastolic decrescendo murmur at the left lower sternal border, abdomen is soft and nontender, the right lower extremity has mild edema in the left lower extremity has no edema.  Reviewed findings and plan with the patient and her daughter who is present at the bedside.  The patient is stable from a cardiac perspective and we do not plan on any other inpatient evaluation at this time.  Sherren Mocha,  M.D. 07/24/2022 12:26 PM

## 2022-07-24 NOTE — Discharge Planning (Signed)
RNCM received call from [facility] regarding pt returning to the facility.  RN may call report to [phone].

## 2022-07-24 NOTE — Progress Notes (Signed)
Heart Failure Nurse Navigator Progress Note   Patient will be discharged back to [facility] with a follow up appointment with Dr. Burt Knack on 08/05/2022. Patient will be discharged with a Zio monitor for further arrhythmia monitoring.   Earnestine Leys, BSN, Clinical cytogeneticist Only

## 2022-07-26 ENCOUNTER — Non-Acute Institutional Stay (SKILLED_NURSING_FACILITY): Payer: PPO | Admitting: Nurse Practitioner

## 2022-07-26 ENCOUNTER — Encounter: Payer: Self-pay | Admitting: Nurse Practitioner

## 2022-07-26 DIAGNOSIS — D649 Anemia, unspecified: Secondary | ICD-10-CM

## 2022-07-26 DIAGNOSIS — G20A1 Parkinson's disease without dyskinesia, without mention of fluctuations: Secondary | ICD-10-CM | POA: Insufficient documentation

## 2022-07-26 DIAGNOSIS — S72001D Fracture of unspecified part of neck of right femur, subsequent encounter for closed fracture with routine healing: Secondary | ICD-10-CM | POA: Diagnosis not present

## 2022-07-26 DIAGNOSIS — I10 Essential (primary) hypertension: Secondary | ICD-10-CM

## 2022-07-26 DIAGNOSIS — I82411 Acute embolism and thrombosis of right femoral vein: Secondary | ICD-10-CM | POA: Diagnosis not present

## 2022-07-26 DIAGNOSIS — I2510 Atherosclerotic heart disease of native coronary artery without angina pectoris: Secondary | ICD-10-CM

## 2022-07-26 DIAGNOSIS — N1832 Chronic kidney disease, stage 3b: Secondary | ICD-10-CM

## 2022-07-26 DIAGNOSIS — I2699 Other pulmonary embolism without acute cor pulmonale: Secondary | ICD-10-CM | POA: Diagnosis not present

## 2022-07-26 DIAGNOSIS — I480 Paroxysmal atrial fibrillation: Secondary | ICD-10-CM | POA: Diagnosis not present

## 2022-07-26 DIAGNOSIS — E785 Hyperlipidemia, unspecified: Secondary | ICD-10-CM | POA: Diagnosis not present

## 2022-07-26 DIAGNOSIS — K219 Gastro-esophageal reflux disease without esophagitis: Secondary | ICD-10-CM

## 2022-07-26 DIAGNOSIS — G20 Parkinson's disease: Secondary | ICD-10-CM

## 2022-07-26 DIAGNOSIS — K5901 Slow transit constipation: Secondary | ICD-10-CM | POA: Diagnosis not present

## 2022-07-26 NOTE — Assessment & Plan Note (Signed)
s/p stent

## 2022-07-26 NOTE — Assessment & Plan Note (Signed)
PE-left peripheral lung on CTA 07/24/22/Acute DVT of femoral vein of RLE 07/19/22, on Eliquis

## 2022-07-26 NOTE — Assessment & Plan Note (Signed)
takes Pantoprazole, Reglan

## 2022-07-26 NOTE — Progress Notes (Unsigned)
Location:  [facility] Room Number: NO/65/A Place of Service:  SNF (31) Provider:  Mast, Man X, NP   Patient Care Team: [name], MD as PCP - General (Internal Medicine) Sherren Mocha, MD as PCP - Cardiology (Cardiology) [name], MD (Internal Medicine) Tat, Taylor Quail, DO as Consulting Physician (Neurology)  Extended Emergency Contact Information Primary Emergency Contact: Felipa Eth Home Phone: [phone] Mobile Phone: [phone] Relation: Daughter  Code Status:  DNR Goals of care: Advanced Directive information    07/26/2022    8:57 AM  Advanced Directives  Does Patient Have a Medical Advance Directive? No  Would patient like information on creating a medical advance directive? No - Guardian declined     Chief Complaint  Patient presents with  . Hospitalization Follow-up    Patient is here for follow up after hospital stay     HPI:  Pt is a 88 y.o. female seen today for an acute visit for medication review following hospital stay.   Hospitalized 07/23/22-07/24/22 Afib, acute PE and tachycardia.   Afib vs SVT, SR up dc, on ZIO, Junctional rhythm in preop, echo mod AR, f/u Cardiology  CAD s/p stent  Post op anemia, on Iron, FE 47 07/23/22, Hgb 9.5 07/24/22<<9.2 07/25/22  PE-left peripheral lung on CTA 07/24/22/Acute DVT of femoral vein of RLE 07/19/22, on Eliquis  S/p R hip hemiarthroplasty, takes Tylenol, prn Norco, prn Robaxin, WBAT, on Eliquis  CKD Bun/creat 18/1.12 07/24/22  Parkinson's disease takes Sinemet  HLD takes Atorvastatin  HTN, takes Metoprolol  GERD takes Pantoprazole, Reglan  Constipation, takes MiraLax daily.     Past Medical History:  Diagnosis Date  . Coronary artery disease   . Methicillin resistant Staphylococcus aureus in conditions classified elsewhere and of unspecified site   . MI (myocardial infarction) ([place])   . Other and unspecified hyperlipidemia   . Unspecified essential hypertension    Past  Surgical History:  Procedure Laterality Date  . CATARACT EXTRACTION, BILATERAL    . CORONARY ANGIOPLASTY WITH STENT PLACEMENT    . HIP ARTHROPLASTY Right 07/04/2022   Procedure: RIGHT ANTERIOR HIP REPLACEMENT ;  Surgeon: Mcarthur Rossetti, MD;  Location: [facility];  Service: Orthopedics;  Laterality: Right;    Allergies  Allergen Reactions  . Amlodipine Anaphylaxis, Swelling and Other (See Comments)    Edema  . Alendronate Other (See Comments)    Dysphagia  . Ramipril Cough    Outpatient Encounter Medications as of 07/26/2022  Medication Sig  . acetaminophen (TYLENOL) 325 MG tablet Take 650 mg by mouth 3 (three) times daily.  Marland Kitchen apixaban (ELIQUIS) 5 MG TABS tablet Take 10 mg by mouth 2 (two) times daily.  Marland Kitchen atorvastatin (LIPITOR) 10 MG tablet Take 10 mg by mouth at bedtime.  . carbidopa-levodopa (SINEMET IR) 25-100 MG tablet Take 1 tablet by mouth 3 (three) times daily.  Marland Kitchen docusate sodium (COLACE) 100 MG capsule Take 1 capsule (100 mg total) by mouth 2 (two) times daily.  . ferrous sulfate 325 (65 FE) MG tablet Take 1 tablet (325 mg total) by mouth daily with breakfast.  . furosemide (LASIX) 20 MG tablet Take 20 mg by mouth daily as needed for fluid.  Marland Kitchen HYDROcodone-acetaminophen (NORCO/VICODIN) 5-325 MG tablet Take 1 tablet by mouth every 6 (six) hours as needed for moderate pain.  . melatonin 1 MG TABS tablet Take 2 mg by mouth daily as needed (sleep).  . methocarbamol (ROBAXIN) 500 MG tablet Take 250 mg by mouth at bedtime  as needed for muscle spasms. Avoid Norco use only for moderate pain.  Marland Kitchen metoCLOPramide (REGLAN) 5 MG tablet Take 5 mg by mouth 2 (two) times daily.  . metoprolol tartrate 37.5 MG TABS Take 37.5 mg by mouth 2 (two) times daily.  . pantoprazole (PROTONIX) 20 MG tablet Take 20 mg by mouth daily.  . polyethylene glycol (MIRALAX / GLYCOLAX) 17 g packet Take 17 g by mouth daily.  . nitroGLYCERIN (NITROSTAT) 0.4 MG SL tablet Place 1 tablet (0.4 mg total) under the  tongue every 5 (five) minutes as needed for chest pain.  . [DISCONTINUED] APIXABAN (ELIQUIS) VTE STARTER PACK (10MG AND 5MG) Take as directed on package: start with two-45m tablets twice daily for 7 days. On day 8, switch to one-538mtablet twice daily. (Patient not taking: Reported on 07/23/2022)   No facility-administered encounter medications on file as of 07/26/2022.    Review of Systems  Constitutional:  Negative for appetite change, fatigue and fever.  HENT:  Positive for hearing loss. Negative for congestion.   Eyes:  Negative for visual disturbance.  Respiratory:  Negative for cough, chest tightness, shortness of breath and wheezing.   Cardiovascular:  Positive for leg swelling.  Gastrointestinal:  Negative for abdominal pain and constipation.  Genitourinary:  Negative for dysuria, frequency and urgency.  Musculoskeletal:  Positive for arthralgias and gait problem.  Skin:  Negative for color change.  Neurological:  Negative for tremors, speech difficulty, weakness and headaches.  Psychiatric/Behavioral:  Positive for dysphoric mood. Negative for behavioral problems and sleep disturbance. The patient is not nervous/anxious.     Immunization History  Administered Date(s) Administered  . Influenza Split 09/24/2010, 09/04/2011, 09/18/2012, 09/08/2013, 09/09/2015, 10/21/2016, 09/22/2019, 09/13/2021  . Influenza-Unspecified 11/09/2014, 09/30/2015, 09/17/2016, 09/01/2017, 09/10/2018  . Moderna Sars-Covid-2 Vaccination 12/13/2019, 01/09/2021, 05/16/2021  . PFIZER(Purple Top)SARS-COV-2 Vaccination 01/10/2020  . Pneumococcal Conjugate-13 04/18/2014  . Pneumococcal Polysaccharide-23 09/20/2002, 09/23/2019  . Td 10/11/2004  . Tdap 06/14/2016  . Zoster Recombinat (Shingrix) 02/10/2019, 05/04/2019  . Zoster, Live 01/23/2009   Pertinent  Health Maintenance Due  Topic Date Due  . INFLUENZA VACCINE  07/09/2022  . DEXA SCAN  Completed      07/07/2022    9:25 PM 07/08/2022    9:00 AM  07/23/2022    9:46 AM 07/23/2022   10:00 PM 07/24/2022   10:01 AM  Fall Risk  Patient Fall Risk Level High fall risk High fall risk Moderate fall risk Moderate fall risk Low fall risk   Functional Status Survey:    Vitals:   07/26/22 0858  BP: (!) 154/80  Pulse: 63  Resp: 18  Temp: (!) 97.1 F (36.2 C)  SpO2: 97%  Weight: 125 lb (56.7 kg)  Height: 5' 6" (1.676 m)   Body mass index is 20.18 kg/m. Physical Exam Vitals and nursing note reviewed.  Constitutional:      Appearance: Normal appearance.  HENT:     Head: Normocephalic and atraumatic.     Nose: Nose normal.     Mouth/Throat:     Mouth: Mucous membranes are moist.  Eyes:     Extraocular Movements: Extraocular movements intact.     Conjunctiva/sclera: Conjunctivae normal.     Pupils: Pupils are equal, round, and reactive to light.  Cardiovascular:     Rate and Rhythm: Normal rate and regular rhythm.     Heart sounds: No murmur heard. Pulmonary:     Effort: Pulmonary effort is normal.     Breath sounds: No wheezing or rales.  Abdominal:     General: Bowel sounds are normal.     Palpations: Abdomen is soft.     Tenderness: There is no abdominal tenderness.  Musculoskeletal:     Cervical back: Normal range of motion and neck supple.     Right lower leg: Edema present.     Left lower leg: No edema.     Comments: 1+edema RLE  Skin:    General: Skin is warm and dry.  Neurological:     General: No focal deficit present.     Mental Status: She is alert and oriented to person, place, and time. Mental status is at baseline.     Gait: Gait abnormal.  Psychiatric:        Mood and Affect: Mood normal.        Behavior: Behavior normal.        Thought Content: Thought content normal.        Judgment: Judgment normal.    Labs reviewed: Recent Labs    07/05/22 0335 07/06/22 0403 07/23/22 1118 07/24/22 0309  NA 142 139 141 138  K 3.4* 4.3 4.8 4.1  CL 110 109 111 110  CO2 24 24 21* 20*  GLUCOSE 96 98 88 79   BUN 52* 36* 17 18  CREATININE 1.49* 1.29* 1.12* 1.12*  CALCIUM 7.8* 8.2* 8.5* 8.4*  MG 1.9 1.8 1.9  --    Recent Labs    07/04/22 0340  AST 18  ALT 6  ALKPHOS 74  BILITOT 0.7  PROT 6.1*  ALBUMIN 3.3*   Recent Labs    07/05/22 0335 07/06/22 0403 07/23/22 1118 07/24/22 0309  WBC 6.6 6.2 7.3 4.6  NEUTROABS 4.7 4.6 6.1  --   HGB 9.6* 9.3* 9.9* 9.5*  HCT 29.5* 28.7* 30.6* 32.0*  MCV 89.4 89.7 92.4 100.3*  PLT 145* 144* 357 293   Lab Results  Component Value Date   TSH 0.434 07/23/2022   No results found for: "HGBA1C" Lab Results  Component Value Date   CHOL 145 09/22/2009   HDL 52.60 09/22/2009   LDLCALC 73 09/22/2009   TRIG 97.0 09/22/2009   CHOLHDL 3 09/22/2009    Significant Diagnostic Results in last 30 days:  CT Angio Chest PE W and/or Wo Contrast  Result Date: 07/23/2022 CLINICAL DATA:  Shortness of breath. EXAM: CT ANGIOGRAPHY CHEST WITH CONTRAST TECHNIQUE: Multidetector CT imaging of the chest was performed using the standard protocol during bolus administration of intravenous contrast. Multiplanar CT image reconstructions and MIPs were obtained to evaluate the vascular anatomy. RADIATION DOSE REDUCTION: This exam was performed according to the departmental dose-optimization program which includes automated exposure control, adjustment of the mA and/or kV according to patient size and/or use of iterative reconstruction technique. CONTRAST:  100 mL Omnipaque 350 intravenously. COMPARISON:  June 01, 2015. FINDINGS: Cardiovascular: Small filling defect is seen in lower lobe branch of right pulmonary artery consistent with small pulmonary embolus. 5.4 cm ascending thoracic aortic aneurysm is noted. Mild cardiomegaly is noted. No pericardial effusion is noted. Mediastinum/Nodes: Moderate size hiatal hernia is noted. Thyroid gland is unremarkable. No adenopathy is noted. Lungs/Pleura: No pneumothorax is noted. Small left pleural effusion is noted with minimal adjacent  subsegmental atelectasis. Upper Abdomen: No acute abnormality. Musculoskeletal: Old lower thoracic compression fracture is noted. No acute osseous abnormality is noted. Review of the MIP images confirms the above findings. IMPRESSION: Small peripheral pulmonary embolus is noted in lower lobe branch of right pulmonary artery. Critical Value/emergent results were called  by telephone at the time of interpretation on 07/23/2022 at 1:44 pm to provider Georgina Snell , who verbally acknowledged these results. 5.4 cm ascending thoracic aortic aneurysm. Recommend semi-annual imaging followup by CTA or MRA and referral to cardiothoracic surgery if not already obtained. This recommendation follows 2010 ACCF/AHA/AATS/ACR/ASA/SCA/SCAI/SIR/STS/SVM Guidelines for the Diagnosis and Management of Patients With Thoracic Aortic Disease. Circulation. 2010; 121: R416-L84. Aortic aneurysm NOS (ICD10-I71.9). Small left pleural effusion is noted with minimal adjacent subsegmental atelectasis. Moderate size hiatal hernia is noted. Aortic Atherosclerosis (ICD10-I70.0). Electronically Signed   By: Marijo Conception M.D.   On: 07/23/2022 13:44   DG Chest Portable 1 View  Result Date: 07/23/2022 CLINICAL DATA:  Dyspnea. EXAM: PORTABLE CHEST 1 VIEW COMPARISON:  Chest radiograph 07/03/2022 FINDINGS: Stable enlargement of the cardiac silhouette. Hazy densities at the left lung base. Otherwise, the lungs are clear. Negative for a pneumothorax. No acute bone abnormality. IMPRESSION: No acute chest findings. Mild haziness at the left lung base probably represents overlying structures. Electronically Signed   By: Markus Daft M.D.   On: 07/23/2022 11:11   Pelvis Portable  Result Date: 07/04/2022 CLINICAL DATA:  Postoperative hip replacement EXAM: PORTABLE PELVIS 1-2 VIEWS COMPARISON:  Pelvis and right hip radiographs 07/03/2022 FINDINGS: Interval total right hip arthroplasty. No perihardware lucency is seen to indicate hardware failure or  loosening. Expected postoperative changes including subcutaneous air and soft tissue swelling about the right hip. Lateral right hip surgical skin staples. Mild pubic symphysis osteoarthritis. No acute fracture or dislocation. IMPRESSION: Interval total right hip arthroplasty without evidence of hardware failure. Electronically Signed   By: Yvonne Kendall M.D.   On: 07/04/2022 12:43   DG HIP UNILAT WITH PELVIS 1V RIGHT  Result Date: 07/04/2022 CLINICAL DATA:  Fluoroscopic assistance for right hip arthroplasty EXAM: DG HIP (WITH OR WITHOUT PELVIS) 1V RIGHT COMPARISON:  None Available. FINDINGS: Fluoroscopic images show right hip arthroplasty. Fluoroscopy time 7 seconds. Radiation dose 0.8 mGy. IMPRESSION: Fluoroscopic assistance was provided for right hip arthroplasty. Electronically Signed   By: Elmer Picker M.D.   On: 07/04/2022 11:59   DG C-Arm 1-60 Min-No Report  Result Date: 07/04/2022 Fluoroscopy was utilized by the requesting physician.  No radiographic interpretation.   ECHOCARDIOGRAM COMPLETE  Result Date: 07/04/2022    ECHOCARDIOGRAM REPORT   Patient Name:   Taylor Mendoza Date of Exam: 07/04/2022 Medical Rec #:  [id number]       Height:       66.0 in Accession #:    [id number]      Weight:       119.7 lb Date of Birth:  03/29/1930       BSA:          1.608 m Patient Age:    47 years        BP:           126/77 mmHg Patient Gender: F               HR:           46 bpm. Exam Location:  Inpatient Procedure: 2D Echo, Cardiac Doppler, Color Doppler and Intracardiac            Opacification Agent STAT ECHO Indications:     Abnormal ECG R94.31  History:         Patient has prior history of Echocardiogram examinations, most                  recent 10/04/2016.  CAD and Previous Myocardial Infarction;                  Arrythmias:Bradycardia. Fractured hip.  Sonographer:     Darlina Sicilian RDCS Referring Phys:  Nicasio Diagnosing Phys: Kirk Ruths McleanMD IMPRESSIONS  1. Left ventricular  ejection fraction, by estimation, is 50%. The left ventricle has mildly decreased function. The left ventricle demonstrates global hypokinesis. There is mild concentric left ventricular hypertrophy. Left ventricular diastolic parameters are consistent with Grade I diastolic dysfunction (impaired relaxation).  2. Right ventricular systolic function is normal. The right ventricular size is normal. There is moderately elevated pulmonary artery systolic pressure. The estimated right ventricular systolic pressure is 00.7 mmHg.  3. Left atrial size was moderately dilated.  4. Right atrial size was mildly dilated.  5. The mitral valve is normal in structure. Mild mitral valve regurgitation. No evidence of mitral stenosis.  6. The aortic valve is tricuspid. There is mild calcification of the aortic valve. Aortic valve regurgitation is moderate.  7. Aortic dilatation noted. There is severe dilatation of the ascending aorta, measuring 52 mm.  8. The inferior vena cava is dilated in size with <50% respiratory variability, suggesting right atrial pressure of 15 mmHg.  9. A small pericardial effusion is present. FINDINGS  Left Ventricle: Left ventricular ejection fraction, by estimation, is 50%. The left ventricle has mildly decreased function. The left ventricle demonstrates global hypokinesis. Definity contrast agent was given IV to delineate the left ventricular endocardial borders. The left ventricular internal cavity size was normal in size. There is mild concentric left ventricular hypertrophy. Left ventricular diastolic parameters are consistent with Grade I diastolic dysfunction (impaired relaxation). Right Ventricle: The right ventricular size is normal. No increase in right ventricular wall thickness. Right ventricular systolic function is normal. There is moderately elevated pulmonary artery systolic pressure. The tricuspid regurgitant velocity is 2.99 m/s, and with an assumed right atrial pressure of 15 mmHg, the  estimated right ventricular systolic pressure is 12.1 mmHg. Left Atrium: Left atrial size was moderately dilated. Right Atrium: Right atrial size was mildly dilated. Pericardium: A small pericardial effusion is present. Mitral Valve: The mitral valve is normal in structure. Mild mitral valve regurgitation. No evidence of mitral valve stenosis. Tricuspid Valve: The tricuspid valve is normal in structure. Tricuspid valve regurgitation is mild. Aortic Valve: The aortic valve is tricuspid. There is mild calcification of the aortic valve. Aortic valve regurgitation is moderate. Aortic regurgitation PHT measures 842 msec. Pulmonic Valve: The pulmonic valve was normal in structure. Pulmonic valve regurgitation is trivial. Aorta: Aortic dilatation noted. There is severe dilatation of the ascending aorta, measuring 52 mm. Venous: The inferior vena cava is dilated in size with less than 50% respiratory variability, suggesting right atrial pressure of 15 mmHg. IAS/Shunts: No atrial level shunt detected by color flow Doppler.  LEFT VENTRICLE PLAX 2D LVIDd:         5.60 cm     Diastology LVIDs:         3.20 cm     LV e' medial:    3.23 cm/s LV PW:         1.00 cm     LV E/e' medial:  13.0 LV IVS:        0.90 cm     LV e' lateral:   3.38 cm/s LVOT diam:     2.00 cm     LV E/e' lateral: 12.4 LVOT Area:     3.14 cm  LV  Volumes (MOD) LV vol d, MOD A2C: 85.5 ml LV vol d, MOD A4C: 91.5 ml LV vol s, MOD A2C: 48.3 ml LV vol s, MOD A4C: 48.2 ml LV SV MOD A2C:     37.2 ml LV SV MOD A4C:     91.5 ml LV SV MOD BP:      41.3 ml RIGHT VENTRICLE RV Basal diam:  4.70 cm RV Mid diam:    2.40 cm RV S prime:     15.20 cm/s TAPSE (M-mode): 2.0 cm LEFT ATRIUM             Index        RIGHT ATRIUM           Index LA diam:        3.20 cm 1.99 cm/m   RA Area:     18.80 cm LA Vol (A2C):   90.4 ml 56.22 ml/m  RA Volume:   48.00 ml  29.85 ml/m LA Vol (A4C):   39.3 ml 24.44 ml/m LA Biplane Vol: 59.8 ml 37.19 ml/m  AORTIC VALVE AI PHT:      842 msec   AORTA Ao Root diam: 3.10 cm Ao STJ diam:  3.1 cm Ao Asc diam:  4.90 cm MITRAL VALVE               TRICUSPID VALVE MV Area (PHT): 1.94 cm    TR Peak grad:   35.8 mmHg MV Decel Time: 391 msec    TR Vmax:        299.00 cm/s MV E velocity: 42.00 cm/s MV A velocity: 84.80 cm/s  SHUNTS MV E/A ratio:  0.50        Systemic Diam: 2.00 cm Dalton McleanMD Electronically signed by Franki Monte Signature Date/Time: 07/04/2022/10:12:09 AM    Final (Updated)    DG Knee Complete 4 Views Right  Result Date: 07/03/2022 CLINICAL DATA:  Right hip fracture EXAM: RIGHT KNEE - COMPLETE 4+ VIEW COMPARISON:  None FINDINGS: Anterior soft tissue swelling. Osteoarthritis most pronounced in the lateral compartment. No evidence of fracture. Question old healed fracture of the proximal fibula. IMPRESSION: No acute fracture. Mild soft tissue swelling. Osteoarthritis most pronounced in the lateral compartment. Question old healed fracture of the proximal fibula. Electronically Signed   By: Nelson Chimes M.D.   On: 07/03/2022 17:16   DG Chest 1 View  Result Date: 07/03/2022 CLINICAL DATA:  Hip fracture EXAM: CHEST  1 VIEW COMPARISON:  12/24/2015 FINDINGS: Chronic cardiomegaly and aortic tortuosity. The lungs are clear. The vascularity is normal. No effusions. No visible rib fracture. IMPRESSION: Cardiomegaly and aortic atherosclerosis.  No active disease. Electronically Signed   By: Nelson Chimes M.D.   On: 07/03/2022 17:14   DG Hip Unilat With Pelvis 2-3 Views Right  Result Date: 07/03/2022 CLINICAL DATA:  Fall with pain EXAM: DG HIP (WITH OR WITHOUT PELVIS) 2-3V RIGHT COMPARISON:  None FINDINGS: Femoral neck fracture with mild displacement. No intertrochanteric component. Right hemipelvis is negative. IMPRESSION: Displaced femoral neck fracture on the right. Electronically Signed   By: Nelson Chimes M.D.   On: 07/03/2022 17:14    Assessment/Plan Normocytic anemia Post op anemia, on Iron, FE 47 07/23/22, Hgb 9.5 07/24/22>>9.2 07/25/22,  will reduce to Fe 3x/wk to avoid GI side effects. Update CBC/diff  GERD (gastroesophageal reflux disease) takes Pantoprazole, Reglan  A-fib (HCC) Afib vs SVT, SR up dc, on ZIO, Junctional rhythm in preop, echo mod AR, f/u Cardiology  CAD, NATIVE VESSEL  s/p  stent  Acute pulmonary embolism ([facility]CC) PE-left peripheral lung on CTA 07/24/22/Acute DVT of femoral vein of RLE 07/19/22, on Eliquis  DVT (deep venous thrombosis) ([facility]CC) PE-left peripheral lung on CTA 07/24/22/Acute DVT of femoral vein of RLE 07/19/22, on Eliquis  Femoral neck fracture [facility]) S/p R hip hemiarthroplasty, takes Tylenol, prn Norco, prn Robaxin, WBAT, on Eliquis  CKD (chronic kidney disease), stage III ([employer]) Bun/creat 18/1.12 07/24/22, update CMP/eGFR  Parkinson's disease ([place]) Unsteady gait,  takes Sinemet, able to feed self.   HLD (hyperlipidemia) takes Atorvastatin  Essential hypertension Blood pressure is controlled, continue Metoprolol.   Slow transit constipation Stable,  takes MiraLax daily.      Family/ staff Communication: plan of care reviewed with the patient and charge nurse.   Labs/tests ordered:  CBC/diff, CMP/eGFR  Time spend 35 minutes.

## 2022-07-26 NOTE — Assessment & Plan Note (Signed)
Unsteady gait,  takes Sinemet, able to feed self.

## 2022-07-26 NOTE — Assessment & Plan Note (Signed)
Bun/creat 18/1.12 07/24/22, update CMP/eGFR

## 2022-07-26 NOTE — Assessment & Plan Note (Signed)
takes Atorvastatin  

## 2022-07-26 NOTE — Assessment & Plan Note (Signed)
Blood pressure is controlled, continue Metoprolol. 

## 2022-07-26 NOTE — Assessment & Plan Note (Signed)
S/p R hip hemiarthroplasty, takes Tylenol, prn Norco, prn Robaxin, WBAT, on Eliquis

## 2022-07-26 NOTE — Assessment & Plan Note (Signed)
Stable,  takes MiraLax daily.

## 2022-07-26 NOTE — Assessment & Plan Note (Addendum)
Post op anemia, on Iron, FE 47 07/23/22, Hgb 9.5 07/24/22>>9.2 07/25/22, will reduce to Fe 3x/wk to avoid GI side effects. Update CBC/diff

## 2022-07-26 NOTE — Assessment & Plan Note (Signed)
Afib vs SVT, SR up dc, on ZIO, Junctional rhythm in preop, echo mod AR, f/u Cardiology

## 2022-07-27 DIAGNOSIS — R000 Tachycardia, unspecified: Secondary | ICD-10-CM

## 2022-07-27 DIAGNOSIS — I471 Supraventricular tachycardia: Secondary | ICD-10-CM

## 2022-07-30 ENCOUNTER — Ambulatory Visit (INDEPENDENT_AMBULATORY_CARE_PROVIDER_SITE_OTHER): Payer: PPO | Admitting: Orthopaedic Surgery

## 2022-07-30 ENCOUNTER — Encounter: Payer: Self-pay | Admitting: Orthopaedic Surgery

## 2022-07-30 DIAGNOSIS — Z96641 Presence of right artificial hip joint: Secondary | ICD-10-CM

## 2022-07-30 DIAGNOSIS — Z96649 Presence of unspecified artificial hip joint: Secondary | ICD-10-CM

## 2022-07-30 NOTE — Progress Notes (Signed)
The patient is getting close to 4 weeks out from a right direct anterior hip replacement to treat a displaced femoral neck fracture.  This was sustained after mechanical fall.  She states that friends homeless.  She had some setbacks after surgery in terms of some heart issues and a blood clot.  Her surgery was a hemiarthroplasty.  She is working on getting back on track in terms of therapy and mobility as well as strength.  She does appear frail.  Her right hip incision looks good.  Her leg lengths are equal.  She does tolerate me putting her right hip through gentle motion.  We removed the staples and place Steri-Strips.  She will continue to slowly mobilize as comfort allows.  I would like to see her back in 4 weeks to see how she is doing overall and I would like a standing AP pelvis at that visit.

## 2022-08-05 ENCOUNTER — Ambulatory Visit: Payer: PPO | Admitting: Cardiovascular Disease

## 2022-08-06 ENCOUNTER — Encounter: Payer: Self-pay | Admitting: Cardiovascular Disease

## 2022-08-06 ENCOUNTER — Ambulatory Visit: Payer: PPO | Attending: Cardiovascular Disease | Admitting: Cardiovascular Disease

## 2022-08-06 VITALS — BP 100/70 | HR 96 | Ht 66.0 in | Wt 116.2 lb

## 2022-08-06 DIAGNOSIS — I471 Supraventricular tachycardia: Secondary | ICD-10-CM

## 2022-08-06 DIAGNOSIS — I2510 Atherosclerotic heart disease of native coronary artery without angina pectoris: Secondary | ICD-10-CM

## 2022-08-06 NOTE — Progress Notes (Signed)
Cardiology Office Note:    Date:  08/06/2022   ID:  Taylor Mendoza, DOB 07-10-1930, MRN [id number]  PCP:  Taylor Mendoza, [place] [facility] Cardiologist:  Taylor Mocha, MD     Referring MD: Taylor Orn, MD   Chief Complaint  Patient presents with   Follow-up    Atrial tachycardia    History of Present Illness:    Taylor Mendoza is a 88 y.o. female presenting for hospital follow-up evaluation.  The patient has a history of coronary artery disease and remote MI in 2010.  She had a mechanical fall and underwent right hip arthroplasty last month, but more recently developed weakness, heart palpitations, and dizziness prompting ER evaluation when the patient was found to be in SVT.  Review of her rhythm strips was felt to represent atrial tachycardia.  She had brief episodes associated with symptoms outlined above.  She had no chest pain or pressure.  She is here with her daughter today.  She still in short-term SNF for rehab but is going to be going back to assisted living in the near future.  Her husband is in a memory care unit and she is looking forward to being able to visit him on a daily basis again.  She has had no further heart palpitations since her discharge from the hospital.  Past Medical History:  Diagnosis Date   Coronary artery disease    Methicillin resistant Staphylococcus aureus in conditions classified elsewhere and of unspecified site    MI (myocardial infarction) ([place])    Other and unspecified hyperlipidemia    Unspecified essential hypertension     Past Surgical History:  Procedure Laterality Date   CATARACT EXTRACTION, BILATERAL     CORONARY ANGIOPLASTY WITH STENT PLACEMENT     HIP ARTHROPLASTY Right 07/04/2022   Procedure: RIGHT ANTERIOR HIP REPLACEMENT ;  Surgeon: Taylor Rossetti, MD;  Location: [facility];  Service: Orthopedics;  Laterality: Right;    Current Medications: Current Meds  Medication Sig   acetaminophen (TYLENOL)  325 MG tablet Take 650 mg by mouth 3 (three) times daily.   apixaban (ELIQUIS) 5 MG TABS tablet Take 5 mg by mouth 2 (two) times daily.   aspirin 325 MG tablet Take 325 mg by mouth daily.   atorvastatin (LIPITOR) 10 MG tablet Take 10 mg by mouth at bedtime.   carbidopa-levodopa (SINEMET IR) 25-100 MG tablet Take 1 tablet by mouth 3 (three) times daily.   docusate sodium (COLACE) 100 MG capsule Take 1 capsule (100 mg total) by mouth 2 (two) times daily.   ferrous sulfate 325 (65 FE) MG tablet Take 1 tablet (325 mg total) by mouth daily with breakfast. (Patient taking differently: Take 325 mg by mouth daily with breakfast. Per note taking on Mon, Wed, and Fri.)   furosemide (LASIX) 20 MG tablet Take 20 mg by mouth daily as needed for fluid.   HYDROcodone-acetaminophen (NORCO/VICODIN) 5-325 MG tablet Take 1 tablet by mouth every 6 (six) hours as needed for moderate pain.   melatonin 1 MG TABS tablet Take 2 mg by mouth daily as needed (sleep).   methocarbamol (ROBAXIN) 500 MG tablet Take 250 mg by mouth at bedtime as needed for muscle spasms. Avoid Norco use only for moderate pain.   metoCLOPramide (REGLAN) 5 MG tablet Take 5 mg by mouth 2 (two) times daily.   metoprolol tartrate 37.5 MG TABS Take 37.5 mg by mouth 2 (two) times daily.   pantoprazole (PROTONIX) 20  MG tablet Take 20 mg by mouth daily.   polyethylene glycol (MIRALAX / GLYCOLAX) 17 g packet Take 17 g by mouth daily.     Allergies:   Amlodipine, Alendronate, and Ramipril   Social History   Socioeconomic History   Marital status: Married    Spouse name: Not on file   Number of children: Not on file   Years of education: Not on file   Highest education level: Not on file  Occupational History   Not on file  Tobacco Use   Smoking status: Never   Smokeless tobacco: Never  Vaping Use   Vaping Use: Never used  Substance and Sexual Activity   Alcohol use: No   Drug use: No   Sexual activity: Not on file  Other Topics Concern    Not on file  Social History Narrative   Right handed    [facility]   Social Determinants of Health   Financial Resource Strain: Not on file  Food Insecurity: Not on file  Transportation Needs: Not on file  Physical Activity: Not on file  Stress: Not on file  Social Connections: Not on file     Family History: The patient's family history includes Osteoporosis in her mother; Skin cancer in her mother; Stroke in her brother and father.  ROS:   Please see the history of present illness.    All other systems reviewed and are negative.  EKGs/Labs/Other Studies Reviewed:    The following studies were reviewed today: 2D echocardiogram 07/04/2022: 1. Left ventricular ejection fraction, by estimation, is 50%. The left  ventricle has mildly decreased function. The left ventricle demonstrates  global hypokinesis. There is mild concentric left ventricular hypertrophy.  Left ventricular diastolic  parameters are consistent with Grade I diastolic dysfunction (impaired  relaxation).   2. Right ventricular systolic function is normal. The right ventricular  size is normal. There is moderately elevated pulmonary artery systolic  pressure. The estimated right ventricular systolic pressure is 65.0 mmHg.   3. Left atrial size was moderately dilated.   4. Right atrial size was mildly dilated.   5. The mitral valve is normal in structure. Mild mitral valve  regurgitation. No evidence of mitral stenosis.   6. The aortic valve is tricuspid. There is mild calcification of the  aortic valve. Aortic valve regurgitation is moderate.   7. Aortic dilatation noted. There is severe dilatation of the ascending  aorta, measuring 52 mm.   8. The inferior vena cava is dilated in size with <50% respiratory  variability, suggesting right atrial pressure of 15 mmHg.   9. A small pericardial effusion is present.    Recent Labs: 07/04/2022: ALT 6 07/23/2022: B Natriuretic Peptide 486.2; Magnesium 1.9; TSH  0.434 07/24/2022: BUN 18; Creatinine, Ser 1.12; Hemoglobin 9.5; Platelets 293; Potassium 4.1; Sodium 138  Recent Lipid Panel    Component Value Date/Time   CHOL 145 09/22/2009 1016   TRIG 97.0 09/22/2009 1016   HDL 52.60 09/22/2009 1016   CHOLHDL 3 09/22/2009 1016   VLDL 19.4 09/22/2009 1016   LDLCALC 73 09/22/2009 1016     Risk Assessment/Calculations:                Physical Exam:    VS:  BP 100/70   Pulse 96   Ht '5\' 6"'$  (1.676 m)   Wt 116 lb 3.2 oz (52.7 kg)   SpO2 99%   BMI 18.76 kg/m     Wt Readings from Last 3 Encounters:  08/06/22 116 lb 3.2 oz (52.7 kg)  07/26/22 125 lb (56.7 kg)  07/23/22 125 lb (56.7 kg)     GEN: Elderly, thin woman in no acute distress HEENT: Normal NECK: No JVD; No carotid bruits LYMPHATICS: No lymphadenopathy CARDIAC: RRR, no murmurs, rubs, gallops RESPIRATORY:  Clear to auscultation without rales, wheezing or rhonchi  ABDOMEN: Soft, non-tender, non-distended MUSCULOSKELETAL:  No edema; No deformity  SKIN: Warm and dry NEUROLOGIC:  Alert and oriented x 3 PSYCHIATRIC:  Normal affect   ASSESSMENT:    1. PAT (paroxysmal atrial tachycardia) ([place])   2. Coronary artery disease involving native coronary artery of native heart without angina pectoris    PLAN:    In order of problems listed above:  No recurrence on an increased dose of metoprolol to tartrate.  We will continue the same.  The patient is wearing a ZIO monitor and will mail it in when she is done.  I will review her study when it is available.  We will continue apixaban for now.  Might need longer-term apixaban if we find atrial fibrillation.  Otherwise she will probably be able to take a 69-monthcourse following DVT/PE. Stable with no symptoms of angina at this time.      Medication Adjustments/Labs and Tests Ordered: Current medicines are reviewed at length with the patient today.  Concerns regarding medicines are outlined above.  No orders of the defined types were  placed in this encounter.  No orders of the defined types were placed in this encounter.   Patient Instructions  Medication Instructions:  Your physician recommends that you continue on your current medications as directed. Please refer to the Current Medication list given to you today.  *If you need a refill on your cardiac medications before your next appointment, please call your pharmacy*   Lab Work: NONE If you have labs (blood work) drawn today and your tests are completely normal, you will receive your results only by: [place](if you have MyChart) OR A paper copy in the mail If you have any lab test that is abnormal or we need to change your treatment, we will call you to review the results.   Testing/Procedures: NONE (Will await results of Zio monitor)   Follow-Up: [facility] you and your health needs are our priority.  As part of our continuing mission to provide you with exceptional heart care, we have created designated Provider Care Teams.  These Care Teams include your primary Cardiologist (physician) and Advanced Practice Providers (APPs -  Physician Assistants and Nurse Practitioners) who all work together to provide you with the care you need, when you need it.  We recommend signing up for the patient portal called "MyChart".  Sign up information is provided on this After Visit Summary.  MyChart is used to connect with patients for Virtual Visits (Telemedicine).  Patients are able to view lab/test results, encounter notes, upcoming appointments, etc.  Non-urgent messages can be sent to your provider as well.   To learn more about what you can do with MyChart, go to [email or web address]    Your next appointment:   6 month(s)  The format for your next appointment:   In Person  Provider:   MSherren Mocha MD       Important Information About Sugar         Signed, MSherren Mocha MD  08/06/2022 3:36 PM    [place]

## 2022-08-06 NOTE — Patient Instructions (Signed)
Medication Instructions:  Your physician recommends that you continue on your current medications as directed. Please refer to the Current Medication list given to you today.  *If you need a refill on your cardiac medications before your next appointment, please call your pharmacy*   Lab Work: NONE If you have labs (blood work) drawn today and your tests are completely normal, you will receive your results only by: [place] (if you have MyChart) OR A paper copy in the mail If you have any lab test that is abnormal or we need to change your treatment, we will call you to review the results.   Testing/Procedures: NONE (Will await results of Zio monitor)   Follow-Up: At [facility], you and your health needs are our priority.  As part of our continuing mission to provide you with exceptional heart care, we have created designated Provider Care Teams.  These Care Teams include your primary Cardiologist (physician) and Advanced Practice Providers (APPs -  Physician Assistants and Nurse Practitioners) who all work together to provide you with the care you need, when you need it.  We recommend signing up for the patient portal called "MyChart".  Sign up information is provided on this After Visit Summary.  MyChart is used to connect with patients for Virtual Visits (Telemedicine).  Patients are able to view lab/test results, encounter notes, upcoming appointments, etc.  Non-urgent messages can be sent to your provider as well.   To learn more about what you can do with MyChart, go to [email or web address].    Your next appointment:   6 month(s)  The format for your next appointment:   In Person  Provider:   Sherren Mocha, MD       Important Information About Sugar

## 2022-08-08 ENCOUNTER — Non-Acute Institutional Stay (SKILLED_NURSING_FACILITY): Payer: PPO | Admitting: Nurse Practitioner

## 2022-08-08 ENCOUNTER — Encounter: Payer: Self-pay | Admitting: Nurse Practitioner

## 2022-08-08 DIAGNOSIS — I480 Paroxysmal atrial fibrillation: Secondary | ICD-10-CM

## 2022-08-08 DIAGNOSIS — S72001A Fracture of unspecified part of neck of right femur, initial encounter for closed fracture: Secondary | ICD-10-CM | POA: Diagnosis not present

## 2022-08-08 DIAGNOSIS — E785 Hyperlipidemia, unspecified: Secondary | ICD-10-CM

## 2022-08-08 DIAGNOSIS — I2510 Atherosclerotic heart disease of native coronary artery without angina pectoris: Secondary | ICD-10-CM | POA: Diagnosis not present

## 2022-08-08 DIAGNOSIS — I2699 Other pulmonary embolism without acute cor pulmonale: Secondary | ICD-10-CM

## 2022-08-08 DIAGNOSIS — D649 Anemia, unspecified: Secondary | ICD-10-CM | POA: Diagnosis not present

## 2022-08-08 DIAGNOSIS — N1832 Chronic kidney disease, stage 3b: Secondary | ICD-10-CM

## 2022-08-08 DIAGNOSIS — G20 Parkinson's disease: Secondary | ICD-10-CM

## 2022-08-08 DIAGNOSIS — G20A1 Parkinson's disease without dyskinesia, without mention of fluctuations: Secondary | ICD-10-CM

## 2022-08-08 NOTE — Assessment & Plan Note (Signed)
PE-left peripheral lung on CTA 07/24/22/Acute DVT of femoral vein of RLE 07/19/22, on Eliquis

## 2022-08-08 NOTE — Assessment & Plan Note (Signed)
takes Atorvastatin  

## 2022-08-08 NOTE — Assessment & Plan Note (Signed)
Bun/creat 18/1.12 07/24/22

## 2022-08-08 NOTE — Progress Notes (Signed)
Location:   SNF [place] Room Number: 54 Place of Service:  SNF (31)  Provider: Marlana Latus NP  PCP: [name], MD Patient Care Team: [name], MD as PCP - General (Internal Medicine) Sherren Mocha, MD as PCP - Cardiology (Cardiology) [name], MD (Internal Medicine) Tat, Eustace Quail, DO as Consulting Physician (Neurology)  Extended Emergency Contact Information Primary Emergency Contact: Felipa Eth Home Phone: [phone] Mobile Phone: [phone] Relation: Daughter  Code Status: DNR Goals of care:  Advanced Directive information    08/08/2022    3:43 PM  Advanced Directives  Does Patient Have a Medical Advance Directive? No  Would patient like information on creating a medical advance directive? No - Guardian declined     Allergies  Allergen Reactions   Amlodipine Anaphylaxis, Swelling and Other (See Comments)    Edema   Alendronate Other (See Comments)    Dysphagia   Ramipril Cough    Chief Complaint  Patient presents with   Acute Visit    Discharge     HPI:  88 y.o. female with medical history significant for Afib, PE, CAD, OA, CKD, Parkinson's disease, and HLD was admitted to SNF [facility] for therpay following her hospital stay  The patient has regained her physical strength, ADL function, she is dynamically stale to return her IL she resided prior.    Hospitalized 07/23/22-07/24/22  Afib, acute PE and tachycardia.              Afib vs SVT, SR up dc, on ZIO, Junctional rhythm in preop, echo mod AR, f/u Cardiology             CAD s/p stent             Post op anemia, on Iron, FE 47 07/23/22, Hgb 9.5 07/24/22<<9.2 07/25/22             PE-left peripheral lung on CTA 07/24/22/Acute DVT of femoral vein of RLE 07/19/22, on Eliquis             S/p R hip hemiarthroplasty, takes Tylenol, prn Norco, prn Robaxin, WBAT, on Eliquis, ambulates with walker.              CKD Bun/creat 18/1.12 07/24/22             Parkinson's disease takes Sinemet, not  disabling.              HLD takes Atorvastatin             HTN, takes Metoprolol             GERD takes Pantoprazole, Reglan             Constipation, takes MiraLax daily.       Past Medical History:  Diagnosis Date   Coronary artery disease    Methicillin resistant Staphylococcus aureus in conditions classified elsewhere and of unspecified site    MI (myocardial infarction) ([place])    Other and unspecified hyperlipidemia    Unspecified essential hypertension     Past Surgical History:  Procedure Laterality Date   CATARACT EXTRACTION, BILATERAL     CORONARY ANGIOPLASTY WITH STENT PLACEMENT     HIP ARTHROPLASTY Right 07/04/2022   Procedure: RIGHT ANTERIOR HIP REPLACEMENT ;  Surgeon: Mcarthur Rossetti, MD;  Location: [facility];  Service: Orthopedics;  Laterality: Right;      reports that she has never smoked. She has never used smokeless tobacco. She reports that she does not drink alcohol and  does not use drugs. Social History   Socioeconomic History   Marital status: Married    Spouse name: Not on file   Number of children: Not on file   Years of education: Not on file   Highest education level: Not on file  Occupational History   Not on file  Tobacco Use   Smoking status: Never   Smokeless tobacco: Never  Vaping Use   Vaping Use: Never used  Substance and Sexual Activity   Alcohol use: No   Drug use: No   Sexual activity: Not on file  Other Topics Concern   Not on file  Social History Narrative   Right handed    [facility]   Social Determinants of Health   Financial Resource Strain: Not on file  Food Insecurity: Not on file  Transportation Needs: Not on file  Physical Activity: Not on file  Stress: Not on file  Social Connections: Not on file  Intimate Partner Violence: Not on file   Functional Status Survey:    Allergies  Allergen Reactions   Amlodipine Anaphylaxis, Swelling and Other (See Comments)    Edema   Alendronate Other (See Comments)     Dysphagia   Ramipril Cough    Pertinent  Health Maintenance Due  Topic Date Due   INFLUENZA VACCINE  07/09/2022   DEXA SCAN  Completed    Medications: Allergies as of 08/08/2022       Reactions   Amlodipine Anaphylaxis, Swelling, Other (See Comments)   Edema   Alendronate Other (See Comments)   Dysphagia   Ramipril Cough        Medication List        Accurate as of August 08, 2022 11:59 PM. If you have any questions, ask your nurse or doctor.          acetaminophen 325 MG tablet Commonly known as: TYLENOL Take 650 mg by mouth 3 (three) times daily.   apixaban 5 MG Tabs tablet Commonly known as: ELIQUIS Take 5 mg by mouth 2 (two) times daily.   apixaban 5 MG Tabs tablet Commonly known as: ELIQUIS Take 10 mg by mouth 2 (two) times daily.   aspirin 325 MG tablet Take 325 mg by mouth daily.   atorvastatin 10 MG tablet Commonly known as: LIPITOR Take 10 mg by mouth at bedtime.   carbidopa-levodopa 25-100 MG tablet Commonly known as: SINEMET IR Take 1 tablet by mouth 3 (three) times daily.   docusate sodium 100 MG capsule Commonly known as: COLACE Take 1 capsule (100 mg total) by mouth 2 (two) times daily.   ferrous sulfate 325 (65 FE) MG tablet Take 1 tablet (325 mg total) by mouth daily with breakfast. What changed: additional instructions   furosemide 20 MG tablet Commonly known as: LASIX Take 20 mg by mouth daily as needed for fluid.   HYDROcodone-acetaminophen 5-325 MG tablet Commonly known as: NORCO/VICODIN Take 1 tablet by mouth every 6 (six) hours as needed for moderate pain.   melatonin 1 MG Tabs tablet Take 2 mg by mouth daily as needed (sleep).   methocarbamol 500 MG tablet Commonly known as: ROBAXIN Take 250 mg by mouth at bedtime as needed for muscle spasms. Avoid Norco use only for moderate pain.   Metoprolol Tartrate 37.5 MG Tabs Take 37.5 mg by mouth 2 (two) times daily.   nitroGLYCERIN 0.4 MG SL tablet Commonly known as:  NITROSTAT Place 1 tablet (0.4 mg total) under the tongue every 5 (five) minutes  as needed for chest pain.   pantoprazole 20 MG tablet Commonly known as: PROTONIX Take 20 mg by mouth daily.   polyethylene glycol 17 g packet Commonly known as: MIRALAX / GLYCOLAX Take 17 g by mouth daily.        Review of Systems  Constitutional:  Negative for appetite change, fatigue and fever.  HENT:  Positive for hearing loss. Negative for congestion.   Eyes:  Negative for visual disturbance.  Respiratory:  Negative for cough, chest tightness, shortness of breath and wheezing.   Cardiovascular:  Negative for leg swelling.  Gastrointestinal:  Negative for abdominal pain and constipation.  Genitourinary:  Negative for dysuria, frequency and urgency.  Musculoskeletal:  Positive for arthralgias and gait problem.  Skin:  Negative for color change.  Neurological:  Negative for tremors, speech difficulty, weakness and headaches.  Psychiatric/Behavioral:  Positive for dysphoric mood. Negative for behavioral problems and sleep disturbance. The patient is not nervous/anxious.     Vitals:   08/08/22 1613  BP: (!) 139/54  Pulse: (!) 54  Resp: 18  Temp: 98 F (36.7 C)  SpO2: 97%  Weight: 118 lb (53.5 kg)  Height: '5\' 6"'$  (1.676 m)   Body mass index is 19.05 kg/m. Physical Exam Vitals and nursing note reviewed.  Constitutional:      Appearance: Normal appearance.  HENT:     Head: Normocephalic and atraumatic.     Nose: Nose normal.     Mouth/Throat:     Mouth: Mucous membranes are moist.  Eyes:     Extraocular Movements: Extraocular movements intact.     Conjunctiva/sclera: Conjunctivae normal.     Pupils: Pupils are equal, round, and reactive to light.  Cardiovascular:     Rate and Rhythm: Normal rate and regular rhythm.     Heart sounds: No murmur heard. Pulmonary:     Effort: Pulmonary effort is normal.     Breath sounds: No rales.  Abdominal:     General: Bowel sounds are normal.      Palpations: Abdomen is soft.     Tenderness: There is no abdominal tenderness.  Musculoskeletal:     Cervical back: Normal range of motion and neck supple.     Right lower leg: No edema.     Left lower leg: No edema.  Skin:    General: Skin is warm and dry.  Neurological:     General: No focal deficit present.     Mental Status: She is alert and oriented to person, place, and time. Mental status is at baseline.     Gait: Gait abnormal.  Psychiatric:        Mood and Affect: Mood normal.        Behavior: Behavior normal.        Thought Content: Thought content normal.        Judgment: Judgment normal.     Labs reviewed: Basic Metabolic Panel: Recent Labs    07/05/22 0335 07/06/22 0403 07/23/22 1118 07/24/22 0309  NA 142 139 141 138  K 3.4* 4.3 4.8 4.1  CL 110 109 111 110  CO2 24 24 21* 20*  GLUCOSE 96 98 88 79  BUN 52* 36* 17 18  CREATININE 1.49* 1.29* 1.12* 1.12*  CALCIUM 7.8* 8.2* 8.5* 8.4*  MG 1.9 1.8 1.9  --    Liver Function Tests: Recent Labs    07/04/22 0340  AST 18  ALT 6  ALKPHOS 74  BILITOT 0.7  PROT 6.1*  ALBUMIN 3.3*  No results for input(s): "LIPASE", "AMYLASE" in the last 8760 hours. No results for input(s): "AMMONIA" in the last 8760 hours. CBC: Recent Labs    07/05/22 0335 07/06/22 0403 07/23/22 1118 07/24/22 0309  WBC 6.6 6.2 7.3 4.6  NEUTROABS 4.7 4.6 6.1  --   HGB 9.6* 9.3* 9.9* 9.5*  HCT 29.5* 28.7* 30.6* 32.0*  MCV 89.4 89.7 92.4 100.3*  PLT 145* 144* 357 293   Cardiac Enzymes: Recent Labs    07/03/22 1552  CKTOTAL 118   BNP: Invalid input(s): "POCBNP" CBG: Recent Labs    07/05/22 1738  GLUCAP 69*    Procedures and Imaging Studies During Stay: CT Angio Chest PE W and/or Wo Contrast  Result Date: 07/23/2022 CLINICAL DATA:  Shortness of breath. EXAM: CT ANGIOGRAPHY CHEST WITH CONTRAST TECHNIQUE: Multidetector CT imaging of the chest was performed using the standard protocol during bolus administration of intravenous  contrast. Multiplanar CT image reconstructions and MIPs were obtained to evaluate the vascular anatomy. RADIATION DOSE REDUCTION: This exam was performed according to the departmental dose-optimization program which includes automated exposure control, adjustment of the mA and/or kV according to patient size and/or use of iterative reconstruction technique. CONTRAST:  100 mL Omnipaque 350 intravenously. COMPARISON:  June 01, 2015. FINDINGS: Cardiovascular: Small filling defect is seen in lower lobe branch of right pulmonary artery consistent with small pulmonary embolus. 5.4 cm ascending thoracic aortic aneurysm is noted. Mild cardiomegaly is noted. No pericardial effusion is noted. Mediastinum/Nodes: Moderate size hiatal hernia is noted. Thyroid gland is unremarkable. No adenopathy is noted. Lungs/Pleura: No pneumothorax is noted. Small left pleural effusion is noted with minimal adjacent subsegmental atelectasis. Upper Abdomen: No acute abnormality. Musculoskeletal: Old lower thoracic compression fracture is noted. No acute osseous abnormality is noted. Review of the MIP images confirms the above findings. IMPRESSION: Small peripheral pulmonary embolus is noted in lower lobe branch of right pulmonary artery. Critical Value/emergent results were called by telephone at the time of interpretation on 07/23/2022 at 1:44 pm to provider Georgina Snell , who verbally acknowledged these results. 5.4 cm ascending thoracic aortic aneurysm. Recommend semi-annual imaging followup by CTA or MRA and referral to cardiothoracic surgery if not already obtained. This recommendation follows 2010 ACCF/AHA/AATS/ACR/ASA/SCA/SCAI/SIR/STS/SVM Guidelines for the Diagnosis and Management of Patients With Thoracic Aortic Disease. Circulation. 2010; 121: Q469-G29. Aortic aneurysm NOS (ICD10-I71.9). Small left pleural effusion is noted with minimal adjacent subsegmental atelectasis. Moderate size hiatal hernia is noted. Aortic Atherosclerosis  (ICD10-I70.0). Electronically Signed   By: Marijo Conception M.D.   On: 07/23/2022 13:44   DG Chest Portable 1 View  Result Date: 07/23/2022 CLINICAL DATA:  Dyspnea. EXAM: PORTABLE CHEST 1 VIEW COMPARISON:  Chest radiograph 07/03/2022 FINDINGS: Stable enlargement of the cardiac silhouette. Hazy densities at the left lung base. Otherwise, the lungs are clear. Negative for a pneumothorax. No acute bone abnormality. IMPRESSION: No acute chest findings. Mild haziness at the left lung base probably represents overlying structures. Electronically Signed   By: Markus Daft M.D.   On: 07/23/2022 11:11    Assessment/Plan:   A-fib (HCC) Heart rate is in control,   Afib vs SVT, SR up dc, on ZIO, Junctional rhythm in preop, echo mod AR, f/u Cardiology  CAD, NATIVE VESSEL S/p sent  Normocytic anemia Post op anemia, on Iron, FE 47 07/23/22, Hgb 9.5 07/24/22<<9.2 07/25/22  Acute pulmonary embolism (HCC)  PE-left peripheral lung on CTA 07/24/22/Acute DVT of femoral vein of RLE 07/19/22, on Eliquis  Displaced fracture of right femoral  neck ([place]) S/p R hip hemiarthroplasty, takes Tylenol, prn Norco, prn Robaxin, WBAT, on Eliquis, ambulates with walker  CKD (chronic kidney disease), stage III ([place]) Bun/creat 18/1.12 07/24/22  Parkinson's disease ([place])  takes Sinemet, not disabling.   Hyperlipidemia takes Atorvastatin   Patient is being discharged with the following home health services:    Patient is being discharged with the following durable medical equipment:    Patient has been advised to f/u with their PCP in 1-2 weeks to bring them up to date on their rehab stay.  Social services at facility was responsible for arranging this appointment.  Pt was provided with a 30 day supply of prescriptions for medications and refills must be obtained from their PCP.  For controlled substances, a more limited supply may be provided adequate until PCP appointment only.  Future labs/tests needed:  prn

## 2022-08-08 NOTE — Assessment & Plan Note (Signed)
Heart rate is in control,   Afib vs SVT, SR up dc, on ZIO, Junctional rhythm in preop, echo mod AR, f/u Cardiology

## 2022-08-08 NOTE — Assessment & Plan Note (Signed)
takes Sinemet, not disabling.

## 2022-08-08 NOTE — Progress Notes (Signed)
This encounter was created in error - please disregard.

## 2022-08-08 NOTE — Assessment & Plan Note (Signed)
Post op anemia, on Iron, FE 47 07/23/22, Hgb 9.5 07/24/22<<9.2 07/25/22

## 2022-08-08 NOTE — Assessment & Plan Note (Signed)
S/p sent

## 2022-08-08 NOTE — Assessment & Plan Note (Signed)
S/p R hip hemiarthroplasty, takes Tylenol, prn Norco, prn Robaxin, WBAT, on Eliquis, ambulates with walker

## 2022-08-09 ENCOUNTER — Encounter: Payer: Self-pay | Admitting: Orthopedic Surgery

## 2022-08-09 ENCOUNTER — Encounter: Payer: Self-pay | Admitting: Nurse Practitioner

## 2022-08-09 DIAGNOSIS — R41841 Cognitive communication deficit: Secondary | ICD-10-CM | POA: Diagnosis not present

## 2022-08-09 DIAGNOSIS — Z9181 History of falling: Secondary | ICD-10-CM | POA: Diagnosis not present

## 2022-08-09 DIAGNOSIS — R278 Other lack of coordination: Secondary | ICD-10-CM | POA: Diagnosis not present

## 2022-08-09 DIAGNOSIS — R2689 Other abnormalities of gait and mobility: Secondary | ICD-10-CM | POA: Diagnosis not present

## 2022-08-09 DIAGNOSIS — M6281 Muscle weakness (generalized): Secondary | ICD-10-CM | POA: Diagnosis not present

## 2022-08-09 DIAGNOSIS — R2681 Unsteadiness on feet: Secondary | ICD-10-CM | POA: Diagnosis not present

## 2022-08-09 DIAGNOSIS — S72001D Fracture of unspecified part of neck of right femur, subsequent encounter for closed fracture with routine healing: Secondary | ICD-10-CM | POA: Diagnosis not present

## 2022-08-09 NOTE — Progress Notes (Signed)
Error

## 2022-08-09 NOTE — Progress Notes (Unsigned)
Location:  [place] Room Number: [employer] of Service:  [id number]) Provider: Windell Moulding, NP  Code Status: DNR Goals of Care:     08/09/2022   12:31 PM  Advanced Directives  Does Patient Have a Medical Advance Directive? Yes  Type of Advance Directive Out of facility DNR (pink MOST or yellow form)  Does patient want to make changes to medical advance directive? No - Patient declined     Chief Complaint  Patient presents with   Hospitalization Follow-up    Hospital follow up.     HPI: Patient is a 88 y.o. female seen today for hospital follow-up s/p admission from   Past Medical History:  Diagnosis Date   Coronary artery disease    Methicillin resistant Staphylococcus aureus in conditions classified elsewhere and of unspecified site    MI (myocardial infarction) ([place])    Other and unspecified hyperlipidemia    Unspecified essential hypertension     Past Surgical History:  Procedure Laterality Date   CATARACT EXTRACTION, BILATERAL     CORONARY ANGIOPLASTY WITH STENT PLACEMENT     HIP ARTHROPLASTY Right 07/04/2022   Procedure: RIGHT ANTERIOR HIP REPLACEMENT ;  Surgeon: Mcarthur Rossetti, MD;  Location: [facility];  Service: Orthopedics;  Laterality: Right;    Allergies  Allergen Reactions   Norvasc [Amlodipine] Anaphylaxis, Swelling and Other (See Comments)    Edema   Altace [Ramipril] Cough   Fosamax [Alendronate] Other (See Comments)    Dysphagia    Outpatient Encounter Medications as of 08/09/2022  Medication Sig   acetaminophen (TYLENOL) 325 MG tablet Take 650 mg by mouth 3 (three) times daily.   apixaban (ELIQUIS) 5 MG TABS tablet Take 5 mg by mouth 2 (two) times daily.   atorvastatin (LIPITOR) 10 MG tablet Take 10 mg by mouth at bedtime.   carbidopa-levodopa (SINEMET IR) 25-100 MG tablet Take 1 tablet by mouth 3 (three) times daily.   docusate sodium (COLACE) 100 MG capsule Take 1 capsule (100 mg total) by mouth 2 (two) times daily.    ferrous sulfate 325 (65 FE) MG tablet Take 325 mg by mouth 3 (three) times a week. Monday, Wednesday, and Friday.   furosemide (LASIX) 20 MG tablet Take 20 mg by mouth daily as needed for fluid.   melatonin 1 MG TABS tablet Take 2 mg by mouth daily.   methocarbamol (ROBAXIN) 500 MG tablet Take 250 mg by mouth at bedtime as needed for muscle spasms. Avoid Norco use only for moderate pain.   metoprolol tartrate 37.5 MG TABS Take 37.5 mg by mouth 2 (two) times daily.   nitroGLYCERIN (NITROSTAT) 0.4 MG SL tablet Place 1 tablet (0.4 mg total) under the tongue every 5 (five) minutes as needed for chest pain.   pantoprazole (PROTONIX) 20 MG tablet Take 20 mg by mouth daily.   polyethylene glycol (MIRALAX / GLYCOLAX) 17 g packet Take 17 g by mouth daily.   zinc oxide 20 % ointment Apply 1 Application topically as needed for irritation.   apixaban (ELIQUIS) 5 MG TABS tablet Take 10 mg by mouth 2 (two) times daily.   [DISCONTINUED] aspirin 325 MG tablet Take 325 mg by mouth daily.   [DISCONTINUED] ferrous sulfate 325 (65 FE) MG tablet Take 1 tablet (325 mg total) by mouth daily with breakfast. (Patient taking differently: Take 325 mg by mouth daily with breakfast. Per note taking on Mon, Wed, and Fri.)   [DISCONTINUED] HYDROcodone-acetaminophen (NORCO/VICODIN) 5-325 MG tablet Take  1 tablet by mouth every 6 (six) hours as needed for moderate pain.   No facility-administered encounter medications on file as of 08/09/2022.    Review of Systems:  Review of Systems  Health Maintenance  Topic Date Due   COVID-19 Vaccine (5 - Mixed Product series) 07/11/2021   INFLUENZA VACCINE  07/09/2022   TETANUS/TDAP  06/14/2026   Pneumonia Vaccine 47+ Years old  Completed   DEXA SCAN  Completed   Zoster Vaccines- Shingrix  Completed   HPV VACCINES  Aged Out    Physical Exam: Vitals:   08/09/22 1224  BP: 134/88  Pulse: (!) 54  Resp: (!) 22  Temp: (!) 96.5 F (35.8 C)  SpO2: 95%  Weight: 118 lb (53.5 kg)   Height: '5\' 6"'$  (1.676 m)   Body mass index is 19.05 kg/m. Physical Exam  Labs reviewed: Basic Metabolic Panel: Recent Labs    07/05/22 0335 07/06/22 0403 07/23/22 1118 07/24/22 0309  NA 142 139 141 138  K 3.4* 4.3 4.8 4.1  CL 110 109 111 110  CO2 24 24 21* 20*  GLUCOSE 96 98 88 79  BUN 52* 36* 17 18  CREATININE 1.49* 1.29* 1.12* 1.12*  CALCIUM 7.8* 8.2* 8.5* 8.4*  MG 1.9 1.8 1.9  --   TSH  --   --  0.434  --    Liver Function Tests: Recent Labs    07/04/22 0340  AST 18  ALT 6  ALKPHOS 74  BILITOT 0.7  PROT 6.1*  ALBUMIN 3.3*   No results for input(s): "LIPASE", "AMYLASE" in the last 8760 hours. No results for input(s): "AMMONIA" in the last 8760 hours. CBC: Recent Labs    07/05/22 0335 07/06/22 0403 07/23/22 1118 07/24/22 0309  WBC 6.6 6.2 7.3 4.6  NEUTROABS 4.7 4.6 6.1  --   HGB 9.6* 9.3* 9.9* 9.5*  HCT 29.5* 28.7* 30.6* 32.0*  MCV 89.4 89.7 92.4 100.3*  PLT 145* 144* 357 293   Lipid Panel: No results for input(s): "CHOL", "HDL", "LDLCALC", "TRIG", "CHOLHDL", "LDLDIRECT" in the last 8760 hours. No results found for: "HGBA1C"  Procedures since last visit: CT Angio Chest PE W and/or Wo Contrast  Result Date: 07/23/2022 CLINICAL DATA:  Shortness of breath. EXAM: CT ANGIOGRAPHY CHEST WITH CONTRAST TECHNIQUE: Multidetector CT imaging of the chest was performed using the standard protocol during bolus administration of intravenous contrast. Multiplanar CT image reconstructions and MIPs were obtained to evaluate the vascular anatomy. RADIATION DOSE REDUCTION: This exam was performed according to the departmental dose-optimization program which includes automated exposure control, adjustment of the mA and/or kV according to patient size and/or use of iterative reconstruction technique. CONTRAST:  100 mL Omnipaque 350 intravenously. COMPARISON:  June 01, 2015. FINDINGS: Cardiovascular: Small filling defect is seen in lower lobe branch of right pulmonary artery  consistent with small pulmonary embolus. 5.4 cm ascending thoracic aortic aneurysm is noted. Mild cardiomegaly is noted. No pericardial effusion is noted. Mediastinum/Nodes: Moderate size hiatal hernia is noted. Thyroid gland is unremarkable. No adenopathy is noted. Lungs/Pleura: No pneumothorax is noted. Small left pleural effusion is noted with minimal adjacent subsegmental atelectasis. Upper Abdomen: No acute abnormality. Musculoskeletal: Old lower thoracic compression fracture is noted. No acute osseous abnormality is noted. Review of the MIP images confirms the above findings. IMPRESSION: Small peripheral pulmonary embolus is noted in lower lobe branch of right pulmonary artery. Critical Value/emergent results were called by telephone at the time of interpretation on 07/23/2022 at 1:44 pm to provider VICTORIA  [facility] , who verbally acknowledged these results. 5.4 cm ascending thoracic aortic aneurysm. Recommend semi-annual imaging followup by CTA or MRA and referral to cardiothoracic surgery if not already obtained. This recommendation follows 2010 ACCF/AHA/AATS/ACR/ASA/SCA/SCAI/SIR/STS/SVM Guidelines for the Diagnosis and Management of Patients With Thoracic Aortic Disease. Circulation. 2010; 121: P498-Y64. Aortic aneurysm NOS (ICD10-I71.9). Small left pleural effusion is noted with minimal adjacent subsegmental atelectasis. Moderate size hiatal hernia is noted. Aortic Atherosclerosis (ICD10-I70.0). Electronically Signed   By: Marijo Conception M.D.   On: 07/23/2022 13:44   DG Chest Portable 1 View  Result Date: 07/23/2022 CLINICAL DATA:  Dyspnea. EXAM: PORTABLE CHEST 1 VIEW COMPARISON:  Chest radiograph 07/03/2022 FINDINGS: Stable enlargement of the cardiac silhouette. Hazy densities at the left lung base. Otherwise, the lungs are clear. Negative for a pneumothorax. No acute bone abnormality. IMPRESSION: No acute chest findings. Mild haziness at the left lung base probably represents overlying structures.  Electronically Signed   By: Markus Daft M.D.   On: 07/23/2022 11:11    Assessment/Plan There are no diagnoses linked to this encounter.   Labs/tests ordered:  * No order type specified * Next appt:  Visit date not found

## 2022-08-12 DIAGNOSIS — R278 Other lack of coordination: Secondary | ICD-10-CM | POA: Diagnosis not present

## 2022-08-12 DIAGNOSIS — M6281 Muscle weakness (generalized): Secondary | ICD-10-CM | POA: Diagnosis not present

## 2022-08-12 DIAGNOSIS — R41841 Cognitive communication deficit: Secondary | ICD-10-CM | POA: Diagnosis not present

## 2022-08-12 DIAGNOSIS — R2689 Other abnormalities of gait and mobility: Secondary | ICD-10-CM | POA: Diagnosis not present

## 2022-08-12 DIAGNOSIS — R2681 Unsteadiness on feet: Secondary | ICD-10-CM | POA: Diagnosis not present

## 2022-08-12 NOTE — Progress Notes (Signed)
This encounter was created in error - please disregard.

## 2022-08-13 DIAGNOSIS — M6281 Muscle weakness (generalized): Secondary | ICD-10-CM | POA: Diagnosis not present

## 2022-08-13 DIAGNOSIS — Z9181 History of falling: Secondary | ICD-10-CM | POA: Diagnosis not present

## 2022-08-13 DIAGNOSIS — R41841 Cognitive communication deficit: Secondary | ICD-10-CM | POA: Diagnosis not present

## 2022-08-13 DIAGNOSIS — S72001D Fracture of unspecified part of neck of right femur, subsequent encounter for closed fracture with routine healing: Secondary | ICD-10-CM | POA: Diagnosis not present

## 2022-08-14 DIAGNOSIS — R41841 Cognitive communication deficit: Secondary | ICD-10-CM | POA: Diagnosis not present

## 2022-08-14 DIAGNOSIS — R2681 Unsteadiness on feet: Secondary | ICD-10-CM | POA: Diagnosis not present

## 2022-08-14 DIAGNOSIS — R2689 Other abnormalities of gait and mobility: Secondary | ICD-10-CM | POA: Diagnosis not present

## 2022-08-14 DIAGNOSIS — Z9181 History of falling: Secondary | ICD-10-CM | POA: Diagnosis not present

## 2022-08-14 DIAGNOSIS — R278 Other lack of coordination: Secondary | ICD-10-CM | POA: Diagnosis not present

## 2022-08-14 DIAGNOSIS — S72001D Fracture of unspecified part of neck of right femur, subsequent encounter for closed fracture with routine healing: Secondary | ICD-10-CM | POA: Diagnosis not present

## 2022-08-14 DIAGNOSIS — M6281 Muscle weakness (generalized): Secondary | ICD-10-CM | POA: Diagnosis not present

## 2022-08-15 ENCOUNTER — Encounter: Payer: Self-pay | Admitting: Internal Medicine

## 2022-08-15 DIAGNOSIS — R2681 Unsteadiness on feet: Secondary | ICD-10-CM | POA: Diagnosis not present

## 2022-08-15 DIAGNOSIS — S72001D Fracture of unspecified part of neck of right femur, subsequent encounter for closed fracture with routine healing: Secondary | ICD-10-CM | POA: Diagnosis not present

## 2022-08-15 DIAGNOSIS — R2689 Other abnormalities of gait and mobility: Secondary | ICD-10-CM | POA: Diagnosis not present

## 2022-08-15 DIAGNOSIS — Z9181 History of falling: Secondary | ICD-10-CM | POA: Diagnosis not present

## 2022-08-15 DIAGNOSIS — R41841 Cognitive communication deficit: Secondary | ICD-10-CM | POA: Diagnosis not present

## 2022-08-15 DIAGNOSIS — M6281 Muscle weakness (generalized): Secondary | ICD-10-CM | POA: Diagnosis not present

## 2022-08-15 DIAGNOSIS — R278 Other lack of coordination: Secondary | ICD-10-CM | POA: Diagnosis not present

## 2022-08-15 NOTE — Progress Notes (Unsigned)
Provider:  Veleta Miners, MD Location:   [place] Room Number: [place] of Service:  ALF ([phone])  PCP: Virgie Dad, MD Patient Care Team: Virgie Dad, MD as PCP - General (Internal Medicine) Sherren Mocha, MD as PCP - Cardiology (Cardiology) [name], MD (Internal Medicine) Tat, Eustace Quail, DO as Consulting Physician (Neurology)  Extended Emergency Contact Information Primary Emergency Contact: Felipa Eth Home Phone: [phone] Mobile Phone: [phone] Relation: Daughter  Code Status: DNR Goals of Care: Advanced Directive information    08/15/2022   11:01 AM  Advanced Directives  Does Patient Have a Medical Advance Directive? Yes  Type of Advance Directive Out of facility DNR (pink MOST or yellow form)  Does patient want to make changes to medical advance directive? No - Patient declined      Chief Complaint  Patient presents with   New Admission     New admission to AL    HPI: Patient is a 88 y.o. female seen today for admission to  Past Medical History:  Diagnosis Date   Coronary artery disease    Methicillin resistant Staphylococcus aureus in conditions classified elsewhere and of unspecified site    MI (myocardial infarction) ([place])    Other and unspecified hyperlipidemia    Unspecified essential hypertension    Past Surgical History:  Procedure Laterality Date   CATARACT EXTRACTION, BILATERAL     CORONARY ANGIOPLASTY WITH STENT PLACEMENT     HIP ARTHROPLASTY Right 07/04/2022   Procedure: RIGHT ANTERIOR HIP REPLACEMENT ;  Surgeon: Mcarthur Rossetti, MD;  Location: [facility];  Service: Orthopedics;  Laterality: Right;    reports that she has never smoked. She has never used smokeless tobacco. She reports that she does not drink alcohol and does not use drugs. Social History   Socioeconomic History   Marital status: Married    Spouse name: Not on file   Number of children: Not on file   Years of education: Not on  file   Highest education level: Not on file  Occupational History   Not on file  Tobacco Use   Smoking status: Never   Smokeless tobacco: Never  Vaping Use   Vaping Use: Never used  Substance and Sexual Activity   Alcohol use: No   Drug use: No   Sexual activity: Not on file  Other Topics Concern   Not on file  Social History Narrative   Right handed    [facility]   Social Determinants of Health   Financial Resource Strain: Not on file  Food Insecurity: Not on file  Transportation Needs: Not on file  Physical Activity: Not on file  Stress: Not on file  Social Connections: Not on file  Intimate Partner Violence: Not on file    Functional Status Survey:    Family History  Problem Relation Age of Onset   Osteoporosis Mother    Skin cancer Mother    Stroke Father    Stroke Brother     Health Maintenance  Topic Date Due   COVID-19 Vaccine (5 - Mixed Product series) 07/11/2021   INFLUENZA VACCINE  07/09/2022   TETANUS/TDAP  06/14/2026   Pneumonia Vaccine 59+ Years old  Completed   DEXA SCAN  Completed   Zoster Vaccines- Shingrix  Completed   HPV VACCINES  Aged Out    Allergies  Allergen Reactions   Norvasc [Amlodipine] Anaphylaxis, Swelling and Other (See Comments)    Edema   Altace [Ramipril] Cough  Fosamax [Alendronate] Other (See Comments)    Dysphagia    Allergies as of 08/15/2022       Reactions   Norvasc [amlodipine] Anaphylaxis, Swelling, Other (See Comments)   Edema   Altace [ramipril] Cough   Fosamax [alendronate] Other (See Comments)   Dysphagia        Medication List        Accurate as of August 15, 2022  2:24 PM. If you have any questions, ask your nurse or doctor.          acetaminophen 325 MG tablet Commonly known as: TYLENOL Take 650 mg by mouth 3 (three) times daily.   apixaban 5 MG Tabs tablet Commonly known as: ELIQUIS Take 5 mg by mouth 2 (two) times daily.   apixaban 5 MG Tabs tablet Commonly known as:  ELIQUIS Take 10 mg by mouth 2 (two) times daily.   atorvastatin 10 MG tablet Commonly known as: LIPITOR Take 10 mg by mouth at bedtime.   carbidopa-levodopa 25-100 MG tablet Commonly known as: SINEMET IR Take 1 tablet by mouth 3 (three) times daily.   docusate sodium 100 MG capsule Commonly known as: COLACE Take 1 capsule (100 mg total) by mouth 2 (two) times daily.   ferrous sulfate 325 (65 FE) MG tablet Take 325 mg by mouth 3 (three) times a week. Monday, Wednesday, and Friday.   furosemide 20 MG tablet Commonly known as: LASIX Take 20 mg by mouth daily as needed for fluid.   melatonin 1 MG Tabs tablet Take 2 mg by mouth daily.   methocarbamol 500 MG tablet Commonly known as: ROBAXIN Take 250 mg by mouth at bedtime as needed for muscle spasms. Avoid Norco use only for moderate pain.   Metoprolol Tartrate 37.5 MG Tabs Take 37.5 mg by mouth 2 (two) times daily.   nitroGLYCERIN 0.4 MG SL tablet Commonly known as: NITROSTAT Place 1 tablet (0.4 mg total) under the tongue every 5 (five) minutes as needed for chest pain.   pantoprazole 20 MG tablet Commonly known as: PROTONIX Take 20 mg by mouth daily.   polyethylene glycol 17 g packet Commonly known as: MIRALAX / GLYCOLAX Take 17 g by mouth daily.   zinc oxide 20 % ointment Apply 1 Application topically as needed for irritation.        Review of Systems  Vitals:   08/15/22 1040  BP: (!) 166/82  Pulse: 66  Resp: 18  Temp: 98.1 F (36.7 C)  SpO2: 96%  Weight: 118 lb (53.5 kg)  Height: '5\' 6"'$  (1.676 m)   Body mass index is 19.05 kg/m. Physical Exam  Labs reviewed: Basic Metabolic Panel: Recent Labs    07/05/22 0335 07/06/22 0403 07/23/22 1118 07/24/22 0309  NA 142 139 141 138  K 3.4* 4.3 4.8 4.1  CL 110 109 111 110  CO2 24 24 21* 20*  GLUCOSE 96 98 88 79  BUN 52* 36* 17 18  CREATININE 1.49* 1.29* 1.12* 1.12*  CALCIUM 7.8* 8.2* 8.5* 8.4*  MG 1.9 1.8 1.9  --    Liver Function Tests: Recent  Labs    07/04/22 0340  AST 18  ALT 6  ALKPHOS 74  BILITOT 0.7  PROT 6.1*  ALBUMIN 3.3*   No results for input(s): "LIPASE", "AMYLASE" in the last 8760 hours. No results for input(s): "AMMONIA" in the last 8760 hours. CBC: Recent Labs    07/05/22 0335 07/06/22 0403 07/23/22 1118 07/24/22 0309  WBC 6.6 6.2 7.3 4.6  NEUTROABS  4.7 4.6 6.1  --   HGB 9.6* 9.3* 9.9* 9.5*  HCT 29.5* 28.7* 30.6* 32.0*  MCV 89.4 89.7 92.4 100.3*  PLT 145* 144* 357 293   Cardiac Enzymes: Recent Labs    07/03/22 1552  CKTOTAL 118   BNP: Invalid input(s): "POCBNP" No results found for: "HGBA1C" Lab Results  Component Value Date   TSH 0.434 07/23/2022   No results found for: "VITAMINB12" No results found for: "FOLATE" Lab Results  Component Value Date   IRON 47 07/23/2022   TIBC 232 (L) 07/23/2022   FERRITIN 87 07/23/2022    Imaging and Procedures obtained prior to SNF admission: CT Angio Chest PE W and/or Wo Contrast  Result Date: 07/23/2022 CLINICAL DATA:  Shortness of breath. EXAM: CT ANGIOGRAPHY CHEST WITH CONTRAST TECHNIQUE: Multidetector CT imaging of the chest was performed using the standard protocol during bolus administration of intravenous contrast. Multiplanar CT image reconstructions and MIPs were obtained to evaluate the vascular anatomy. RADIATION DOSE REDUCTION: This exam was performed according to the departmental dose-optimization program which includes automated exposure control, adjustment of the mA and/or kV according to patient size and/or use of iterative reconstruction technique. CONTRAST:  100 mL Omnipaque 350 intravenously. COMPARISON:  June 01, 2015. FINDINGS: Cardiovascular: Small filling defect is seen in lower lobe branch of right pulmonary artery consistent with small pulmonary embolus. 5.4 cm ascending thoracic aortic aneurysm is noted. Mild cardiomegaly is noted. No pericardial effusion is noted. Mediastinum/Nodes: Moderate size hiatal hernia is noted. Thyroid  gland is unremarkable. No adenopathy is noted. Lungs/Pleura: No pneumothorax is noted. Small left pleural effusion is noted with minimal adjacent subsegmental atelectasis. Upper Abdomen: No acute abnormality. Musculoskeletal: Old lower thoracic compression fracture is noted. No acute osseous abnormality is noted. Review of the MIP images confirms the above findings. IMPRESSION: Small peripheral pulmonary embolus is noted in lower lobe branch of right pulmonary artery. Critical Value/emergent results were called by telephone at the time of interpretation on 07/23/2022 at 1:44 pm to provider Georgina Snell , who verbally acknowledged these results. 5.4 cm ascending thoracic aortic aneurysm. Recommend semi-annual imaging followup by CTA or MRA and referral to cardiothoracic surgery if not already obtained. This recommendation follows 2010 ACCF/AHA/AATS/ACR/ASA/SCA/SCAI/SIR/STS/SVM Guidelines for the Diagnosis and Management of Patients With Thoracic Aortic Disease. Circulation. 2010; 121: J941-D40. Aortic aneurysm NOS (ICD10-I71.9). Small left pleural effusion is noted with minimal adjacent subsegmental atelectasis. Moderate size hiatal hernia is noted. Aortic Atherosclerosis (ICD10-I70.0). Electronically Signed   By: Marijo Conception M.D.   On: 07/23/2022 13:44   DG Chest Portable 1 View  Result Date: 07/23/2022 CLINICAL DATA:  Dyspnea. EXAM: PORTABLE CHEST 1 VIEW COMPARISON:  Chest radiograph 07/03/2022 FINDINGS: Stable enlargement of the cardiac silhouette. Hazy densities at the left lung base. Otherwise, the lungs are clear. Negative for a pneumothorax. No acute bone abnormality. IMPRESSION: No acute chest findings. Mild haziness at the left lung base probably represents overlying structures. Electronically Signed   By: Markus Daft M.D.   On: 07/23/2022 11:11    Assessment/Plan There are no diagnoses linked to this encounter.   Family/ staff Communication:   Labs/tests ordered:

## 2022-08-16 DIAGNOSIS — M6281 Muscle weakness (generalized): Secondary | ICD-10-CM | POA: Diagnosis not present

## 2022-08-16 DIAGNOSIS — Z9181 History of falling: Secondary | ICD-10-CM | POA: Diagnosis not present

## 2022-08-16 DIAGNOSIS — S72001D Fracture of unspecified part of neck of right femur, subsequent encounter for closed fracture with routine healing: Secondary | ICD-10-CM | POA: Diagnosis not present

## 2022-08-16 DIAGNOSIS — R41841 Cognitive communication deficit: Secondary | ICD-10-CM | POA: Diagnosis not present

## 2022-08-19 ENCOUNTER — Non-Acute Institutional Stay: Payer: PPO | Admitting: Orthopedic Surgery

## 2022-08-19 ENCOUNTER — Encounter: Payer: Self-pay | Admitting: Orthopedic Surgery

## 2022-08-19 DIAGNOSIS — M25551 Pain in right hip: Secondary | ICD-10-CM

## 2022-08-19 DIAGNOSIS — I471 Supraventricular tachycardia: Secondary | ICD-10-CM | POA: Diagnosis not present

## 2022-08-19 DIAGNOSIS — W19XXXA Unspecified fall, initial encounter: Secondary | ICD-10-CM | POA: Diagnosis not present

## 2022-08-19 DIAGNOSIS — R278 Other lack of coordination: Secondary | ICD-10-CM | POA: Diagnosis not present

## 2022-08-19 DIAGNOSIS — R2681 Unsteadiness on feet: Secondary | ICD-10-CM | POA: Diagnosis not present

## 2022-08-19 DIAGNOSIS — M6281 Muscle weakness (generalized): Secondary | ICD-10-CM | POA: Diagnosis not present

## 2022-08-19 DIAGNOSIS — U071 COVID-19: Secondary | ICD-10-CM

## 2022-08-19 DIAGNOSIS — R2689 Other abnormalities of gait and mobility: Secondary | ICD-10-CM | POA: Diagnosis not present

## 2022-08-19 DIAGNOSIS — R000 Tachycardia, unspecified: Secondary | ICD-10-CM | POA: Diagnosis not present

## 2022-08-19 DIAGNOSIS — R41841 Cognitive communication deficit: Secondary | ICD-10-CM | POA: Diagnosis not present

## 2022-08-19 NOTE — Progress Notes (Unsigned)
Location:  [place] Room Number: 25/A Place of Service:  ALF [phone]) Provider: Yvonna Alanis, NP   Virgie Dad, MD  Patient Care Team: Virgie Dad, MD as PCP - General (Internal Medicine) Sherren Mocha, MD as PCP - Cardiology (Cardiology) [name], MD (Internal Medicine) Tat, Eustace Quail, DO as Consulting Physician (Neurology)  Extended Emergency Contact Information Primary Emergency Contact: Felipa Eth Home Phone: [phone] Mobile Phone: [phone] Relation: Daughter  Code Status:   Goals of care: Advanced Directive information    08/15/2022   11:01 AM  Advanced Directives  Does Patient Have a Medical Advance Directive? Yes  Type of Advance Directive Out of facility DNR (pink MOST or yellow form)  Does patient want to make changes to medical advance directive? No - Patient declined     Chief Complaint  Patient presents with   Acute Visit    Right hip pain    HPI:  Pt is a 88 y.o. female seen today for acute visit due to right hip pain.   Hospitalized 07/26-07/31 due to displaced right femoral neck fracture. 07/27 right anterior hemiarthroplasty by Dr. Ninfa Linden. She tolerated procedure well and was discharged to SNF for PT/OT. 08/31 she transitioned to [place].   09/09 she had a mechanical fall. She fell on right side. Portion of surgical incision opened. She reports right hip soreness x 2 days. She has not requested anything for pain. Bruising to right upper thigh also noted. She is able to ambulate with walker without difficulty. WBAT. Treatment options discussed. She is refusing to xray right hip/pelvis at this time. She is scheduled to follow up with Dr. Ninfa Linden 09/03/2022.   Afib-  junctional rhythm in prior to hip surgery, off ZIO patch 09/04, echo revealed moderate aortic regurgitation CAD-  s/p stent Post op anemia-  FE 47 07/23/22, Hgb 9.5 07/24/22<<9.2 07/25/22, remains on ferrous sulfate PE-left peripheral lung on CTA  07/24/22/Acute DVT of femoral vein of RLE 07/19/22, on Eliquis Parkinson's- followed by neurology, remains on sinemet               Past Medical History:  Diagnosis Date   Coronary artery disease    Methicillin resistant Staphylococcus aureus in conditions classified elsewhere and of unspecified site    MI (myocardial infarction) ([place])    Other and unspecified hyperlipidemia    Unspecified essential hypertension    Past Surgical History:  Procedure Laterality Date   CATARACT EXTRACTION, BILATERAL     CORONARY ANGIOPLASTY WITH STENT PLACEMENT     HIP ARTHROPLASTY Right 07/04/2022   Procedure: RIGHT ANTERIOR HIP REPLACEMENT ;  Surgeon: Mcarthur Rossetti, MD;  Location: [facility];  Service: Orthopedics;  Laterality: Right;    Allergies  Allergen Reactions   Norvasc [Amlodipine] Anaphylaxis, Swelling and Other (See Comments)    Edema   Altace [Ramipril] Cough   Fosamax [Alendronate] Other (See Comments)    Dysphagia    Outpatient Encounter Medications as of 08/19/2022  Medication Sig   acetaminophen (TYLENOL) 325 MG tablet Take 650 mg by mouth 3 (three) times daily.   apixaban (ELIQUIS) 5 MG TABS tablet Take 10 mg by mouth 2 (two) times daily.   apixaban (ELIQUIS) 5 MG TABS tablet Take 5 mg by mouth 2 (two) times daily.   atorvastatin (LIPITOR) 10 MG tablet Take 10 mg by mouth at bedtime.   carbidopa-levodopa (SINEMET IR) 25-100 MG tablet Take 1 tablet by mouth 3 (three) times daily.   docusate sodium (  COLACE) 100 MG capsule Take 1 capsule (100 mg total) by mouth 2 (two) times daily.   ferrous sulfate 325 (65 FE) MG tablet Take 325 mg by mouth 3 (three) times a week. Monday, Wednesday, and Friday.   furosemide (LASIX) 20 MG tablet Take 20 mg by mouth daily as needed for fluid.   melatonin 1 MG TABS tablet Take 2 mg by mouth daily.   methocarbamol (ROBAXIN) 500 MG tablet Take 250 mg by mouth at bedtime as needed for muscle spasms. Avoid Norco use only for moderate pain.    metoprolol tartrate 37.5 MG TABS Take 37.5 mg by mouth 2 (two) times daily.   nitroGLYCERIN (NITROSTAT) 0.4 MG SL tablet Place 1 tablet (0.4 mg total) under the tongue every 5 (five) minutes as needed for chest pain.   pantoprazole (PROTONIX) 20 MG tablet Take 20 mg by mouth daily.   polyethylene glycol (MIRALAX / GLYCOLAX) 17 g packet Take 17 g by mouth daily.   zinc oxide 20 % ointment Apply 1 Application topically as needed for irritation.   No facility-administered encounter medications on file as of 08/19/2022.    Review of Systems  Immunization History  Administered Date(s) Administered   Influenza Split 09/24/2010, 09/04/2011, 09/18/2012, 09/08/2013, 09/09/2015, 10/21/2016, 09/22/2019, 09/13/2021   Influenza-Unspecified 11/09/2014, 09/30/2015, 09/17/2016, 09/01/2017, 09/10/2018   Moderna Sars-Covid-2 Vaccination 12/13/2019, 01/09/2021, 05/16/2021   PFIZER(Purple Top)SARS-COV-2 Vaccination 01/10/2020   Pneumococcal Conjugate-13 04/18/2014   Pneumococcal Polysaccharide-23 09/20/2002, 09/23/2019   Td 10/11/2004   Tdap 06/14/2016   Zoster Recombinat (Shingrix) 02/10/2019, 05/04/2019   Zoster, Live 01/23/2009   Pertinent  Health Maintenance Due  Topic Date Due   INFLUENZA VACCINE  07/09/2022   DEXA SCAN  Completed      07/07/2022    9:25 PM 07/08/2022    9:00 AM 07/23/2022    9:46 AM 07/23/2022   10:00 PM 07/24/2022   10:01 AM  Fall Risk  Patient Fall Risk Level High fall risk High fall risk Moderate fall risk Moderate fall risk Low fall risk   Functional Status Survey:    Vitals:   08/19/22 1251  BP: (!) 97/57  Pulse: 63  Resp: 18  Temp: (!) 97.4 F (36.3 C)  SpO2: 96%  Weight: 118 lb (53.5 kg)  Height: '5\' 5"'$  (1.651 m)   Body mass index is 19.64 kg/m. Physical Exam  Labs reviewed: Recent Labs    07/05/22 0335 07/06/22 0403 07/23/22 1118 07/24/22 0309  NA 142 139 141 138  K 3.4* 4.3 4.8 4.1  CL 110 109 111 110  CO2 24 24 21* 20*  GLUCOSE 96 98 88 79   BUN 52* 36* 17 18  CREATININE 1.49* 1.29* 1.12* 1.12*  CALCIUM 7.8* 8.2* 8.5* 8.4*  MG 1.9 1.8 1.9  --    Recent Labs    07/04/22 0340  AST 18  ALT 6  ALKPHOS 74  BILITOT 0.7  PROT 6.1*  ALBUMIN 3.3*   Recent Labs    07/05/22 0335 07/06/22 0403 07/23/22 1118 07/24/22 0309  WBC 6.6 6.2 7.3 4.6  NEUTROABS 4.7 4.6 6.1  --   HGB 9.6* 9.3* 9.9* 9.5*  HCT 29.5* 28.7* 30.6* 32.0*  MCV 89.4 89.7 92.4 100.3*  PLT 145* 144* 357 293   Lab Results  Component Value Date   TSH 0.434 07/23/2022   No results found for: "HGBA1C" Lab Results  Component Value Date   CHOL 145 09/22/2009   HDL 52.60 09/22/2009   LDLCALC 73 09/22/2009  TRIG 97.0 09/22/2009   CHOLHDL 3 09/22/2009    Significant Diagnostic Results in last 30 days:  CT Angio Chest PE W and/or Wo Contrast  Result Date: 07/23/2022 CLINICAL DATA:  Shortness of breath. EXAM: CT ANGIOGRAPHY CHEST WITH CONTRAST TECHNIQUE: Multidetector CT imaging of the chest was performed using the standard protocol during bolus administration of intravenous contrast. Multiplanar CT image reconstructions and MIPs were obtained to evaluate the vascular anatomy. RADIATION DOSE REDUCTION: This exam was performed according to the departmental dose-optimization program which includes automated exposure control, adjustment of the mA and/or kV according to patient size and/or use of iterative reconstruction technique. CONTRAST:  100 mL Omnipaque 350 intravenously. COMPARISON:  June 01, 2015. FINDINGS: Cardiovascular: Small filling defect is seen in lower lobe branch of right pulmonary artery consistent with small pulmonary embolus. 5.4 cm ascending thoracic aortic aneurysm is noted. Mild cardiomegaly is noted. No pericardial effusion is noted. Mediastinum/Nodes: Moderate size hiatal hernia is noted. Thyroid gland is unremarkable. No adenopathy is noted. Lungs/Pleura: No pneumothorax is noted. Small left pleural effusion is noted with minimal adjacent  subsegmental atelectasis. Upper Abdomen: No acute abnormality. Musculoskeletal: Old lower thoracic compression fracture is noted. No acute osseous abnormality is noted. Review of the MIP images confirms the above findings. IMPRESSION: Small peripheral pulmonary embolus is noted in lower lobe branch of right pulmonary artery. Critical Value/emergent results were called by telephone at the time of interpretation on 07/23/2022 at 1:44 pm to provider Georgina Snell , who verbally acknowledged these results. 5.4 cm ascending thoracic aortic aneurysm. Recommend semi-annual imaging followup by CTA or MRA and referral to cardiothoracic surgery if not already obtained. This recommendation follows 2010 ACCF/AHA/AATS/ACR/ASA/SCA/SCAI/SIR/STS/SVM Guidelines for the Diagnosis and Management of Patients With Thoracic Aortic Disease. Circulation. 2010; 121: Q008-Q76. Aortic aneurysm NOS (ICD10-I71.9). Small left pleural effusion is noted with minimal adjacent subsegmental atelectasis. Moderate size hiatal hernia is noted. Aortic Atherosclerosis (ICD10-I70.0). Electronically Signed   By: Marijo Conception M.D.   On: 07/23/2022 13:44   DG Chest Portable 1 View  Result Date: 07/23/2022 CLINICAL DATA:  Dyspnea. EXAM: PORTABLE CHEST 1 VIEW COMPARISON:  Chest radiograph 07/03/2022 FINDINGS: Stable enlargement of the cardiac silhouette. Hazy densities at the left lung base. Otherwise, the lungs are clear. Negative for a pneumothorax. No acute bone abnormality. IMPRESSION: No acute chest findings. Mild haziness at the left lung base probably represents overlying structures. Electronically Signed   By: Markus Daft M.D.   On: 07/23/2022 11:11    Assessment/Plan There are no diagnoses linked to this encounter.   Family/ staff Communication: ***  Labs/tests ordered:  ***

## 2022-08-20 DIAGNOSIS — R41841 Cognitive communication deficit: Secondary | ICD-10-CM | POA: Diagnosis not present

## 2022-08-20 DIAGNOSIS — S72001D Fracture of unspecified part of neck of right femur, subsequent encounter for closed fracture with routine healing: Secondary | ICD-10-CM | POA: Diagnosis not present

## 2022-08-20 DIAGNOSIS — R2681 Unsteadiness on feet: Secondary | ICD-10-CM | POA: Diagnosis not present

## 2022-08-20 DIAGNOSIS — M6281 Muscle weakness (generalized): Secondary | ICD-10-CM | POA: Diagnosis not present

## 2022-08-20 DIAGNOSIS — Z9181 History of falling: Secondary | ICD-10-CM | POA: Diagnosis not present

## 2022-08-20 DIAGNOSIS — R278 Other lack of coordination: Secondary | ICD-10-CM | POA: Diagnosis not present

## 2022-08-20 DIAGNOSIS — R2689 Other abnormalities of gait and mobility: Secondary | ICD-10-CM | POA: Diagnosis not present

## 2022-08-20 MED ORDER — ZINC 50 MG PO TABS: 50.0000 mg | ORAL_TABLET | Freq: Every day | ORAL | 0 refills | Status: AC

## 2022-08-20 MED ORDER — VITAMIN C 1000 MG PO TABS: 1000.0000 mg | ORAL_TABLET | Freq: Every day | ORAL | 0 refills | Status: AC

## 2022-08-20 MED ORDER — VITAMIN D3 25 MCG (1000 UT) PO CAPS: 2000.0000 [IU] | ORAL_CAPSULE | Freq: Every day | ORAL | 0 refills | Status: AC

## 2022-08-22 DIAGNOSIS — Z9181 History of falling: Secondary | ICD-10-CM | POA: Diagnosis not present

## 2022-08-22 DIAGNOSIS — M6281 Muscle weakness (generalized): Secondary | ICD-10-CM | POA: Diagnosis not present

## 2022-08-22 DIAGNOSIS — R41841 Cognitive communication deficit: Secondary | ICD-10-CM | POA: Diagnosis not present

## 2022-08-22 DIAGNOSIS — R2681 Unsteadiness on feet: Secondary | ICD-10-CM | POA: Diagnosis not present

## 2022-08-22 DIAGNOSIS — R2689 Other abnormalities of gait and mobility: Secondary | ICD-10-CM | POA: Diagnosis not present

## 2022-08-22 DIAGNOSIS — R278 Other lack of coordination: Secondary | ICD-10-CM | POA: Diagnosis not present

## 2022-08-22 DIAGNOSIS — S72001D Fracture of unspecified part of neck of right femur, subsequent encounter for closed fracture with routine healing: Secondary | ICD-10-CM | POA: Diagnosis not present

## 2022-08-23 DIAGNOSIS — R278 Other lack of coordination: Secondary | ICD-10-CM | POA: Diagnosis not present

## 2022-08-23 DIAGNOSIS — M6281 Muscle weakness (generalized): Secondary | ICD-10-CM | POA: Diagnosis not present

## 2022-08-23 DIAGNOSIS — R2681 Unsteadiness on feet: Secondary | ICD-10-CM | POA: Diagnosis not present

## 2022-08-23 DIAGNOSIS — R2689 Other abnormalities of gait and mobility: Secondary | ICD-10-CM | POA: Diagnosis not present

## 2022-08-23 DIAGNOSIS — R41841 Cognitive communication deficit: Secondary | ICD-10-CM | POA: Diagnosis not present

## 2022-08-26 DIAGNOSIS — R278 Other lack of coordination: Secondary | ICD-10-CM | POA: Diagnosis not present

## 2022-08-26 DIAGNOSIS — R2681 Unsteadiness on feet: Secondary | ICD-10-CM | POA: Diagnosis not present

## 2022-08-26 DIAGNOSIS — Z9181 History of falling: Secondary | ICD-10-CM | POA: Diagnosis not present

## 2022-08-26 DIAGNOSIS — R2689 Other abnormalities of gait and mobility: Secondary | ICD-10-CM | POA: Diagnosis not present

## 2022-08-26 DIAGNOSIS — R41841 Cognitive communication deficit: Secondary | ICD-10-CM | POA: Diagnosis not present

## 2022-08-26 DIAGNOSIS — M6281 Muscle weakness (generalized): Secondary | ICD-10-CM | POA: Diagnosis not present

## 2022-08-26 DIAGNOSIS — S72001D Fracture of unspecified part of neck of right femur, subsequent encounter for closed fracture with routine healing: Secondary | ICD-10-CM | POA: Diagnosis not present

## 2022-08-28 DIAGNOSIS — S72001D Fracture of unspecified part of neck of right femur, subsequent encounter for closed fracture with routine healing: Secondary | ICD-10-CM | POA: Diagnosis not present

## 2022-08-28 DIAGNOSIS — M6281 Muscle weakness (generalized): Secondary | ICD-10-CM | POA: Diagnosis not present

## 2022-08-28 DIAGNOSIS — Z9181 History of falling: Secondary | ICD-10-CM | POA: Diagnosis not present

## 2022-08-29 DIAGNOSIS — R2689 Other abnormalities of gait and mobility: Secondary | ICD-10-CM | POA: Diagnosis not present

## 2022-08-29 DIAGNOSIS — R41841 Cognitive communication deficit: Secondary | ICD-10-CM | POA: Diagnosis not present

## 2022-08-29 DIAGNOSIS — S72001D Fracture of unspecified part of neck of right femur, subsequent encounter for closed fracture with routine healing: Secondary | ICD-10-CM | POA: Diagnosis not present

## 2022-08-29 DIAGNOSIS — R278 Other lack of coordination: Secondary | ICD-10-CM | POA: Diagnosis not present

## 2022-08-29 DIAGNOSIS — Z9181 History of falling: Secondary | ICD-10-CM | POA: Diagnosis not present

## 2022-08-29 DIAGNOSIS — R2681 Unsteadiness on feet: Secondary | ICD-10-CM | POA: Diagnosis not present

## 2022-08-29 DIAGNOSIS — M6281 Muscle weakness (generalized): Secondary | ICD-10-CM | POA: Diagnosis not present

## 2022-08-30 DIAGNOSIS — R41841 Cognitive communication deficit: Secondary | ICD-10-CM | POA: Diagnosis not present

## 2022-08-30 NOTE — Progress Notes (Deleted)
Virtual Visit Via Video       Consent was obtained for video visit:  {yes no:314532} Answered questions that patient had about telehealth interaction:  {yes no:314532} I discussed the limitations, risks, security and privacy concerns of performing an evaluation and management service by telemedicine. I also discussed with the patient that there may be a patient responsible charge related to this service. The patient expressed understanding and agreed to proceed.  Pt location: Home Physician Location: office Name of referring provider:  [name], MD I connected with Nell Range at patients initiation/request on 09/02/2022 at 11:15 AM EDT by video enabled telemedicine application and verified that I am speaking with the correct person using two identifiers. Pt MRN:  [id number] Pt DOB:  [birth year] Video Participants:  Nell Range;  ***  Assessment/Plan:   1.  Parkinsons Disease  -Continue carbidopa/levodopa 25/100, 1 tablet 3 times per day.    -She has had some recent increased falls, but this is post hip fracture and during a time where she was positive for COVID.  I do not think that this is a primary Parkinson's issue and I am not going to change medication.  2.  DVT and subsequent PE, post hip fracture  -On Eliquis  Subjective:   Ethelyn B Lockyer was seen today in follow up for Parkinsons disease.  My previous records were reviewed prior to todays visit as well as outside records available to me.  Pt with daughter who supplements the history.   Patient was in the emergency room at the end of July after a fall and subsequently sustained a right hip fracture.  She underwent right hip hemiarthroplasty July 27 with Dr. Ninfa Linden.  [facility].  Her stay was complicated by a DVT and she was placed on Eliquis.  Not long thereafter (3 days after the DVT diagnosis), she was readmitted to the hospital with an acute pulmonary embolism.  She has  been back to the nursing facility.  Nursing facility notes indicate that she fell again on September 9, but did decline x-ray done.  She has a follow-up with Dr. Ninfa Linden September 26.  She also tested positive for COVID September 11.  Current prescribed movement disorder medications: Carbidopa/levodopa 25/100, 1 tablet 3 times per day    ALLERGIES:   Allergies  Allergen Reactions   Norvasc [Amlodipine] Anaphylaxis, Swelling and Other (See Comments)    Edema   Altace [Ramipril] Cough   Fosamax [Alendronate] Other (See Comments)    Dysphagia    CURRENT MEDICATIONS:  Outpatient Encounter Medications as of 09/02/2022  Medication Sig   acetaminophen (TYLENOL) 325 MG tablet Take 650 mg by mouth 3 (three) times daily.   apixaban (ELIQUIS) 5 MG TABS tablet Take 10 mg by mouth 2 (two) times daily.   apixaban (ELIQUIS) 5 MG TABS tablet Take 5 mg by mouth 2 (two) times daily.   atorvastatin (LIPITOR) 10 MG tablet Take 10 mg by mouth at bedtime.   carbidopa-levodopa (SINEMET IR) 25-100 MG tablet Take 1 tablet by mouth 3 (three) times daily.   docusate sodium (COLACE) 100 MG capsule Take 1 capsule (100 mg total) by mouth 2 (two) times daily.   ferrous sulfate 325 (65 FE) MG tablet Take 325 mg by mouth 3 (three) times a week. Monday, Wednesday, and Friday.   furosemide (LASIX) 20 MG tablet Take 20 mg by mouth daily as needed for fluid.   melatonin 1 MG TABS tablet  Take 2 mg by mouth daily.   methocarbamol (ROBAXIN) 500 MG tablet Take 250 mg by mouth at bedtime as needed for muscle spasms. Avoid Norco use only for moderate pain.   metoprolol tartrate 37.5 MG TABS Take 37.5 mg by mouth 2 (two) times daily.   nitroGLYCERIN (NITROSTAT) 0.4 MG SL tablet Place 1 tablet (0.4 mg total) under the tongue every 5 (five) minutes as needed for chest pain.   pantoprazole (PROTONIX) 20 MG tablet Take 20 mg by mouth daily.   polyethylene glycol (MIRALAX / GLYCOLAX) 17 g packet Take 17 g by mouth daily.   zinc  oxide 20 % ointment Apply 1 Application topically as needed for irritation.   No facility-administered encounter medications on file as of 09/02/2022.    Objective:   PHYSICAL EXAMINATION:    VITALS:   There were no vitals filed for this visit.  Wt Readings from Last 3 Encounters:  08/19/22 118 lb (53.5 kg)  08/15/22 118 lb (53.5 kg)  08/09/22 118 lb (53.5 kg)      GEN:  The patient appears stated age and is in NAD. HEENT:  Normocephalic, atraumatic.  The mucous membranes are moist. The superficial temporal arteries are without ropiness or tenderness. CV:  RRR Lungs:  CTAB Neck/HEME:  There are no carotid bruits bilaterally.  Neurological examination:  Orientation: The patient is alert and oriented x3. Cranial nerves: There is good facial symmetry with min facial hypomimia. The speech is fluent and clear. Soft palate rises symmetrically and there is no tongue deviation. Hearing is intact to conversational tone. Sensation: Sensation is intact to light touch throughout Motor: Strength is at least antigravity x4.  Movement examination: Tone: There is normal tone today Abnormal movements: none Coordination:  There is no decremation today Gait and Station: Patient pushes off of the chair to arise.  The patient's stride length is slightly decreased but she is wide based.  She has a valgus knee deformity    Cc:  Virgie Dad, MD

## 2022-09-02 ENCOUNTER — Telehealth: Payer: PPO | Admitting: Neurology

## 2022-09-02 DIAGNOSIS — Z9181 History of falling: Secondary | ICD-10-CM | POA: Diagnosis not present

## 2022-09-02 DIAGNOSIS — R41841 Cognitive communication deficit: Secondary | ICD-10-CM | POA: Diagnosis not present

## 2022-09-02 DIAGNOSIS — M6281 Muscle weakness (generalized): Secondary | ICD-10-CM | POA: Diagnosis not present

## 2022-09-02 DIAGNOSIS — S72001D Fracture of unspecified part of neck of right femur, subsequent encounter for closed fracture with routine healing: Secondary | ICD-10-CM | POA: Diagnosis not present

## 2022-09-02 DIAGNOSIS — R278 Other lack of coordination: Secondary | ICD-10-CM | POA: Diagnosis not present

## 2022-09-02 DIAGNOSIS — R2681 Unsteadiness on feet: Secondary | ICD-10-CM | POA: Diagnosis not present

## 2022-09-02 DIAGNOSIS — R2689 Other abnormalities of gait and mobility: Secondary | ICD-10-CM | POA: Diagnosis not present

## 2022-09-03 ENCOUNTER — Encounter: Payer: PPO | Admitting: Orthopaedic Surgery

## 2022-09-03 DIAGNOSIS — R278 Other lack of coordination: Secondary | ICD-10-CM | POA: Diagnosis not present

## 2022-09-03 DIAGNOSIS — M6281 Muscle weakness (generalized): Secondary | ICD-10-CM | POA: Diagnosis not present

## 2022-09-03 DIAGNOSIS — R41841 Cognitive communication deficit: Secondary | ICD-10-CM | POA: Diagnosis not present

## 2022-09-03 DIAGNOSIS — R2681 Unsteadiness on feet: Secondary | ICD-10-CM | POA: Diagnosis not present

## 2022-09-03 DIAGNOSIS — R2689 Other abnormalities of gait and mobility: Secondary | ICD-10-CM | POA: Diagnosis not present

## 2022-09-04 DIAGNOSIS — S72001D Fracture of unspecified part of neck of right femur, subsequent encounter for closed fracture with routine healing: Secondary | ICD-10-CM | POA: Diagnosis not present

## 2022-09-04 DIAGNOSIS — Z9181 History of falling: Secondary | ICD-10-CM | POA: Diagnosis not present

## 2022-09-04 DIAGNOSIS — M6281 Muscle weakness (generalized): Secondary | ICD-10-CM | POA: Diagnosis not present

## 2022-09-05 DIAGNOSIS — M6281 Muscle weakness (generalized): Secondary | ICD-10-CM | POA: Diagnosis not present

## 2022-09-05 DIAGNOSIS — R2689 Other abnormalities of gait and mobility: Secondary | ICD-10-CM | POA: Diagnosis not present

## 2022-09-05 DIAGNOSIS — Z9181 History of falling: Secondary | ICD-10-CM | POA: Diagnosis not present

## 2022-09-05 DIAGNOSIS — S72001D Fracture of unspecified part of neck of right femur, subsequent encounter for closed fracture with routine healing: Secondary | ICD-10-CM | POA: Diagnosis not present

## 2022-09-05 DIAGNOSIS — R2681 Unsteadiness on feet: Secondary | ICD-10-CM | POA: Diagnosis not present

## 2022-09-05 DIAGNOSIS — R278 Other lack of coordination: Secondary | ICD-10-CM | POA: Diagnosis not present

## 2022-09-05 DIAGNOSIS — R41841 Cognitive communication deficit: Secondary | ICD-10-CM | POA: Diagnosis not present

## 2022-09-06 ENCOUNTER — Non-Acute Institutional Stay (INDEPENDENT_AMBULATORY_CARE_PROVIDER_SITE_OTHER): Payer: PPO | Admitting: Orthopedic Surgery

## 2022-09-06 ENCOUNTER — Encounter: Payer: Self-pay | Admitting: Orthopedic Surgery

## 2022-09-06 DIAGNOSIS — Z0000 Encounter for general adult medical examination without abnormal findings: Secondary | ICD-10-CM

## 2022-09-06 DIAGNOSIS — R41841 Cognitive communication deficit: Secondary | ICD-10-CM | POA: Diagnosis not present

## 2022-09-06 NOTE — Progress Notes (Signed)
Subjective:   Taylor Mendoza is a 88 y.o. female who presents for Medicare Annual (Subsequent) preventive examination.  Place of Service: [facility] Provider: Windell Moulding, AGNP-C   Review of Systems     Cardiac Risk Factors include: advanced age (>87mn, >>16women);sedentary lifestyle;hypertension     Objective:    Today's Vitals   09/06/22 1021  BP: 130/87  Pulse: (!) 55  Resp: 20  Temp: 97.8 F (36.6 C)  SpO2: 99%  Weight: 118 lb (53.5 kg)  Height: '5\' 5"'$  (1.651 m)   Body mass index is 19.64 kg/m.     08/15/2022   11:01 AM 08/09/2022   12:31 PM 08/08/2022    3:43 PM 07/26/2022    8:57 AM 07/09/2022    3:06 PM 07/04/2022    9:55 AM 07/04/2022   12:00 AM  Advanced Directives  Does Patient Have a Medical Advance Directive? Yes Yes No No No No No  Type of Advance Directive Out of facility DNR (pink MOST or yellow form) Out of facility DNR (pink MOST or yellow form)       Does patient want to make changes to medical advance directive? No - Patient declined No - Patient declined       Would patient like information on creating a medical advance directive?   No - Guardian declined No - Guardian declined No - Guardian declined No - Guardian declined No - Guardian declined    Current Medications (verified) Outpatient Encounter Medications as of 09/06/2022  Medication Sig   acetaminophen (TYLENOL) 325 MG tablet Take 650 mg by mouth 3 (three) times daily.   apixaban (ELIQUIS) 5 MG TABS tablet Take 10 mg by mouth 2 (two) times daily.   apixaban (ELIQUIS) 5 MG TABS tablet Take 5 mg by mouth 2 (two) times daily.   atorvastatin (LIPITOR) 10 MG tablet Take 10 mg by mouth at bedtime.   carbidopa-levodopa (SINEMET IR) 25-100 MG tablet Take 1 tablet by mouth 3 (three) times daily.   docusate sodium (COLACE) 100 MG capsule Take 1 capsule (100 mg total) by mouth 2 (two) times daily.   ferrous sulfate 325 (65 FE) MG tablet Take 325 mg by mouth 3 (three) times a week.  Monday, Wednesday, and Friday.   furosemide (LASIX) 20 MG tablet Take 20 mg by mouth daily as needed for fluid.   melatonin 1 MG TABS tablet Take 2 mg by mouth daily.   methocarbamol (ROBAXIN) 500 MG tablet Take 250 mg by mouth at bedtime as needed for muscle spasms. Avoid Norco use only for moderate pain.   metoprolol tartrate 37.5 MG TABS Take 37.5 mg by mouth 2 (two) times daily.   nitroGLYCERIN (NITROSTAT) 0.4 MG SL tablet Place 1 tablet (0.4 mg total) under the tongue every 5 (five) minutes as needed for chest pain.   pantoprazole (PROTONIX) 20 MG tablet Take 20 mg by mouth daily.   polyethylene glycol (MIRALAX / GLYCOLAX) 17 g packet Take 17 g by mouth daily.   zinc oxide 20 % ointment Apply 1 Application topically as needed for irritation.   No facility-administered encounter medications on file as of 09/06/2022.    Allergies (verified) Norvasc [amlodipine], Altace [ramipril], and Fosamax [alendronate]   History: Past Medical History:  Diagnosis Date   Coronary artery disease    Methicillin resistant Staphylococcus aureus in conditions classified elsewhere and of unspecified site    MI (myocardial infarction) (HFerdinand    Other and unspecified hyperlipidemia  Unspecified essential hypertension    Past Surgical History:  Procedure Laterality Date   CATARACT EXTRACTION, BILATERAL     CORONARY ANGIOPLASTY WITH STENT PLACEMENT     HIP ARTHROPLASTY Right 07/04/2022   Procedure: RIGHT ANTERIOR HIP REPLACEMENT ;  Surgeon: Mcarthur Rossetti, MD;  Location: [facility];  Service: Orthopedics;  Laterality: Right;   Family History  Problem Relation Age of Onset   Osteoporosis Mother    Skin cancer Mother    Stroke Father    Stroke Brother    Social History   Socioeconomic History   Marital status: Married    Spouse name: Not on file   Number of children: Not on file   Years of education: Not on file   Highest education level: Not on file  Occupational History   Not on file   Tobacco Use   Smoking status: Never   Smokeless tobacco: Never  Vaping Use   Vaping Use: Never used  Substance and Sexual Activity   Alcohol use: No   Drug use: No   Sexual activity: Not on file  Other Topics Concern   Not on file  Social History Narrative   Right handed    [facility]   Social Determinants of Health   Financial Resource Strain: Low Risk  (09/06/2022)   Overall Financial Resource Strain (CARDIA)    Difficulty of Paying Living Expenses: Not hard at all  Food Insecurity: No Food Insecurity (09/06/2022)   Hunger Vital Sign    Worried About Running Out of Food in the Last Year: Never true    [place] in the Last Year: Never true  Transportation Needs: No Transportation Needs (09/06/2022)   PRAPARE - Hydrologist (Medical): No    Lack of Transportation (Non-Medical): No  Physical Activity: Insufficiently Active (09/06/2022)   Exercise Vital Sign    Days of Exercise per Week: 7 days    Minutes of Exercise per Session: 10 min  Stress: No Stress Concern Present (09/06/2022)   [place]    Feeling of Stress : Not at all  Social Connections: Moderately Isolated (09/06/2022)   Social Connection and Isolation Panel [NHANES]    Frequency of Communication with Friends and Family: Three times a week    Frequency of Social Gatherings with Friends and Family: Twice a week    Attends Religious Services: Never    Marine scientist or Organizations: No    Attends Music therapist: Never    Marital Status: Married    Tobacco Counseling Counseling given: Not Answered   Clinical Intake:  Pre-visit preparation completed: No  Pain : No/denies pain     BMI - recorded: 19.64 Nutritional Status: BMI of 19-24  Normal Nutritional Risks: None Diabetes: No  How often do you need to have someone help you when you read instructions, pamphlets, or other  written materials from your doctor or pharmacy?: 2 - Rarely What is the last grade level you completed in school?: College  Diabetic?No  Interpreter Needed?: No      Activities of Daily Living    09/06/2022   10:30 AM 07/04/2022   12:00 AM  In your present state of health, do you have any difficulty performing the following activities:  Hearing? 0 0  Vision? 0 0  Difficulty concentrating or making decisions? 0 0  Walking or climbing stairs? 1 1  Dressing or bathing? 0  0  Doing errands, shopping? 1 1  Preparing Food and eating ? N   Using the Toilet? N   In the past six months, have you accidently leaked urine? N   Do you have problems with loss of bowel control? N   Managing your Medications? Y   Managing your Finances? Y   Housekeeping or managing your Housekeeping? Y     Patient Care Team: Virgie Dad, MD as PCP - General (Internal Medicine) Sherren Mocha, MD as PCP - Cardiology (Cardiology) [name], MD (Internal Medicine) Tat, Eustace Quail, DO as Consulting Physician (Neurology)  Indicate any recent Medical Services you may have received from other than [facility] providers in the past year (date may be approximate).     Assessment:   This is a routine wellness examination for Talulah.  Hearing/Vision screen No results found.  Dietary issues and exercise activities discussed: Current Exercise Habits: The patient does not participate in regular exercise at present, Exercise limited by: neurologic condition(s);orthopedic condition(s);cardiac condition(s)   Goals Addressed             This Visit's Progress    Maintain Mobility and Function   On track    Evidence-based guidance:  Acknowledge and validate impact of pain, loss of strength and potential disfigurement (hand osteoarthritis) on mental health and daily life, such as social isolation, anxiety, depression, impaired sexual relationship and   injury from falls.  Anticipate referral to physical or  occupational therapy for assessment, therapeutic exercise and recommendation for adaptive equipment or assistive devices; encourage participation.  Assess impact on ability to perform activities of daily living, as well as engage in sports and leisure events or requirements of work or school.  Provide anticipatory guidance and reassurance about the benefit of exercise to maintain function; acknowledge and normalize fear that exercise may worsen symptoms.  Encourage regular exercise, at least 10 minutes at a time for 45 minutes per week; consider yoga, water exercise and proprioceptive exercises; encourage use of wearable activity tracker to increase motivation and adherence.  Encourage maintenance or resumption of daily activities, including employment, as pain allows and with minimal exposure to trauma.  Assist patient to advocate for adaptations to the work environment.  Consider level of pain and function, gender, age, lifestyle, patient preference, quality of life, readiness and ?ocapacity to benefit? when recommending patients for orthopaedic surgery consultation.  Explore strategies, such as changes to medication regimen or activity that enables patient to anticipate and manage flare-ups that increase deconditioning and disability.  Explore patient preferences; encourage exposure to a broader range of activities that have been avoided for fear of experiencing pain.  Identify barriers to participation in therapy or exercise, such as pain with activity, anticipated or imagined pain.  Monitor postoperative joint replacement or any preexisting joint replacement for ongoing pain and loss of function; provide social support and encouragement throughout recovery.   Notes:        Depression Screen    09/06/2022   10:29 AM  PHQ 2/9 Scores  PHQ - 2 Score 0    Fall Risk    09/06/2022   10:30 AM 02/27/2022    9:16 AM 04/24/2021    1:04 PM 09/12/2020    2:45 PM 03/16/2020    2:53 PM  [place] in the past year? 1 0 1 1 0  Number falls in past yr: 1 0 1 0   Injury with Fall? 1 0 1 0   Risk  for fall due to : History of fall(s);Impaired balance/gait;Impaired mobility      Follow up Falls evaluation completed;Education provided;Falls prevention discussed        FALL RISK PREVENTION PERTAINING TO THE HOME:  Any stairs in or around the home? No  If so, are there any without handrails? No  Home free of loose throw rugs in walkways, pet beds, electrical cords, etc? Yes  Adequate lighting in your home to reduce risk of falls? Yes   ASSISTIVE DEVICES UTILIZED TO PREVENT FALLS:  Life alert? No  Use of a cane, walker or w/c? Yes  Grab bars in the bathroom? Yes  Shower chair or bench in shower? Yes  Elevated toilet seat or a handicapped toilet? Yes   TIMED UP AND GO:  Was the test performed? No .  Length of time to ambulate 10 feet: N/A sec.   Gait slow and steady with assistive device  Cognitive Function:    09/06/2022   10:31 AM  MMSE - Mini Mental State Exam  Not completed: Refused      07/22/2017    2:26 PM  Montreal Cognitive Assessment   Visuospatial/ Executive (0/5) 4  Naming (0/3) 3  Attention: Read list of digits (0/2) 1  Attention: Read list of letters (0/1) 1  Attention: Serial 7 subtraction starting at 100 (0/3) 2  Language: Repeat phrase (0/2) 2  Language : Fluency (0/1) 0  Abstraction (0/2) 2  Delayed Recall (0/5) 3  Orientation (0/6) 6  Total 24  Adjusted Score (based on education) 25      09/06/2022   10:31 AM  6CIT Screen  What Year? 0 points  What month? 0 points  What time? 0 points  Count back from 20 0 points  Months in reverse 0 points  Repeat phrase 0 points  Total Score 0 points    Immunizations Immunization History  Administered Date(s) Administered   Influenza Split 09/24/2010, 09/04/2011, 09/18/2012, 09/08/2013, 09/09/2015, 10/21/2016, 09/22/2019, 09/13/2021   Influenza-Unspecified 11/09/2014, 09/30/2015, 09/17/2016,  09/01/2017, 09/10/2018   Moderna Sars-Covid-2 Vaccination 12/13/2019, 01/09/2021, 05/16/2021   PFIZER(Purple Top)SARS-COV-2 Vaccination 01/10/2020   Pneumococcal Conjugate-13 04/18/2014   Pneumococcal Polysaccharide-23 09/20/2002, 09/23/2019   Td 10/11/2004   Tdap 06/14/2016   Zoster Recombinat (Shingrix) 02/10/2019, 05/04/2019   Zoster, Live 01/23/2009    TDAP status: Up to date  Flu Vaccine status: Due, Education has been provided regarding the importance of this vaccine. Advised may receive this vaccine at local pharmacy or Health Dept. Aware to provide a copy of the vaccination record if obtained from local pharmacy or Health Dept. Verbalized acceptance and understanding.  Pneumococcal vaccine status: Up to date  Covid-19 vaccine status: Completed vaccines  Qualifies for Shingles Vaccine? Yes   Zostavax completed Yes   Shingrix Completed?: Yes  Screening Tests Health Maintenance  Topic Date Due   COVID-19 Vaccine (5 - Mixed Product series) 07/11/2021   INFLUENZA VACCINE  07/09/2022   TETANUS/TDAP  06/14/2026   Pneumonia Vaccine 53+ Years old  Completed   DEXA SCAN  Completed   Zoster Vaccines- Shingrix  Completed   HPV VACCINES  Aged Out    Health Maintenance  Health Maintenance Due  Topic Date Due   COVID-19 Vaccine (5 - Mixed Product series) 07/11/2021   INFLUENZA VACCINE  07/09/2022    Colorectal cancer screening: No longer required.   Mammogram status: No longer required due to advanced age.  Bone Density status: Completed 2002. Results reflect: Bone density results: OSTEOPENIA. Repeat every  2 years/ refused due to advanced age years.  Lung Cancer Screening: (Low Dose CT Chest recommended if Age 60-80 years, 30 pack-year currently smoking OR have quit w/in 15years.) does not qualify.   Lung Cancer Screening Referral: No  Additional Screening:  Hepatitis C Screening: does not qualify; Completed   Vision Screening: Recommended annual ophthalmology exams  for early detection of glaucoma and other disorders of the eye. Is the patient up to date with their annual eye exam?  No  Who is the provider or what is the name of the office in which the patient attends annual eye exams? N/A If pt is not established with a provider, would they like to be referred to a provider to establish care? No .   Dental Screening: Recommended annual dental exams for proper oral hygiene  Community Resource Referral / Chronic Care Management: CRR required this visit?  No   CCM required this visit?  No      Plan:     I have personally reviewed and noted the following in the patient's chart:   Medical and social history Use of alcohol, tobacco or illicit drugs  Current medications and supplements including opioid prescriptions. Patient is not currently taking opioid prescriptions. Functional ability and status Nutritional status Physical activity Advanced directives List of other physicians Hospitalizations, surgeries, and ER visits in previous 12 months Vitals Screenings to include cognitive, depression, and falls Referrals and appointments  In addition, I have reviewed and discussed with patient certain preventive protocols, quality metrics, and best practice recommendations. A written personalized care plan for preventive services as well as general preventive health recommendations were provided to patient.     Yvonna Alanis, NP   09/06/2022   Nurse Notes: Flu vaccine to be given 09/2022 per [facility]

## 2022-09-09 DIAGNOSIS — R2689 Other abnormalities of gait and mobility: Secondary | ICD-10-CM | POA: Diagnosis not present

## 2022-09-09 DIAGNOSIS — R278 Other lack of coordination: Secondary | ICD-10-CM | POA: Diagnosis not present

## 2022-09-09 DIAGNOSIS — S72001D Fracture of unspecified part of neck of right femur, subsequent encounter for closed fracture with routine healing: Secondary | ICD-10-CM | POA: Diagnosis not present

## 2022-09-09 DIAGNOSIS — R2681 Unsteadiness on feet: Secondary | ICD-10-CM | POA: Diagnosis not present

## 2022-09-09 DIAGNOSIS — R41841 Cognitive communication deficit: Secondary | ICD-10-CM | POA: Diagnosis not present

## 2022-09-09 DIAGNOSIS — M6281 Muscle weakness (generalized): Secondary | ICD-10-CM | POA: Diagnosis not present

## 2022-09-09 DIAGNOSIS — Z9181 History of falling: Secondary | ICD-10-CM | POA: Diagnosis not present

## 2022-09-10 DIAGNOSIS — R2681 Unsteadiness on feet: Secondary | ICD-10-CM | POA: Diagnosis not present

## 2022-09-10 DIAGNOSIS — R41841 Cognitive communication deficit: Secondary | ICD-10-CM | POA: Diagnosis not present

## 2022-09-10 DIAGNOSIS — R2689 Other abnormalities of gait and mobility: Secondary | ICD-10-CM | POA: Diagnosis not present

## 2022-09-10 DIAGNOSIS — R278 Other lack of coordination: Secondary | ICD-10-CM | POA: Diagnosis not present

## 2022-09-10 DIAGNOSIS — M6281 Muscle weakness (generalized): Secondary | ICD-10-CM | POA: Diagnosis not present

## 2022-09-10 NOTE — Progress Notes (Deleted)
Virtual Visit Via Video       Consent was obtained for video visit:  {yes no:314532} Answered questions that patient had about telehealth interaction:  {yes no:314532} I discussed the limitations, risks, security and privacy concerns of performing an evaluation and management service by telemedicine. I also discussed with the patient that there may be a patient responsible charge related to this service. The patient expressed understanding and agreed to proceed.  Pt location: Home Physician Location: office Name of referring provider:  Virgie Dad, MD I connected with Taylor Mendoza at patients initiation/request on 09/13/2022 at  8:45 AM EDT by video enabled telemedicine application and verified that I am speaking with the correct person using two identifiers. Pt MRN:  [id number] Pt DOB:  09/13/1930 Video Participants:  Taylor Mendoza;  ***  Assessment/Plan:   1.  Parkinsons Disease  -Continue carbidopa/levodopa 25/100, 1 tablet 3 times per day.    -She has had some recent increased falls, but this is post hip fracture and during a time where she was positive for COVID.  I do not think that this is a primary Parkinson's issue and I am not going to change medication.  2.  DVT and subsequent PE, post hip fracture  -On Eliquis  Subjective:   Taylor Mendoza was seen today in follow up for Parkinsons disease.  My previous records were reviewed prior to todays visit as well as outside records available to me.  Pt with daughter who supplements the history.   Patient was in the emergency room at the end of July after a fall and subsequently sustained a right hip fracture.  She underwent right hip hemiarthroplasty July 27 with Dr. Ninfa Linden.  [facility].  Her stay was complicated by a DVT and she was placed on Eliquis.  Not long thereafter (3 days after the DVT diagnosis), she was readmitted to the hospital with an acute pulmonary embolism.  She  has been back to the nursing facility.  Nursing facility notes indicate that she fell again on September 9, but did decline x-ray done.  She has a follow-up with Dr. Ninfa Linden September 26.  She also tested positive for COVID September 11.  Current prescribed movement disorder medications: Carbidopa/levodopa 25/100, 1 tablet 3 times per day    ALLERGIES:   Allergies  Allergen Reactions   Norvasc [Amlodipine] Anaphylaxis, Swelling and Other (See Comments)    Edema   Altace [Ramipril] Cough   Fosamax [Alendronate] Other (See Comments)    Dysphagia    CURRENT MEDICATIONS:  Outpatient Encounter Medications as of 09/13/2022  Medication Sig   acetaminophen (TYLENOL) 325 MG tablet Take 650 mg by mouth 3 (three) times daily.   apixaban (ELIQUIS) 5 MG TABS tablet Take 10 mg by mouth 2 (two) times daily.   apixaban (ELIQUIS) 5 MG TABS tablet Take 5 mg by mouth 2 (two) times daily.   atorvastatin (LIPITOR) 10 MG tablet Take 10 mg by mouth at bedtime.   carbidopa-levodopa (SINEMET IR) 25-100 MG tablet Take 1 tablet by mouth 3 (three) times daily.   docusate sodium (COLACE) 100 MG capsule Take 1 capsule (100 mg total) by mouth 2 (two) times daily.   ferrous sulfate 325 (65 FE) MG tablet Take 325 mg by mouth 3 (three) times a week. Monday, Wednesday, and Friday.   furosemide (LASIX) 20 MG tablet Take 20 mg by mouth daily as needed for fluid.   melatonin 1 MG  TABS tablet Take 2 mg by mouth daily.   methocarbamol (ROBAXIN) 500 MG tablet Take 250 mg by mouth at bedtime as needed for muscle spasms. Avoid Norco use only for moderate pain.   metoprolol tartrate 37.5 MG TABS Take 37.5 mg by mouth 2 (two) times daily.   nitroGLYCERIN (NITROSTAT) 0.4 MG SL tablet Place 1 tablet (0.4 mg total) under the tongue every 5 (five) minutes as needed for chest pain.   pantoprazole (PROTONIX) 20 MG tablet Take 20 mg by mouth daily.   polyethylene glycol (MIRALAX / GLYCOLAX) 17 g packet Take 17 g by mouth daily.    zinc oxide 20 % ointment Apply 1 Application topically as needed for irritation.   No facility-administered encounter medications on file as of 09/13/2022.    Objective:   PHYSICAL EXAMINATION:    VITALS:   There were no vitals filed for this visit.  Wt Readings from Last 3 Encounters:  09/06/22 118 lb (53.5 kg)  08/19/22 118 lb (53.5 kg)  08/15/22 118 lb (53.5 kg)      GEN:  The patient appears stated age and is in NAD. HEENT:  Normocephalic, atraumatic.  The mucous membranes are moist. The superficial temporal arteries are without ropiness or tenderness. CV:  RRR Lungs:  CTAB Neck/HEME:  There are no carotid bruits bilaterally.  Neurological examination:  Orientation: The patient is alert and oriented x3. Cranial nerves: There is good facial symmetry with min facial hypomimia. The speech is fluent and clear. Soft palate rises symmetrically and there is no tongue deviation. Hearing is intact to conversational tone. Sensation: Sensation is intact to light touch throughout Motor: Strength is at least antigravity x4.  Movement examination: Tone: There is normal tone today Abnormal movements: none Coordination:  There is no decremation today Gait and Station: Patient pushes off of the chair to arise.  The patient's stride length is slightly decreased but she is wide based.  She has a valgus knee deformity    Cc:  Virgie Dad, MD

## 2022-09-11 DIAGNOSIS — Z9181 History of falling: Secondary | ICD-10-CM | POA: Diagnosis not present

## 2022-09-11 DIAGNOSIS — S72001D Fracture of unspecified part of neck of right femur, subsequent encounter for closed fracture with routine healing: Secondary | ICD-10-CM | POA: Diagnosis not present

## 2022-09-11 DIAGNOSIS — M6281 Muscle weakness (generalized): Secondary | ICD-10-CM | POA: Diagnosis not present

## 2022-09-12 DIAGNOSIS — Z9181 History of falling: Secondary | ICD-10-CM | POA: Diagnosis not present

## 2022-09-12 DIAGNOSIS — M6281 Muscle weakness (generalized): Secondary | ICD-10-CM | POA: Diagnosis not present

## 2022-09-12 DIAGNOSIS — R41841 Cognitive communication deficit: Secondary | ICD-10-CM | POA: Diagnosis not present

## 2022-09-12 DIAGNOSIS — R2681 Unsteadiness on feet: Secondary | ICD-10-CM | POA: Diagnosis not present

## 2022-09-12 DIAGNOSIS — R278 Other lack of coordination: Secondary | ICD-10-CM | POA: Diagnosis not present

## 2022-09-12 DIAGNOSIS — R2689 Other abnormalities of gait and mobility: Secondary | ICD-10-CM | POA: Diagnosis not present

## 2022-09-12 DIAGNOSIS — S72001D Fracture of unspecified part of neck of right femur, subsequent encounter for closed fracture with routine healing: Secondary | ICD-10-CM | POA: Diagnosis not present

## 2022-09-13 ENCOUNTER — Telehealth: Payer: PPO | Admitting: Neurology

## 2022-09-13 DIAGNOSIS — R41841 Cognitive communication deficit: Secondary | ICD-10-CM | POA: Diagnosis not present

## 2022-09-16 DIAGNOSIS — R41841 Cognitive communication deficit: Secondary | ICD-10-CM | POA: Diagnosis not present

## 2022-09-16 DIAGNOSIS — S72001D Fracture of unspecified part of neck of right femur, subsequent encounter for closed fracture with routine healing: Secondary | ICD-10-CM | POA: Diagnosis not present

## 2022-09-16 DIAGNOSIS — R278 Other lack of coordination: Secondary | ICD-10-CM | POA: Diagnosis not present

## 2022-09-16 DIAGNOSIS — R2681 Unsteadiness on feet: Secondary | ICD-10-CM | POA: Diagnosis not present

## 2022-09-16 DIAGNOSIS — M6281 Muscle weakness (generalized): Secondary | ICD-10-CM | POA: Diagnosis not present

## 2022-09-16 DIAGNOSIS — R2689 Other abnormalities of gait and mobility: Secondary | ICD-10-CM | POA: Diagnosis not present

## 2022-09-16 DIAGNOSIS — Z9181 History of falling: Secondary | ICD-10-CM | POA: Diagnosis not present

## 2022-09-17 DIAGNOSIS — R2681 Unsteadiness on feet: Secondary | ICD-10-CM | POA: Diagnosis not present

## 2022-09-17 DIAGNOSIS — R278 Other lack of coordination: Secondary | ICD-10-CM | POA: Diagnosis not present

## 2022-09-17 DIAGNOSIS — R41841 Cognitive communication deficit: Secondary | ICD-10-CM | POA: Diagnosis not present

## 2022-09-17 DIAGNOSIS — R2689 Other abnormalities of gait and mobility: Secondary | ICD-10-CM | POA: Diagnosis not present

## 2022-09-17 DIAGNOSIS — M6281 Muscle weakness (generalized): Secondary | ICD-10-CM | POA: Diagnosis not present

## 2022-09-18 ENCOUNTER — Encounter: Payer: PPO | Admitting: Orthopaedic Surgery

## 2022-09-18 DIAGNOSIS — Z9181 History of falling: Secondary | ICD-10-CM | POA: Diagnosis not present

## 2022-09-18 DIAGNOSIS — S72001D Fracture of unspecified part of neck of right femur, subsequent encounter for closed fracture with routine healing: Secondary | ICD-10-CM | POA: Diagnosis not present

## 2022-09-18 DIAGNOSIS — M6281 Muscle weakness (generalized): Secondary | ICD-10-CM | POA: Diagnosis not present

## 2022-09-19 DIAGNOSIS — S72001D Fracture of unspecified part of neck of right femur, subsequent encounter for closed fracture with routine healing: Secondary | ICD-10-CM | POA: Diagnosis not present

## 2022-09-19 DIAGNOSIS — M6281 Muscle weakness (generalized): Secondary | ICD-10-CM | POA: Diagnosis not present

## 2022-09-19 DIAGNOSIS — Z9181 History of falling: Secondary | ICD-10-CM | POA: Diagnosis not present

## 2022-09-20 DIAGNOSIS — R41841 Cognitive communication deficit: Secondary | ICD-10-CM | POA: Diagnosis not present

## 2022-09-23 DIAGNOSIS — R41841 Cognitive communication deficit: Secondary | ICD-10-CM | POA: Diagnosis not present

## 2022-09-23 DIAGNOSIS — R2681 Unsteadiness on feet: Secondary | ICD-10-CM | POA: Diagnosis not present

## 2022-09-23 DIAGNOSIS — Z9181 History of falling: Secondary | ICD-10-CM | POA: Diagnosis not present

## 2022-09-23 DIAGNOSIS — R2689 Other abnormalities of gait and mobility: Secondary | ICD-10-CM | POA: Diagnosis not present

## 2022-09-23 DIAGNOSIS — R278 Other lack of coordination: Secondary | ICD-10-CM | POA: Diagnosis not present

## 2022-09-23 DIAGNOSIS — M6281 Muscle weakness (generalized): Secondary | ICD-10-CM | POA: Diagnosis not present

## 2022-09-23 DIAGNOSIS — S72001D Fracture of unspecified part of neck of right femur, subsequent encounter for closed fracture with routine healing: Secondary | ICD-10-CM | POA: Diagnosis not present

## 2022-09-24 DIAGNOSIS — R41841 Cognitive communication deficit: Secondary | ICD-10-CM | POA: Diagnosis not present

## 2022-09-24 DIAGNOSIS — R278 Other lack of coordination: Secondary | ICD-10-CM | POA: Diagnosis not present

## 2022-09-24 DIAGNOSIS — Z9181 History of falling: Secondary | ICD-10-CM | POA: Diagnosis not present

## 2022-09-24 DIAGNOSIS — S72001D Fracture of unspecified part of neck of right femur, subsequent encounter for closed fracture with routine healing: Secondary | ICD-10-CM | POA: Diagnosis not present

## 2022-09-24 DIAGNOSIS — M6281 Muscle weakness (generalized): Secondary | ICD-10-CM | POA: Diagnosis not present

## 2022-09-24 DIAGNOSIS — R2689 Other abnormalities of gait and mobility: Secondary | ICD-10-CM | POA: Diagnosis not present

## 2022-09-24 DIAGNOSIS — R2681 Unsteadiness on feet: Secondary | ICD-10-CM | POA: Diagnosis not present

## 2022-09-26 DIAGNOSIS — Z9181 History of falling: Secondary | ICD-10-CM | POA: Diagnosis not present

## 2022-09-26 DIAGNOSIS — R2689 Other abnormalities of gait and mobility: Secondary | ICD-10-CM | POA: Diagnosis not present

## 2022-09-26 DIAGNOSIS — S72001D Fracture of unspecified part of neck of right femur, subsequent encounter for closed fracture with routine healing: Secondary | ICD-10-CM | POA: Diagnosis not present

## 2022-09-26 DIAGNOSIS — M6281 Muscle weakness (generalized): Secondary | ICD-10-CM | POA: Diagnosis not present

## 2022-09-26 DIAGNOSIS — R2681 Unsteadiness on feet: Secondary | ICD-10-CM | POA: Diagnosis not present

## 2022-09-26 DIAGNOSIS — R278 Other lack of coordination: Secondary | ICD-10-CM | POA: Diagnosis not present

## 2022-09-26 DIAGNOSIS — R41841 Cognitive communication deficit: Secondary | ICD-10-CM | POA: Diagnosis not present

## 2022-10-01 DIAGNOSIS — R2689 Other abnormalities of gait and mobility: Secondary | ICD-10-CM | POA: Diagnosis not present

## 2022-10-01 DIAGNOSIS — S72001D Fracture of unspecified part of neck of right femur, subsequent encounter for closed fracture with routine healing: Secondary | ICD-10-CM | POA: Diagnosis not present

## 2022-10-01 DIAGNOSIS — R41841 Cognitive communication deficit: Secondary | ICD-10-CM | POA: Diagnosis not present

## 2022-10-01 DIAGNOSIS — R2681 Unsteadiness on feet: Secondary | ICD-10-CM | POA: Diagnosis not present

## 2022-10-01 DIAGNOSIS — Z9181 History of falling: Secondary | ICD-10-CM | POA: Diagnosis not present

## 2022-10-01 DIAGNOSIS — R278 Other lack of coordination: Secondary | ICD-10-CM | POA: Diagnosis not present

## 2022-10-01 DIAGNOSIS — M6281 Muscle weakness (generalized): Secondary | ICD-10-CM | POA: Diagnosis not present

## 2022-10-02 DIAGNOSIS — S72001D Fracture of unspecified part of neck of right femur, subsequent encounter for closed fracture with routine healing: Secondary | ICD-10-CM | POA: Diagnosis not present

## 2022-10-02 DIAGNOSIS — Z9181 History of falling: Secondary | ICD-10-CM | POA: Diagnosis not present

## 2022-10-02 DIAGNOSIS — M6281 Muscle weakness (generalized): Secondary | ICD-10-CM | POA: Diagnosis not present

## 2022-10-03 DIAGNOSIS — R2681 Unsteadiness on feet: Secondary | ICD-10-CM | POA: Diagnosis not present

## 2022-10-03 DIAGNOSIS — Z9181 History of falling: Secondary | ICD-10-CM | POA: Diagnosis not present

## 2022-10-03 DIAGNOSIS — M6281 Muscle weakness (generalized): Secondary | ICD-10-CM | POA: Diagnosis not present

## 2022-10-03 DIAGNOSIS — R41841 Cognitive communication deficit: Secondary | ICD-10-CM | POA: Diagnosis not present

## 2022-10-03 DIAGNOSIS — R278 Other lack of coordination: Secondary | ICD-10-CM | POA: Diagnosis not present

## 2022-10-03 DIAGNOSIS — R2689 Other abnormalities of gait and mobility: Secondary | ICD-10-CM | POA: Diagnosis not present

## 2022-10-03 DIAGNOSIS — S72001D Fracture of unspecified part of neck of right femur, subsequent encounter for closed fracture with routine healing: Secondary | ICD-10-CM | POA: Diagnosis not present

## 2022-10-07 ENCOUNTER — Encounter: Payer: Self-pay | Admitting: Neurology

## 2022-10-07 DIAGNOSIS — M6281 Muscle weakness (generalized): Secondary | ICD-10-CM | POA: Diagnosis not present

## 2022-10-07 DIAGNOSIS — R2681 Unsteadiness on feet: Secondary | ICD-10-CM | POA: Diagnosis not present

## 2022-10-07 DIAGNOSIS — R278 Other lack of coordination: Secondary | ICD-10-CM | POA: Diagnosis not present

## 2022-10-07 DIAGNOSIS — R2689 Other abnormalities of gait and mobility: Secondary | ICD-10-CM | POA: Diagnosis not present

## 2022-10-07 DIAGNOSIS — R41841 Cognitive communication deficit: Secondary | ICD-10-CM | POA: Diagnosis not present

## 2022-10-08 DIAGNOSIS — S72001D Fracture of unspecified part of neck of right femur, subsequent encounter for closed fracture with routine healing: Secondary | ICD-10-CM | POA: Diagnosis not present

## 2022-10-08 DIAGNOSIS — M6281 Muscle weakness (generalized): Secondary | ICD-10-CM | POA: Diagnosis not present

## 2022-10-08 DIAGNOSIS — Z9181 History of falling: Secondary | ICD-10-CM | POA: Diagnosis not present

## 2022-10-09 DIAGNOSIS — Z9181 History of falling: Secondary | ICD-10-CM | POA: Diagnosis not present

## 2022-10-09 DIAGNOSIS — R2681 Unsteadiness on feet: Secondary | ICD-10-CM | POA: Diagnosis not present

## 2022-10-09 DIAGNOSIS — S72001D Fracture of unspecified part of neck of right femur, subsequent encounter for closed fracture with routine healing: Secondary | ICD-10-CM | POA: Diagnosis not present

## 2022-10-09 DIAGNOSIS — R2689 Other abnormalities of gait and mobility: Secondary | ICD-10-CM | POA: Diagnosis not present

## 2022-10-09 DIAGNOSIS — R41841 Cognitive communication deficit: Secondary | ICD-10-CM | POA: Diagnosis not present

## 2022-10-09 DIAGNOSIS — M6281 Muscle weakness (generalized): Secondary | ICD-10-CM | POA: Diagnosis not present

## 2022-10-09 DIAGNOSIS — R278 Other lack of coordination: Secondary | ICD-10-CM | POA: Diagnosis not present

## 2022-10-10 DIAGNOSIS — D492 Neoplasm of unspecified behavior of bone, soft tissue, and skin: Secondary | ICD-10-CM | POA: Diagnosis not present

## 2022-10-10 DIAGNOSIS — D225 Melanocytic nevi of trunk: Secondary | ICD-10-CM | POA: Diagnosis not present

## 2022-10-10 DIAGNOSIS — L814 Other melanin hyperpigmentation: Secondary | ICD-10-CM | POA: Diagnosis not present

## 2022-10-10 DIAGNOSIS — L538 Other specified erythematous conditions: Secondary | ICD-10-CM | POA: Diagnosis not present

## 2022-10-10 DIAGNOSIS — Z9181 History of falling: Secondary | ICD-10-CM | POA: Diagnosis not present

## 2022-10-10 DIAGNOSIS — L821 Other seborrheic keratosis: Secondary | ICD-10-CM | POA: Diagnosis not present

## 2022-10-10 DIAGNOSIS — S72001D Fracture of unspecified part of neck of right femur, subsequent encounter for closed fracture with routine healing: Secondary | ICD-10-CM | POA: Diagnosis not present

## 2022-10-10 DIAGNOSIS — B079 Viral wart, unspecified: Secondary | ICD-10-CM | POA: Diagnosis not present

## 2022-10-10 DIAGNOSIS — M6281 Muscle weakness (generalized): Secondary | ICD-10-CM | POA: Diagnosis not present

## 2022-10-15 DIAGNOSIS — R41841 Cognitive communication deficit: Secondary | ICD-10-CM | POA: Diagnosis not present

## 2022-10-15 DIAGNOSIS — R278 Other lack of coordination: Secondary | ICD-10-CM | POA: Diagnosis not present

## 2022-10-15 DIAGNOSIS — R2681 Unsteadiness on feet: Secondary | ICD-10-CM | POA: Diagnosis not present

## 2022-10-15 DIAGNOSIS — R2689 Other abnormalities of gait and mobility: Secondary | ICD-10-CM | POA: Diagnosis not present

## 2022-10-15 DIAGNOSIS — M6281 Muscle weakness (generalized): Secondary | ICD-10-CM | POA: Diagnosis not present

## 2022-10-15 DIAGNOSIS — S72001D Fracture of unspecified part of neck of right femur, subsequent encounter for closed fracture with routine healing: Secondary | ICD-10-CM | POA: Diagnosis not present

## 2022-10-15 DIAGNOSIS — Z9181 History of falling: Secondary | ICD-10-CM | POA: Diagnosis not present

## 2022-10-16 DIAGNOSIS — R2681 Unsteadiness on feet: Secondary | ICD-10-CM | POA: Diagnosis not present

## 2022-10-16 DIAGNOSIS — R278 Other lack of coordination: Secondary | ICD-10-CM | POA: Diagnosis not present

## 2022-10-16 DIAGNOSIS — S72001D Fracture of unspecified part of neck of right femur, subsequent encounter for closed fracture with routine healing: Secondary | ICD-10-CM | POA: Diagnosis not present

## 2022-10-16 DIAGNOSIS — R2689 Other abnormalities of gait and mobility: Secondary | ICD-10-CM | POA: Diagnosis not present

## 2022-10-16 DIAGNOSIS — Z9181 History of falling: Secondary | ICD-10-CM | POA: Diagnosis not present

## 2022-10-16 DIAGNOSIS — M6281 Muscle weakness (generalized): Secondary | ICD-10-CM | POA: Diagnosis not present

## 2022-10-16 DIAGNOSIS — R41841 Cognitive communication deficit: Secondary | ICD-10-CM | POA: Diagnosis not present

## 2022-10-17 DIAGNOSIS — R2689 Other abnormalities of gait and mobility: Secondary | ICD-10-CM | POA: Diagnosis not present

## 2022-10-17 DIAGNOSIS — R41841 Cognitive communication deficit: Secondary | ICD-10-CM | POA: Diagnosis not present

## 2022-10-17 DIAGNOSIS — R2681 Unsteadiness on feet: Secondary | ICD-10-CM | POA: Diagnosis not present

## 2022-10-17 DIAGNOSIS — Z9181 History of falling: Secondary | ICD-10-CM | POA: Diagnosis not present

## 2022-10-17 DIAGNOSIS — R278 Other lack of coordination: Secondary | ICD-10-CM | POA: Diagnosis not present

## 2022-10-17 DIAGNOSIS — S72001D Fracture of unspecified part of neck of right femur, subsequent encounter for closed fracture with routine healing: Secondary | ICD-10-CM | POA: Diagnosis not present

## 2022-10-17 DIAGNOSIS — M6281 Muscle weakness (generalized): Secondary | ICD-10-CM | POA: Diagnosis not present

## 2022-10-22 DIAGNOSIS — R278 Other lack of coordination: Secondary | ICD-10-CM | POA: Diagnosis not present

## 2022-10-22 DIAGNOSIS — R2689 Other abnormalities of gait and mobility: Secondary | ICD-10-CM | POA: Diagnosis not present

## 2022-10-22 DIAGNOSIS — S72001D Fracture of unspecified part of neck of right femur, subsequent encounter for closed fracture with routine healing: Secondary | ICD-10-CM | POA: Diagnosis not present

## 2022-10-22 DIAGNOSIS — Z9181 History of falling: Secondary | ICD-10-CM | POA: Diagnosis not present

## 2022-10-22 DIAGNOSIS — R41841 Cognitive communication deficit: Secondary | ICD-10-CM | POA: Diagnosis not present

## 2022-10-22 DIAGNOSIS — M6281 Muscle weakness (generalized): Secondary | ICD-10-CM | POA: Diagnosis not present

## 2022-10-22 DIAGNOSIS — R2681 Unsteadiness on feet: Secondary | ICD-10-CM | POA: Diagnosis not present

## 2022-10-23 ENCOUNTER — Telehealth: Payer: Self-pay | Admitting: Neurology

## 2022-10-23 DIAGNOSIS — R278 Other lack of coordination: Secondary | ICD-10-CM | POA: Diagnosis not present

## 2022-10-23 DIAGNOSIS — R41841 Cognitive communication deficit: Secondary | ICD-10-CM | POA: Diagnosis not present

## 2022-10-23 DIAGNOSIS — R2689 Other abnormalities of gait and mobility: Secondary | ICD-10-CM | POA: Diagnosis not present

## 2022-10-23 DIAGNOSIS — R2681 Unsteadiness on feet: Secondary | ICD-10-CM | POA: Diagnosis not present

## 2022-10-23 DIAGNOSIS — S72001D Fracture of unspecified part of neck of right femur, subsequent encounter for closed fracture with routine healing: Secondary | ICD-10-CM | POA: Diagnosis not present

## 2022-10-23 DIAGNOSIS — M6281 Muscle weakness (generalized): Secondary | ICD-10-CM | POA: Diagnosis not present

## 2022-10-23 DIAGNOSIS — Z9181 History of falling: Secondary | ICD-10-CM | POA: Diagnosis not present

## 2022-10-23 NOTE — Telephone Encounter (Signed)
Patient's daughter Karle Starch called and left a VM that she will bring the patient to her next appointment in person with Dr. Carles Collet next year.  She then requested medication to be sent in for patient's PD to last until the next appointment.   However, she did not leave the medication or pharmacy details.  I returned call to get more details but got VM and it was full.

## 2022-10-24 DIAGNOSIS — R2689 Other abnormalities of gait and mobility: Secondary | ICD-10-CM | POA: Diagnosis not present

## 2022-10-24 DIAGNOSIS — M6281 Muscle weakness (generalized): Secondary | ICD-10-CM | POA: Diagnosis not present

## 2022-10-24 DIAGNOSIS — S72001D Fracture of unspecified part of neck of right femur, subsequent encounter for closed fracture with routine healing: Secondary | ICD-10-CM | POA: Diagnosis not present

## 2022-10-24 DIAGNOSIS — Z9181 History of falling: Secondary | ICD-10-CM | POA: Diagnosis not present

## 2022-10-24 DIAGNOSIS — R2681 Unsteadiness on feet: Secondary | ICD-10-CM | POA: Diagnosis not present

## 2022-10-24 DIAGNOSIS — R278 Other lack of coordination: Secondary | ICD-10-CM | POA: Diagnosis not present

## 2022-10-24 DIAGNOSIS — R41841 Cognitive communication deficit: Secondary | ICD-10-CM | POA: Diagnosis not present

## 2022-10-24 NOTE — Telephone Encounter (Signed)
Called patients daughter and was unable to leave voicemail mailbox was full

## 2022-10-24 NOTE — Telephone Encounter (Signed)
Called patients daughter again

## 2022-10-25 ENCOUNTER — Encounter: Payer: Self-pay | Admitting: Orthopedic Surgery

## 2022-10-25 ENCOUNTER — Non-Acute Institutional Stay: Payer: PPO | Admitting: Orthopedic Surgery

## 2022-10-25 DIAGNOSIS — E785 Hyperlipidemia, unspecified: Secondary | ICD-10-CM | POA: Diagnosis not present

## 2022-10-25 DIAGNOSIS — I10 Essential (primary) hypertension: Secondary | ICD-10-CM | POA: Diagnosis not present

## 2022-10-25 DIAGNOSIS — I2510 Atherosclerotic heart disease of native coronary artery without angina pectoris: Secondary | ICD-10-CM | POA: Diagnosis not present

## 2022-10-25 DIAGNOSIS — M25551 Pain in right hip: Secondary | ICD-10-CM | POA: Diagnosis not present

## 2022-10-25 DIAGNOSIS — K219 Gastro-esophageal reflux disease without esophagitis: Secondary | ICD-10-CM

## 2022-10-25 DIAGNOSIS — G20A1 Parkinson's disease without dyskinesia, without mention of fluctuations: Secondary | ICD-10-CM

## 2022-10-25 DIAGNOSIS — D649 Anemia, unspecified: Secondary | ICD-10-CM

## 2022-10-25 DIAGNOSIS — N1832 Chronic kidney disease, stage 3b: Secondary | ICD-10-CM

## 2022-10-25 DIAGNOSIS — I2699 Other pulmonary embolism without acute cor pulmonale: Secondary | ICD-10-CM | POA: Diagnosis not present

## 2022-10-25 DIAGNOSIS — I480 Paroxysmal atrial fibrillation: Secondary | ICD-10-CM | POA: Diagnosis not present

## 2022-10-25 DIAGNOSIS — S72001D Fracture of unspecified part of neck of right femur, subsequent encounter for closed fracture with routine healing: Secondary | ICD-10-CM | POA: Diagnosis not present

## 2022-10-25 DIAGNOSIS — M6281 Muscle weakness (generalized): Secondary | ICD-10-CM | POA: Diagnosis not present

## 2022-10-25 DIAGNOSIS — Z9181 History of falling: Secondary | ICD-10-CM | POA: Diagnosis not present

## 2022-10-25 NOTE — Progress Notes (Signed)
Location:  [facility] Room Number: 25/A Place of Service:  ALF [phone]) Provider: Windell Moulding, NP  Patient Care Team: Virgie Dad, MD as PCP - General (Internal Medicine) Sherren Mocha, MD as PCP - Cardiology (Cardiology) [name], MD (Internal Medicine) Tat, Eustace Quail, DO as Consulting Physician (Neurology)  Extended Emergency Contact Information Primary Emergency Contact: Felipa Eth Home Phone: [phone] Mobile Phone: [phone] Relation: Daughter  Code Status:  DNR Goals of care: Advanced Directive information    10/25/2022   12:15 PM  Advanced Directives  Does Patient Have a Medical Advance Directive? Yes  Type of Advance Directive Out of facility DNR (pink MOST or yellow form)  Does patient want to make changes to medical advance directive? No - Patient declined  Pre-existing out of facility DNR order (yellow form or pink MOST form) Yellow form placed in chart (order not valid for inpatient use)     Chief Complaint  Patient presents with   Medication Management    Routine Visit   Quality Metric Gaps    To Discuss the following as listed to the Care Gaps as listed below.     HPI:  Pt is a 88 y.o. female seen today for medical management of chronic diseases.    She currently resides on the assisted living unit at [facility]. PMH: atrial fibrillation, MI, acute PE, DVT, HTN, HLD, GERD, constipation, parkinson's, CKD, and unstable gait.   PAF- followed by cardiology, junctional rhythm noted in preop 07/27, 08/15 she was found to have tachycardia with PAC in ED, she is now on metoprolol and Eliquis HTN- BUN/creat 18/1.12 07/24/2022 HLD-  remains on atorvastatin  CKD 3b- BUN/creat 18/1.12 07/24/2022 CAD- MI in 2010, s/p stent Parkinson's- followed by neurology, remains on sinemet, ambulates with PWC H/o right hemiarthroplasty- 07/26 mechanical fall, followed by Dr. Ninfa Linden, remains WBAT and scheduled tylenol PE/Acute DVT-  08/15 CTA noted PE to lower lobe branch of right pulmonary artery, 08/11 diagnosed with acute DVT right femoral vein, remains on Eliquis Post op anemia- hgb 9.5, ferritin 87, iron 47, TIBC 232 07/23/2022, remains on ferrous sulfate GERD- hgb 9.5, remains on Protonix  No recent falls or injuries.   Recent weights:  11/03- 116.8 lbs  10/06- 118 lbs  08/31- 118 lbs  Recent blood pressures:  11/15- 120/60  11/08- 110/70  11/01- 97/67  Past Medical History:  Diagnosis Date   Coronary artery disease    Methicillin resistant Staphylococcus aureus in conditions classified elsewhere and of unspecified site    MI (myocardial infarction) ([place])    Other and unspecified hyperlipidemia    Unspecified essential hypertension    Past Surgical History:  Procedure Laterality Date   CATARACT EXTRACTION, BILATERAL     CORONARY ANGIOPLASTY WITH STENT PLACEMENT     HIP ARTHROPLASTY Right 07/04/2022   Procedure: RIGHT ANTERIOR HIP REPLACEMENT ;  Surgeon: Mcarthur Rossetti, MD;  Location: [facility];  Service: Orthopedics;  Laterality: Right;    Allergies  Allergen Reactions   Norvasc [Amlodipine] Anaphylaxis, Swelling and Other (See Comments)    Edema   Altace [Ramipril] Cough   Fosamax [Alendronate] Other (See Comments)    Dysphagia    Outpatient Encounter Medications as of 10/25/2022  Medication Sig   acetaminophen (TYLENOL) 325 MG tablet Take 650 mg by mouth 3 (three) times daily.   apixaban (ELIQUIS) 5 MG TABS tablet Take 5 mg by mouth 2 (two) times daily.   atorvastatin (LIPITOR) 10 MG tablet Take  10 mg by mouth at bedtime.   carbidopa-levodopa (SINEMET IR) 25-100 MG tablet Take 1 tablet by mouth 3 (three) times daily.   docusate sodium (COLACE) 100 MG capsule Take 1 capsule (100 mg total) by mouth 2 (two) times daily.   ferrous sulfate 325 (65 FE) MG tablet Take 325 mg by mouth 3 (three) times a week. Monday, Wednesday, and Friday.   furosemide (LASIX) 20 MG tablet Take 20 mg by mouth  daily as needed for fluid.   melatonin 1 MG TABS tablet Take 2 mg by mouth daily.   methocarbamol (ROBAXIN) 500 MG tablet Take 250 mg by mouth at bedtime as needed for muscle spasms. Avoid Norco use only for moderate pain.   pantoprazole (PROTONIX) 20 MG tablet Take 20 mg by mouth daily.   polyethylene glycol (MIRALAX / GLYCOLAX) 17 g packet Take 17 g by mouth daily.   zinc oxide 20 % ointment Apply 1 Application topically as needed for irritation.   apixaban (ELIQUIS) 5 MG TABS tablet Take 10 mg by mouth 2 (two) times daily.   metoprolol tartrate 37.5 MG TABS Take 37.5 mg by mouth 2 (two) times daily.   nitroGLYCERIN (NITROSTAT) 0.4 MG SL tablet Place 1 tablet (0.4 mg total) under the tongue every 5 (five) minutes as needed for chest pain.   No facility-administered encounter medications on file as of 10/25/2022.    Review of Systems  Constitutional:  Negative for activity change, appetite change, chills, fatigue and fever.  HENT:  Negative for congestion and trouble swallowing.   Eyes:  Negative for visual disturbance.  Respiratory:  Negative for cough, shortness of breath and wheezing.   Cardiovascular:  Negative for chest pain and leg swelling.  Gastrointestinal:  Negative for abdominal distention, abdominal pain, constipation, diarrhea, nausea and vomiting.  Genitourinary:  Negative for dysuria, frequency and hematuria.  Musculoskeletal:  Positive for arthralgias and gait problem.  Skin:  Negative for wound.  Neurological:  Positive for weakness. Negative for dizziness and headaches.  Psychiatric/Behavioral:  Negative for confusion, dysphoric mood and sleep disturbance. The patient is not nervous/anxious.     Immunization History  Administered Date(s) Administered   Fluad Quad(high Dose 65+) 10/02/2022   Influenza Split 09/24/2010, 09/04/2011, 09/18/2012, 09/08/2013, 09/09/2015, 10/21/2016, 09/22/2019, 09/13/2021   Influenza-Unspecified 11/09/2014, 09/30/2015, 09/17/2016,  09/01/2017, 09/10/2018   Moderna Sars-Covid-2 Vaccination 12/13/2019, 01/09/2021, 05/16/2021   PFIZER(Purple Top)SARS-COV-2 Vaccination 01/10/2020   Pneumococcal Conjugate-13 04/18/2014   Pneumococcal Polysaccharide-23 09/20/2002, 09/23/2019   Td 10/11/2004   Tdap 06/14/2016   Zoster Recombinat (Shingrix) 02/10/2019, 05/04/2019   Zoster, Live 01/23/2009   Pertinent  Health Maintenance Due  Topic Date Due   INFLUENZA VACCINE  Completed   DEXA SCAN  Completed      07/23/2022    9:46 AM 07/23/2022   10:00 PM 07/24/2022   10:01 AM 09/06/2022   10:30 AM 10/25/2022   12:09 PM  Fall Risk  Falls in the past year?    1 1  Was there an injury with Fall?    1 1  Fall Risk Category Calculator    3 3  Fall Risk Category    High High  Patient Fall Risk Level Moderate fall risk Moderate fall risk Low fall risk High fall risk High fall risk  Patient at Risk for Falls Due to    History of fall(s);Impaired balance/gait;Impaired mobility History of fall(s)  Fall risk Follow up    Falls evaluation completed;Education provided;Falls prevention discussed Falls evaluation completed  Functional Status Survey:    Vitals:   10/25/22 1146  BP: 120/60  Pulse: 62  Resp: 18  Temp: (!) 96.2 F (35.7 C)  SpO2: 98%  Weight: 116 lb 8 oz (52.8 kg)  Height: '5\' 5"'$  (1.651 m)   Body mass index is 19.39 kg/m. Physical Exam Vitals reviewed.  Constitutional:      General: She is not in acute distress. HENT:     Head: Normocephalic.  Eyes:     General:        Right eye: No discharge.        Left eye: No discharge.  Cardiovascular:     Rate and Rhythm: Normal rate and regular rhythm.     Pulses: Normal pulses.     Heart sounds: Normal heart sounds.  Pulmonary:     Effort: Pulmonary effort is normal. No respiratory distress.     Breath sounds: Normal breath sounds. No wheezing.  Abdominal:     General: Bowel sounds are normal. There is no distension.     Palpations: Abdomen is soft.      Tenderness: There is no abdominal tenderness.  Musculoskeletal:     Cervical back: Neck supple.     Right lower leg: No edema.     Left lower leg: No edema.  Skin:    General: Skin is warm and dry.     Capillary Refill: Capillary refill takes less than 2 seconds.  Neurological:     General: No focal deficit present.     Mental Status: She is alert and oriented to person, place, and time.     Motor: Weakness present.     Gait: Gait abnormal.     Comments: Ambulates with PWC  Psychiatric:        Mood and Affect: Mood normal.        Behavior: Behavior normal.     Labs reviewed: Recent Labs    07/05/22 0335 07/06/22 0403 07/23/22 1118 07/24/22 0309  NA 142 139 141 138  K 3.4* 4.3 4.8 4.1  CL 110 109 111 110  CO2 24 24 21* 20*  GLUCOSE 96 98 88 79  BUN 52* 36* 17 18  CREATININE 1.49* 1.29* 1.12* 1.12*  CALCIUM 7.8* 8.2* 8.5* 8.4*  MG 1.9 1.8 1.9  --    Recent Labs    07/04/22 0340  AST 18  ALT 6  ALKPHOS 74  BILITOT 0.7  PROT 6.1*  ALBUMIN 3.3*   Recent Labs    07/05/22 0335 07/06/22 0403 07/23/22 1118 07/24/22 0309  WBC 6.6 6.2 7.3 4.6  NEUTROABS 4.7 4.6 6.1  --   HGB 9.6* 9.3* 9.9* 9.5*  HCT 29.5* 28.7* 30.6* 32.0*  MCV 89.4 89.7 92.4 100.3*  PLT 145* 144* 357 293   Lab Results  Component Value Date   TSH 0.434 07/23/2022   No results found for: "HGBA1C" Lab Results  Component Value Date   CHOL 145 09/22/2009   HDL 52.60 09/22/2009   LDLCALC 73 09/22/2009   TRIG 97.0 09/22/2009   CHOLHDL 3 09/22/2009    Significant Diagnostic Results in last 30 days:  No results found.  Assessment/Plan 1. Paroxysmal atrial fibrillation (HCC) - followed by cardiology - HR controlled with metoprolol - cont Eliquis for clot prevention  2. Essential hypertension - controlled - cont metoprolol  3. Hyperlipidemia, unspecified hyperlipidemia type - cont statin  4. Stage 3b chronic kidney disease ([place]) - avoid nephrotoxic drugs like NSAIDS and dose  adjust medications to  be renally excreted - encourage hydration with water   5. Atherosclerosis of native coronary artery without angina pectoris, unspecified whether native or transplanted heart - MI 2010 - s/p sten - cont statin  6. Parkinson's disease without dyskinesia or fluctuating manifestations - followed by neurology - cont sinemet  7. Acute pulmonary embolism without acute cor pulmonale, unspecified pulmonary embolism type ([place]) - -08/11 DVT to right femoral vein - 08/15 CTA revealed lower lobe branch of right pulmonary artery - cont Eliquis  9. Right hip pain - s/p right hemiarthroplasty 07/27 - followed by Dr. Ninfa Linden - WBAT - cont tylenol   10. Normocytic anemia - hgb stable - cont ferrous sulfate  11. Gastroesophageal reflux disease, unspecified whether esophagitis present - cont Protonix   Family/ staff Communication: plan discussed with patient and nurse  Labs/tests ordered:  none

## 2022-10-27 DIAGNOSIS — R2681 Unsteadiness on feet: Secondary | ICD-10-CM | POA: Diagnosis not present

## 2022-10-27 DIAGNOSIS — R2689 Other abnormalities of gait and mobility: Secondary | ICD-10-CM | POA: Diagnosis not present

## 2022-10-27 DIAGNOSIS — R41841 Cognitive communication deficit: Secondary | ICD-10-CM | POA: Diagnosis not present

## 2022-10-27 DIAGNOSIS — M6281 Muscle weakness (generalized): Secondary | ICD-10-CM | POA: Diagnosis not present

## 2022-10-27 DIAGNOSIS — R278 Other lack of coordination: Secondary | ICD-10-CM | POA: Diagnosis not present

## 2022-10-29 DIAGNOSIS — M6281 Muscle weakness (generalized): Secondary | ICD-10-CM | POA: Diagnosis not present

## 2022-10-29 DIAGNOSIS — R2689 Other abnormalities of gait and mobility: Secondary | ICD-10-CM | POA: Diagnosis not present

## 2022-10-29 DIAGNOSIS — Z9181 History of falling: Secondary | ICD-10-CM | POA: Diagnosis not present

## 2022-10-29 DIAGNOSIS — S72001D Fracture of unspecified part of neck of right femur, subsequent encounter for closed fracture with routine healing: Secondary | ICD-10-CM | POA: Diagnosis not present

## 2022-10-29 DIAGNOSIS — R41841 Cognitive communication deficit: Secondary | ICD-10-CM | POA: Diagnosis not present

## 2022-10-29 DIAGNOSIS — R278 Other lack of coordination: Secondary | ICD-10-CM | POA: Diagnosis not present

## 2022-10-29 DIAGNOSIS — R2681 Unsteadiness on feet: Secondary | ICD-10-CM | POA: Diagnosis not present

## 2022-10-30 DIAGNOSIS — Z9181 History of falling: Secondary | ICD-10-CM | POA: Diagnosis not present

## 2022-10-30 DIAGNOSIS — M6281 Muscle weakness (generalized): Secondary | ICD-10-CM | POA: Diagnosis not present

## 2022-10-30 DIAGNOSIS — S72001D Fracture of unspecified part of neck of right femur, subsequent encounter for closed fracture with routine healing: Secondary | ICD-10-CM | POA: Diagnosis not present

## 2022-11-01 DIAGNOSIS — S72001D Fracture of unspecified part of neck of right femur, subsequent encounter for closed fracture with routine healing: Secondary | ICD-10-CM | POA: Diagnosis not present

## 2022-11-01 DIAGNOSIS — M6281 Muscle weakness (generalized): Secondary | ICD-10-CM | POA: Diagnosis not present

## 2022-11-01 DIAGNOSIS — Z9181 History of falling: Secondary | ICD-10-CM | POA: Diagnosis not present

## 2022-11-05 DIAGNOSIS — R278 Other lack of coordination: Secondary | ICD-10-CM | POA: Diagnosis not present

## 2022-11-05 DIAGNOSIS — R2681 Unsteadiness on feet: Secondary | ICD-10-CM | POA: Diagnosis not present

## 2022-11-05 DIAGNOSIS — S72001D Fracture of unspecified part of neck of right femur, subsequent encounter for closed fracture with routine healing: Secondary | ICD-10-CM | POA: Diagnosis not present

## 2022-11-05 DIAGNOSIS — M6281 Muscle weakness (generalized): Secondary | ICD-10-CM | POA: Diagnosis not present

## 2022-11-05 DIAGNOSIS — R2689 Other abnormalities of gait and mobility: Secondary | ICD-10-CM | POA: Diagnosis not present

## 2022-11-05 DIAGNOSIS — Z9181 History of falling: Secondary | ICD-10-CM | POA: Diagnosis not present

## 2022-11-05 DIAGNOSIS — R41841 Cognitive communication deficit: Secondary | ICD-10-CM | POA: Diagnosis not present

## 2022-11-06 DIAGNOSIS — M6281 Muscle weakness (generalized): Secondary | ICD-10-CM | POA: Diagnosis not present

## 2022-11-06 DIAGNOSIS — S72001D Fracture of unspecified part of neck of right femur, subsequent encounter for closed fracture with routine healing: Secondary | ICD-10-CM | POA: Diagnosis not present

## 2022-11-06 DIAGNOSIS — Z9181 History of falling: Secondary | ICD-10-CM | POA: Diagnosis not present

## 2022-11-07 DIAGNOSIS — R2681 Unsteadiness on feet: Secondary | ICD-10-CM | POA: Diagnosis not present

## 2022-11-07 DIAGNOSIS — R2689 Other abnormalities of gait and mobility: Secondary | ICD-10-CM | POA: Diagnosis not present

## 2022-11-07 DIAGNOSIS — R278 Other lack of coordination: Secondary | ICD-10-CM | POA: Diagnosis not present

## 2022-11-07 DIAGNOSIS — R41841 Cognitive communication deficit: Secondary | ICD-10-CM | POA: Diagnosis not present

## 2022-11-07 DIAGNOSIS — M6281 Muscle weakness (generalized): Secondary | ICD-10-CM | POA: Diagnosis not present

## 2022-11-08 DIAGNOSIS — R2689 Other abnormalities of gait and mobility: Secondary | ICD-10-CM | POA: Diagnosis not present

## 2022-11-08 DIAGNOSIS — M6281 Muscle weakness (generalized): Secondary | ICD-10-CM | POA: Diagnosis not present

## 2022-11-08 DIAGNOSIS — R278 Other lack of coordination: Secondary | ICD-10-CM | POA: Diagnosis not present

## 2022-11-08 DIAGNOSIS — R2681 Unsteadiness on feet: Secondary | ICD-10-CM | POA: Diagnosis not present

## 2022-11-08 DIAGNOSIS — R41841 Cognitive communication deficit: Secondary | ICD-10-CM | POA: Diagnosis not present

## 2022-11-09 DIAGNOSIS — R278 Other lack of coordination: Secondary | ICD-10-CM | POA: Diagnosis not present

## 2022-11-09 DIAGNOSIS — R41841 Cognitive communication deficit: Secondary | ICD-10-CM | POA: Diagnosis not present

## 2022-11-09 DIAGNOSIS — M6281 Muscle weakness (generalized): Secondary | ICD-10-CM | POA: Diagnosis not present

## 2022-11-09 DIAGNOSIS — R2689 Other abnormalities of gait and mobility: Secondary | ICD-10-CM | POA: Diagnosis not present

## 2022-11-09 DIAGNOSIS — R2681 Unsteadiness on feet: Secondary | ICD-10-CM | POA: Diagnosis not present

## 2022-11-12 DIAGNOSIS — R278 Other lack of coordination: Secondary | ICD-10-CM | POA: Diagnosis not present

## 2022-11-12 DIAGNOSIS — R41841 Cognitive communication deficit: Secondary | ICD-10-CM | POA: Diagnosis not present

## 2022-11-12 DIAGNOSIS — R2681 Unsteadiness on feet: Secondary | ICD-10-CM | POA: Diagnosis not present

## 2022-11-12 DIAGNOSIS — R2689 Other abnormalities of gait and mobility: Secondary | ICD-10-CM | POA: Diagnosis not present

## 2022-11-12 DIAGNOSIS — M6281 Muscle weakness (generalized): Secondary | ICD-10-CM | POA: Diagnosis not present

## 2022-11-13 DIAGNOSIS — M6281 Muscle weakness (generalized): Secondary | ICD-10-CM | POA: Diagnosis not present

## 2022-11-13 DIAGNOSIS — R2689 Other abnormalities of gait and mobility: Secondary | ICD-10-CM | POA: Diagnosis not present

## 2022-11-13 DIAGNOSIS — R41841 Cognitive communication deficit: Secondary | ICD-10-CM | POA: Diagnosis not present

## 2022-11-13 DIAGNOSIS — R278 Other lack of coordination: Secondary | ICD-10-CM | POA: Diagnosis not present

## 2022-11-13 DIAGNOSIS — R2681 Unsteadiness on feet: Secondary | ICD-10-CM | POA: Diagnosis not present

## 2022-11-18 DIAGNOSIS — R278 Other lack of coordination: Secondary | ICD-10-CM | POA: Diagnosis not present

## 2022-11-18 DIAGNOSIS — R41841 Cognitive communication deficit: Secondary | ICD-10-CM | POA: Diagnosis not present

## 2022-11-18 DIAGNOSIS — R2681 Unsteadiness on feet: Secondary | ICD-10-CM | POA: Diagnosis not present

## 2022-11-18 DIAGNOSIS — M6281 Muscle weakness (generalized): Secondary | ICD-10-CM | POA: Diagnosis not present

## 2022-11-18 DIAGNOSIS — R2689 Other abnormalities of gait and mobility: Secondary | ICD-10-CM | POA: Diagnosis not present

## 2022-11-19 DIAGNOSIS — R2681 Unsteadiness on feet: Secondary | ICD-10-CM | POA: Diagnosis not present

## 2022-11-19 DIAGNOSIS — R41841 Cognitive communication deficit: Secondary | ICD-10-CM | POA: Diagnosis not present

## 2022-11-19 DIAGNOSIS — R278 Other lack of coordination: Secondary | ICD-10-CM | POA: Diagnosis not present

## 2022-11-19 DIAGNOSIS — R2689 Other abnormalities of gait and mobility: Secondary | ICD-10-CM | POA: Diagnosis not present

## 2022-11-19 DIAGNOSIS — M6281 Muscle weakness (generalized): Secondary | ICD-10-CM | POA: Diagnosis not present

## 2022-11-20 DIAGNOSIS — R2689 Other abnormalities of gait and mobility: Secondary | ICD-10-CM | POA: Diagnosis not present

## 2022-11-20 DIAGNOSIS — M6281 Muscle weakness (generalized): Secondary | ICD-10-CM | POA: Diagnosis not present

## 2022-11-20 DIAGNOSIS — R41841 Cognitive communication deficit: Secondary | ICD-10-CM | POA: Diagnosis not present

## 2022-11-20 DIAGNOSIS — R278 Other lack of coordination: Secondary | ICD-10-CM | POA: Diagnosis not present

## 2022-11-20 DIAGNOSIS — R2681 Unsteadiness on feet: Secondary | ICD-10-CM | POA: Diagnosis not present

## 2022-11-22 DIAGNOSIS — R2689 Other abnormalities of gait and mobility: Secondary | ICD-10-CM | POA: Diagnosis not present

## 2022-11-22 DIAGNOSIS — R2681 Unsteadiness on feet: Secondary | ICD-10-CM | POA: Diagnosis not present

## 2022-11-22 DIAGNOSIS — R41841 Cognitive communication deficit: Secondary | ICD-10-CM | POA: Diagnosis not present

## 2022-11-22 DIAGNOSIS — R278 Other lack of coordination: Secondary | ICD-10-CM | POA: Diagnosis not present

## 2022-11-22 DIAGNOSIS — M6281 Muscle weakness (generalized): Secondary | ICD-10-CM | POA: Diagnosis not present

## 2023-01-02 ENCOUNTER — Encounter: Payer: Self-pay | Admitting: Internal Medicine

## 2023-01-02 ENCOUNTER — Non-Acute Institutional Stay: Payer: PPO | Admitting: Internal Medicine

## 2023-01-02 DIAGNOSIS — I10 Essential (primary) hypertension: Secondary | ICD-10-CM

## 2023-01-02 DIAGNOSIS — I82411 Acute embolism and thrombosis of right femoral vein: Secondary | ICD-10-CM

## 2023-01-02 DIAGNOSIS — N1831 Chronic kidney disease, stage 3a: Secondary | ICD-10-CM | POA: Diagnosis not present

## 2023-01-02 DIAGNOSIS — I2693 Single subsegmental pulmonary embolism without acute cor pulmonale: Secondary | ICD-10-CM

## 2023-01-02 DIAGNOSIS — E785 Hyperlipidemia, unspecified: Secondary | ICD-10-CM

## 2023-01-02 DIAGNOSIS — G20A1 Parkinson's disease without dyskinesia, without mention of fluctuations: Secondary | ICD-10-CM | POA: Diagnosis not present

## 2023-01-02 DIAGNOSIS — I471 Supraventricular tachycardia, unspecified: Secondary | ICD-10-CM | POA: Diagnosis not present

## 2023-01-02 NOTE — Progress Notes (Signed)
Location: [facility] Room Number: 25A Place of Service:  ALF (13)  Provider:   Code Status: DNR Goals of Care:     01/02/2023    2:16 PM  Advanced Directives  Does Patient Have a Medical Advance Directive? Yes  Type of Advance Directive Out of facility DNR (pink MOST or yellow form)  Does patient want to make changes to medical advance directive? No - Patient declined     Chief Complaint  Patient presents with   Medical Management of Chronic Issues    Patient is beings seen for a follow up    HPI: Patient is a 89 y.o. female seen today for an acute visit to Evaluate for Eliquis  Lives iN AL in [facility] Uses walker and Power Chair to ambulate  Patient Has h/o Right Leg DVT and PE after Right hip hemiarthroplasty  H/o PSVT  On Metoprolol   GERD, HLD,HTN, CKD ,stage 3 a and Parkinson disease Cognitive impairment  Did not have any complains One recent fall but no injuries  No Bleeding Tolerating her Eliquis.    Past Medical History:  Diagnosis Date   Coronary artery disease    Methicillin resistant Staphylococcus aureus in conditions classified elsewhere and of unspecified site    MI (myocardial infarction) ([place])    Other and unspecified hyperlipidemia    Unspecified essential hypertension     Past Surgical History:  Procedure Laterality Date   CATARACT EXTRACTION, BILATERAL     CORONARY ANGIOPLASTY WITH STENT PLACEMENT     HIP ARTHROPLASTY Right 07/04/2022   Procedure: RIGHT ANTERIOR HIP REPLACEMENT ;  Surgeon: Mcarthur Rossetti, MD;  Location: [facility];  Service: Orthopedics;  Laterality: Right;    Allergies  Allergen Reactions   Norvasc [Amlodipine] Anaphylaxis, Swelling and Other (See Comments)    Edema   Altace [Ramipril] Cough   Fosamax [Alendronate] Other (See Comments)    Dysphagia    Outpatient Encounter Medications as of 01/02/2023  Medication Sig   acetaminophen (TYLENOL) 325 MG tablet Take 650 mg by mouth 3 (three)  times daily.   apixaban (ELIQUIS) 5 MG TABS tablet Take 5 mg by mouth 2 (two) times daily.   atorvastatin (LIPITOR) 10 MG tablet Take 10 mg by mouth at bedtime.   carbidopa-levodopa (SINEMET IR) 25-100 MG tablet Take 1 tablet by mouth 3 (three) times daily.   docusate sodium (COLACE) 100 MG capsule Take 1 capsule (100 mg total) by mouth 2 (two) times daily.   ferrous sulfate 325 (65 FE) MG tablet Take 325 mg by mouth 3 (three) times a week. Monday, Wednesday, and Friday.   furosemide (LASIX) 20 MG tablet Take 20 mg by mouth daily as needed for fluid.   melatonin 1 MG TABS tablet Take 2 mg by mouth daily.   methocarbamol (ROBAXIN) 500 MG tablet Take 250 mg by mouth at bedtime as needed for muscle spasms. Avoid Norco use only for moderate pain.   pantoprazole (PROTONIX) 20 MG tablet Take 20 mg by mouth daily.   polyethylene glycol (MIRALAX / GLYCOLAX) 17 g packet Take 17 g by mouth daily.   zinc oxide 20 % ointment Apply 1 Application topically as needed for irritation.   metoprolol tartrate 37.5 MG TABS Take 37.5 mg by mouth 2 (two) times daily.   nitroGLYCERIN (NITROSTAT) 0.4 MG SL tablet Place 1 tablet (0.4 mg total) under the tongue every 5 (five) minutes as needed for chest pain.   [DISCONTINUED] apixaban (ELIQUIS) 5 MG  TABS tablet Take 10 mg by mouth 2 (two) times daily.   No facility-administered encounter medications on file as of 01/02/2023.    Review of Systems:  Review of Systems  Constitutional:  Negative for activity change and appetite change.  HENT: Negative.    Respiratory:  Negative for cough and shortness of breath.   Cardiovascular:  Negative for leg swelling.  Gastrointestinal:  Negative for constipation.  Genitourinary: Negative.   Musculoskeletal:  Positive for gait problem. Negative for arthralgias and myalgias.  Skin: Negative.   Neurological:  Negative for dizziness and weakness.  Psychiatric/Behavioral:  Negative for confusion, dysphoric mood and sleep  disturbance.     Health Maintenance  Topic Date Due   COVID-19 Vaccine (6 - 2023-24 season) 01/18/2023 (Originally 12/10/2022)   Medicare Annual Wellness (AWV)  09/07/2023   DTaP/Tdap/Td (3 - Td or Tdap) 06/14/2026   Pneumonia Vaccine 48+ Years old  Completed   INFLUENZA VACCINE  Completed   DEXA SCAN  Completed   Zoster Vaccines- Shingrix  Completed   HPV VACCINES  Aged Out    Physical Exam: Vitals:   01/02/23 1352  BP: 125/63  Pulse: 60  Resp: 18  Temp: 97.9 F (36.6 C)  TempSrc: Temporal  SpO2: 99%  Weight: 119 lb (54 kg)  Height: '5\' 5"'$  (1.651 m)   Body mass index is 19.8 kg/m. Physical Exam Vitals reviewed.  Constitutional:      Appearance: Normal appearance.  HENT:     Head: Normocephalic.     Nose: Nose normal.     Mouth/Throat:     Mouth: Mucous membranes are moist.     Pharynx: Oropharynx is clear.  Eyes:     Pupils: Pupils are equal, round, and reactive to light.  Cardiovascular:     Rate and Rhythm: Normal rate and regular rhythm.     Pulses: Normal pulses.     Heart sounds: Normal heart sounds. No murmur heard. Pulmonary:     Effort: Pulmonary effort is normal.     Breath sounds: Normal breath sounds.  Abdominal:     General: Abdomen is flat. Bowel sounds are normal.     Palpations: Abdomen is soft.  Musculoskeletal:        General: No swelling.     Cervical back: Neck supple.  Skin:    General: Skin is warm.  Neurological:     General: No focal deficit present.     Mental Status: She is alert.  Psychiatric:        Mood and Affect: Mood normal.        Thought Content: Thought content normal.     Labs reviewed: Basic Metabolic Panel: Recent Labs    07/05/22 0335 07/06/22 0403 07/23/22 1118 07/24/22 0309  NA 142 139 141 138  K 3.4* 4.3 4.8 4.1  CL 110 109 111 110  CO2 24 24 21* 20*  GLUCOSE 96 98 88 79  BUN 52* 36* 17 18  CREATININE 1.49* 1.29* 1.12* 1.12*  CALCIUM 7.8* 8.2* 8.5* 8.4*  MG 1.9 1.8 1.9  --   TSH  --   --  0.434   --    Liver Function Tests: Recent Labs    07/04/22 0340  AST 18  ALT 6  ALKPHOS 74  BILITOT 0.7  PROT 6.1*  ALBUMIN 3.3*   No results for input(s): "LIPASE", "AMYLASE" in the last 8760 hours. No results for input(s): "AMMONIA" in the last 8760 hours. CBC: Recent Labs  07/05/22 0335 07/06/22 0403 07/23/22 1118 07/24/22 0309  WBC 6.6 6.2 7.3 4.6  NEUTROABS 4.7 4.6 6.1  --   HGB 9.6* 9.3* 9.9* 9.5*  HCT 29.5* 28.7* 30.6* 32.0*  MCV 89.4 89.7 92.4 100.3*  PLT 145* 144* 357 293   Lipid Panel: No results for input(s): "CHOL", "HDL", "LDLCALC", "TRIG", "CHOLHDL", "LDLDIRECT" in the last 8760 hours. No results found for: "HGBA1C"  Procedures since last visit: No results found.  Assessment/Plan 1. Single subsegmental pulmonary embolism without acute cor pulmonale (HCC) On Eliquis since 08/23 Will Discontinue Eliquis in 02/24  2.Provoked  Acute deep vein thrombosis (DVT) of femoral vein of right lower extremity (HCC) Eliquis for 6 months till 02/24  3. SVT (supraventricular tachycardia)/ Per Zio patch monitor no A Fib Burden Will D/W Dr Cooper/ Appointment to make sure it was ok to discontinue Eliquis  On Metoprolol  4. Parkinson's disease without dyskinesia or fluctuating manifestations Follows with Dr Tat On Sinemet  5. Essential hypertension Metoprolol  6. Hyperlipidemia, unspecified hyperlipidemia type Statin Repeat Lipid  7. Stage 3a chronic kidney disease (HCC) Creat better Repeat BMP  8 Anemia On Iron Repeat CBC  Labs/tests ordered:  CBC,CMP Lipid  Next appt:  Visit date not found

## 2023-01-03 ENCOUNTER — Encounter: Payer: Self-pay | Admitting: Internal Medicine

## 2023-01-06 DIAGNOSIS — D649 Anemia, unspecified: Secondary | ICD-10-CM | POA: Diagnosis not present

## 2023-01-06 DIAGNOSIS — E785 Hyperlipidemia, unspecified: Secondary | ICD-10-CM | POA: Diagnosis not present

## 2023-01-06 LAB — LIPID PANEL
Cholesterol: 142 (ref 0–200)
HDL: 46 (ref 35–70)
LDL Cholesterol: 76
Triglycerides: 121 (ref 40–160)

## 2023-01-06 LAB — COMPREHENSIVE METABOLIC PANEL
Albumin: 3.6 (ref 3.5–5.0)
Calcium: 8.8 (ref 8.7–10.7)
Globulin: 2.1

## 2023-01-06 LAB — HEPATIC FUNCTION PANEL
ALT: 3 U/L — AB (ref 7–35)
AST: 12 — AB (ref 13–35)
Alkaline Phosphatase: 74 (ref 25–125)
Bilirubin, Total: 0.5

## 2023-01-06 LAB — BASIC METABOLIC PANEL
BUN: 33 — AB (ref 4–21)
CO2: 26 — AB (ref 13–22)
Chloride: 108 (ref 99–108)
Creatinine: 1.1 (ref 0.5–1.1)
Glucose: 84
Potassium: 4.3 mEq/L (ref 3.5–5.1)
Sodium: 141 (ref 137–147)

## 2023-01-06 LAB — CBC AND DIFFERENTIAL
HCT: 35 — AB (ref 36–46)
Hemoglobin: 11.7 — AB (ref 12.0–16.0)
Neutrophils Absolute: 3646
Platelets: 215 10*3/uL (ref 150–400)
WBC: 5.6

## 2023-01-06 LAB — CBC: RBC: 3.84 — AB (ref 3.87–5.11)

## 2023-01-28 ENCOUNTER — Ambulatory Visit: Payer: PPO | Admitting: Neurology

## 2023-02-03 NOTE — Progress Notes (Unsigned)
Assessment/Plan:   1.  Parkinsons Disease             -Continue carbidopa/levodopa 25/100, 1 tablet 3 times per day.               -She had some recent increased falls, but this is post hip fracture and during a time where she was positive for COVID.  I do not think that this is a primary Parkinson's issue and I am not going to change medication.   2.  DVT and subsequent PE, post hip fracture             -Eliquis has now been discontinued   Subjective:   Taylor Mendoza was seen today in follow up for Parkinsons disease.  My previous records were reviewed prior to todays visit as well as outside records available to me.  Pt with daughter who supplements the history.   Patient was in the emergency room at the end of July after a fall and subsequently sustained a right hip fracture.  She underwent right hip hemiarthroplasty July 27 with Dr. Ninfa Linden.  [facility].  Her stay was complicated by a DVT and she was placed on Eliquis.  Not long thereafter (3 days after the DVT diagnosis), she was readmitted to the hospital with an acute pulmonary embolism.  She has been back to the nursing facility.  Nursing facility notes indicate that she fell again on September 9, but did decline x-ray done.  She has a follow-up with Dr. Ninfa Linden September 26.  She also tested positive for COVID September 11.  She had a f/u with me late sept and again in Oct but couldn't get video to connect properly either time so was r/s for today.  I did review recent primary care notes from her January 25 visit.  Her Eliquis has now been discontinued.  Current prescribed movement disorder medications: Carbidopa/levodopa 25/100, 1 tablet 3 times per day    ALLERGIES:   Allergies  Allergen Reactions   Norvasc [Amlodipine] Anaphylaxis, Swelling and Other (See Comments)    Edema   Altace [Ramipril] Cough   Fosamax [Alendronate] Other (See Comments)    Dysphagia    CURRENT  MEDICATIONS:  Outpatient Encounter Medications as of 02/05/2023  Medication Sig   acetaminophen (TYLENOL) 325 MG tablet Take 650 mg by mouth 3 (three) times daily.   apixaban (ELIQUIS) 5 MG TABS tablet Take 5 mg by mouth 2 (two) times daily.   atorvastatin (LIPITOR) 10 MG tablet Take 10 mg by mouth at bedtime.   carbidopa-levodopa (SINEMET IR) 25-100 MG tablet Take 1 tablet by mouth 3 (three) times daily.   docusate sodium (COLACE) 100 MG capsule Take 1 capsule (100 mg total) by mouth 2 (two) times daily.   ferrous sulfate 325 (65 FE) MG tablet Take 325 mg by mouth 3 (three) times a week. Monday, Wednesday, and Friday.   furosemide (LASIX) 20 MG tablet Take 20 mg by mouth daily as needed for fluid.   melatonin 1 MG TABS tablet Take 2 mg by mouth daily.   methocarbamol (ROBAXIN) 500 MG tablet Take 250 mg by mouth at bedtime as needed for muscle spasms. Avoid Norco use only for moderate pain.   metoprolol tartrate 37.5 MG TABS Take 37.5 mg by mouth 2 (two) times daily.   nitroGLYCERIN (NITROSTAT) 0.4 MG SL tablet Place 1 tablet (0.4 mg total) under the tongue every 5 (five) minutes as needed  for chest pain.   pantoprazole (PROTONIX) 20 MG tablet Take 20 mg by mouth daily.   polyethylene glycol (MIRALAX / GLYCOLAX) 17 g packet Take 17 g by mouth daily.   zinc oxide 20 % ointment Apply 1 Application topically as needed for irritation.   No facility-administered encounter medications on file as of 02/05/2023.    Objective:   PHYSICAL EXAMINATION:    VITALS:   There were no vitals filed for this visit.  Wt Readings from Last 3 Encounters:  01/02/23 119 lb (54 kg)  10/25/22 116 lb 8 oz (52.8 kg)  09/06/22 118 lb (53.5 kg)      GEN:  The patient appears stated age and is in NAD. HEENT:  Normocephalic, atraumatic.  The mucous membranes are moist. The superficial temporal arteries are without ropiness or tenderness. CV:  RRR Lungs:  CTAB Neck/HEME:  There are no carotid bruits  bilaterally.  Neurological examination:  Orientation: The patient is alert and oriented x3. Cranial nerves: There is good facial symmetry with min facial hypomimia. The speech is fluent and clear. Soft palate rises symmetrically and there is no tongue deviation. Hearing is intact to conversational tone. Sensation: Sensation is intact to light touch throughout Motor: Strength is at least antigravity x4.  Movement examination: Tone: There is normal tone today Abnormal movements: none Coordination:  There is no decremation today Gait and Station: Patient pushes off of the chair to arise.  The patient's stride length is slightly decreased but she is wide based.  She has a valgus knee deformity    Cc:  Virgie Dad, MD

## 2023-02-05 ENCOUNTER — Ambulatory Visit: Payer: PPO | Admitting: Neurology

## 2023-02-05 ENCOUNTER — Encounter: Payer: Self-pay | Admitting: Neurology

## 2023-02-05 VITALS — BP 112/78 | HR 58 | Ht 66.0 in | Wt 118.7 lb

## 2023-02-05 DIAGNOSIS — G20A1 Parkinson's disease without dyskinesia, without mention of fluctuations: Secondary | ICD-10-CM

## 2023-02-05 NOTE — Patient Instructions (Addendum)
I want you to take your carbidopa/levodopa 25/100 at 7am/11am/4pm   As a reminder, carbidopa/levodopa can be taken at the same time as a carbohydrate, but we like to have you take your pill either 30 minutes before a protein source or 1 hour after as protein can interfere with carbidopa/levodopa absorption.   Local and Online Resources for Power over Parkinson's Group  February 2024   LOCAL [place] PARKINSON'S GROUPS   Power over Parkinson's Group:    Power Over Parkinson's Patient Education Group will be Wednesday, February 14th-*Hybrid meting*- in person at [facility] location and via [facility], 2:00-3:00 pm.   Starting in November, Power over [employer] and Care Partner Groups will meet together, with plans for separate break out session for caregivers (*this will be evolving over the next few months) Upcoming Power over Parkinson's Meetings/Care Partner Support:  2nd Wednesdays of the month at 2 pm:  February 14th, March 13th  Benjamin Perez at [email or web address] if interested in participating in this group    [place]! Moves Classes in [place]!  Starting January 02, 2023.  Thursdays, January 25-February 13, 2023 at 11:45 am.  [facility].  Free to participate (through grant with [employer]) and registration required.  Please contact Corwin Levins at [email or web address] to register. Let's Try Pickleball-$25 for 6 weeks of Pickleball, starting February 2nd.  Contact Corwin Levins for more details.  [email or web address] Parkinson's Community Game Night at [facility], Date TBA in Late February, email Sarah for details [email or web address] THRIVE, an exercise group for early-onset Parkinson's Disease will resume meeting in February, email Sarah for details [email or web address] Parkinson's CarePartner Group for Men is in the works, if interested email [email or web address] ACT FITNESS Chair Yoga classes "Train and Gain", Fridays 10 am, ACT Fitness.  Contact Gina at [phone].  PWR! Moves Dynegy Instructor-Led Classes offering at [employer]!  TUESDAYS and Wednesdays 1-2 pm.   Contact Vonna Kotyk at  [email or web address]  or [phone] (Tuesday classes are modified for chair and standing only) Drumming for Parkinson's will be held on 2nd and 4th Mondays at 11:00 am.   Located at the [place] ([place].)  Contact Doylene Canning at [email or web address] or [phone]  Dance for Parkinson 's classes will be on Tuesdays 9:30am-10:30am starting back in February. Located in the [employer] , in the first floor of the [employer] ([place] To register:  [email or web address] or [phone] [facility] Parkinson's Tai Chi Class, Mondays at 11 am.  Call [phone] for details SAVE THE DATE and REGISTER:  [place] Chapter of [place]:  [employer].  Conversations about Parkinson's.  Saturday, February 08, 2023, 9:00 am-2:00 pm.  [place], *In person or online via [place]*.  Register at [email or web address] or call Beverlee Nims at [phone].  [place]:  www.[email or web address]  PD Health at Home continues:  Mindfulness Mondays, Wellness Wednesdays, Fitness Fridays   Upcoming Education:   Parkinson's 101.  Wednesday, February 7th, 1-2 pm The Changing Landscape of Intimacy.  Wednesday, February 14th, 1-2 pm New to Parkinson's:  Care Partner Basics.  Wednesday, February 28th, 1-2 pm Expert Briefing:  Understanding Pain in Parkinson's.   Wednesday, April 10th,  1-2 pm  Register for virtual education and Patent attorney (webinars) at DebtSupply.pl Please check out their website to sign up for emails and  see their full online offerings  [place]:  [email or web address]   Third Thursday Webinars:  On the third Thursday of every month at 12 p.m. ET, join our free live webinars to learn about various aspects of living with Parkinson's disease and our work to speed medical breakthroughs.  Upcoming Webinar:  Therapies for Tomorrow:  How Better Clinical Trial Design Leads to Better Treatments.  Thursday, February 15th at 12 noon. Check out additional information on their website to see their full online offerings    [facility]:  [email or web address]  Upcoming Webinar:   [place].  Thursday, February 8th, 12 noon Webinar Series:  Living with Parkinson's Meetup.   Third Thursdays each month, 3 pm  Care Partner Monthly Meetup.  With Robin Searing [place]ney.  First Tuesday of each month, 2 pm  Check out additional information to Live Well Today on their website    Parkinson and Movement Disorders (PMD) Alliance:  www.pmdalliance.org  [employer] Online:  Online Education Events  Sign up for emails, which are sent weekly to give you updates on programming and online offerings    Parkinson's Association of the [place]:  www.[email or web address]  Information on online support groups, education events, and online exercises including Yoga, Parkinson's exercises and more-LOTS of information on links to PD resources and online events  Virtual Support Group through Parkinson's Association of the [place]; next one is scheduled for Wednesday, Feb 7th  MOVEMENT AND EXERCISE OPPORTUNITIES  PWR! Moves Classes at [place].  Wednesdays 10 and 11 am.   Contact Amy Marriott, PT [email or web address] if interested.  PWR! Moves Class offerings at [employer]. *TUESDAYS* and Wednesdays 1-2 pm.    Contact Vonna Kotyk at  [email or web address]    Parkinson's Wellness Recovery (PWR! Moves)   www.[email or web address]  Info on the PWR! Virtual Experience:  You will have access to our expertise?through self-assessment, guided plans that start with the PD-specific fundamentals, educational content, tips, Q&A with an expert, and a growing Art therapist of PD-specific pre-recorded and live exercise classes of varying types and intensity - both physical and cognitive! If that is not enough, we offer 1:1 wellness consultations (in-person or virtual) to personalize your PWR! Research scientist (medical).   [place] Fridays:   As part of the PD Health @ Home program, this free video series focuses each week on one aspect of fitness designed to support people living with Parkinson's.? These weekly videos highlight the [place] fitness guidelines for people with Parkinson's disease.  ModemGamers.si  Dance for PD website is offering free, live-stream classes throughout the week, as well as links to [employer] of classes:  [email or web address]  Virtual dance and Pilates for Parkinson's classes: Click on the Community Tab> Parkinson's Movement Initiative Tab.  To register for classes and for more information, visit [email or web address] and click the "community" tab.   [employer] Parkinson's Cycling Classes   [facility]:  Thursdays @ Noon-Live classes at [employer] ([employer] at [email or web address]?or [phone])  [employer]: Virtual Classes Mondays and Thursdays Jeanette Caprice classes Tuesday, Wednesday and Thursday (contact [place] at [email or web address] ?or [phone])  [place]  Varied levels of classes are offered Tuesdays and Thursdays at [employer].   Stretching with Verdis Frederickson weekly class is also offered for people with Parkinson's  To observe a class or for more information, call [phone] or email Hezzie Bump at [email or web address]    ADDITIONAL SUPPORT AND RESOURCES  Well[employer]Spring [employer]:Online Caregiver Education Opportunities:  www.well-springsolutions.org/caregiver-education/caregiver-support-group.  You may also contact Leveda Anna  Kolada at [email or web address] or [phone].     Family Caregiver [phone].  Thursday, March 7th, 10:15-1:45 at [facility].  Register with [facility] (see above) Well-Spring Navigator:  Just1Navigator program, a?free service to help individuals and families through the journey of determining care for older adults.  The "Navigator" is a Vickki Muff, Education officer, museum, who will speak with a prospective client and/or loved ones to provide an assessment of the situation and a set of recommendations for a personalized care plan -- all free of charge, and whether?Well[employer]Spring [employer] offers the needed service or not. If the need is not a service we provide, we are well-connected with reputable programs in town that we can refer you to.  www.well-springsolutions.org or to speak with the Navigator, call [phone].

## 2023-02-06 ENCOUNTER — Ambulatory Visit: Payer: PPO | Admitting: Neurology

## 2023-02-06 ENCOUNTER — Other Ambulatory Visit: Payer: Self-pay

## 2023-02-06 DIAGNOSIS — G20A1 Parkinson's disease without dyskinesia, without mention of fluctuations: Secondary | ICD-10-CM

## 2023-02-06 MED ORDER — CARBIDOPA-LEVODOPA 25-100 MG PO TABS: ORAL_TABLET | ORAL | 1 refills | Status: DC

## 2023-03-14 ENCOUNTER — Non-Acute Institutional Stay: Payer: PPO | Admitting: Orthopedic Surgery

## 2023-03-14 ENCOUNTER — Encounter: Payer: Self-pay | Admitting: Orthopedic Surgery

## 2023-03-14 DIAGNOSIS — N1832 Chronic kidney disease, stage 3b: Secondary | ICD-10-CM

## 2023-03-14 DIAGNOSIS — I480 Paroxysmal atrial fibrillation: Secondary | ICD-10-CM

## 2023-03-14 DIAGNOSIS — E785 Hyperlipidemia, unspecified: Secondary | ICD-10-CM

## 2023-03-14 DIAGNOSIS — I2510 Atherosclerotic heart disease of native coronary artery without angina pectoris: Secondary | ICD-10-CM

## 2023-03-14 DIAGNOSIS — G20A1 Parkinson's disease without dyskinesia, without mention of fluctuations: Secondary | ICD-10-CM

## 2023-03-14 DIAGNOSIS — I10 Essential (primary) hypertension: Secondary | ICD-10-CM

## 2023-03-14 DIAGNOSIS — Z86711 Personal history of pulmonary embolism: Secondary | ICD-10-CM

## 2023-03-14 DIAGNOSIS — I479 Paroxysmal tachycardia, unspecified: Secondary | ICD-10-CM

## 2023-03-14 DIAGNOSIS — Z86718 Personal history of other venous thrombosis and embolism: Secondary | ICD-10-CM

## 2023-03-14 DIAGNOSIS — D649 Anemia, unspecified: Secondary | ICD-10-CM

## 2023-03-14 DIAGNOSIS — K219 Gastro-esophageal reflux disease without esophagitis: Secondary | ICD-10-CM

## 2023-03-14 NOTE — Progress Notes (Addendum)
Location:  [facility] Room Number: 25 Place of Service:  ALF [phone]) Provider:  Octavia Heir, NP   Patient Care Team: Mahlon Gammon, MD as PCP - General (Internal Medicine) Tonny Bollman, MD as PCP - Cardiology (Cardiology) Kirby Funk, MD (Internal Medicine) Tat, Octaviano Batty, DO as Consulting Physician (Neurology)  Extended Emergency Contact Information Primary Emergency Contact: Larence Penning Home Phone: [phone] Mobile Phone: [phone] Relation: Daughter  Code Status:  DNR Goals of care: Advanced Directive information    03/14/2023   10:06 AM  Advanced Directives  Does Patient Have a Medical Advance Directive? Yes  Type of Advance Directive Out of facility DNR (pink MOST or yellow form)  Does patient want to make changes to medical advance directive? No - Patient declined  Pre-existing out of facility DNR order (yellow form or pink MOST form) Yellow form placed in chart (order not valid for inpatient use);Pink MOST form placed in chart (order not valid for inpatient use)     Chief Complaint  Patient presents with   Medical Management of Chronic Issues    Routine visit. Discuss need for additional covid boosters or post pone if patient refuses or is not a candidate.     HPI:  Pt is a 89 y.o. female seen today for medical management of chronic diseases.    She currently resides on the assisted living unit at [facility]. PMH: atrial fibrillation, MI, acute PE, DVT, HTN, HLD, GERD, constipation, parkinson's, CKD, right hemiarthroplasty 07/03/2022, and unstable gait.    Tachycardia- followed by cardiology, HR controlled with metoprolol, remains on asa HTN- BUN/creat 33/1.1 01/06/2023, remains on metoprolol HLD- Total 142, LDL 76 (12/2022), AST/ALT 56/2 01/06/2023, remains on atorvastatin  CKD 3b- BUN/creat 33/1.1 01/06/2023 CAD- MI in 2010, s/p stent, remains on asa and statin Parkinson's- followed by neurology, remains on sinemet,  ambulates with [facility]WC H/o PE/DVT- post operative complication from right hemiarthroplasty 07/03/2022, 08/15 CTA noted PE to lower lobe branch of right pulmonary artery, 08/11 diagnosed with acute DVT right femoral vein, Eliquis d/c 02/01/2023 Post op anemia- hgb 11.7 (01/06/2023), ferritin 87, iron 47, TIBC 232 07/23/2022, remains on ferrous sulfate GERD-  remains on Protonix  Recent blood pressures:  04/03- 114/74  03/7- 96/65  03/20- 156/73  Recent weights:  04/02- 119.4 lbs  03/01- 118.8 lbs  02/01- 117.4 lbs     Past Medical History:  Diagnosis Date   Coronary artery disease    Methicillin resistant Staphylococcus aureus in conditions classified elsewhere and of unspecified site    MI (myocardial infarction)    Other and unspecified hyperlipidemia    Unspecified essential hypertension    Past Surgical History:  Procedure Laterality Date   CATARACT EXTRACTION, BILATERAL     CORONARY ANGIOPLASTY WITH STENT PLACEMENT     HIP ARTHROPLASTY Right 07/04/2022   Procedure: RIGHT ANTERIOR HIP REPLACEMENT ;  Surgeon: Kathryne Hitch, MD;  Location: [facility];  Service: Orthopedics;  Laterality: Right;    Allergies  Allergen Reactions   Norvasc [Amlodipine] Anaphylaxis, Swelling and Other (See Comments)    Edema   Altace [Ramipril] Cough   Fosamax [Alendronate] Other (See Comments)    Dysphagia    Outpatient Encounter Medications as of 03/14/2023  Medication Sig   acetaminophen (TYLENOL) 325 MG tablet Take 650 mg by mouth 3 (three) times daily.   aspirin EC 81 MG tablet Take 81 mg by mouth daily. Swallow whole.   atorvastatin (LIPITOR) 10 MG tablet Take  10 mg by mouth at bedtime.   carbidopa-levodopa (SINEMET IR) 25-100 MG tablet Take 1 tablet at 7 AM ,Take 1 tablet at 11 Am ,and Take 1 tablet at 4 pm   docusate sodium (COLACE) 100 MG capsule Take 1 capsule (100 mg total) by mouth 2 (two) times daily.   ferrous sulfate 325 (65 FE) MG tablet Take 325 mg by mouth 3 (three) times  a week. Monday, Wednesday, and Friday.   furosemide (LASIX) 20 MG tablet Take 20 mg by mouth daily as needed for fluid.   melatonin 1 MG TABS tablet Take 2 mg by mouth daily.   methocarbamol (ROBAXIN) 500 MG tablet Take 250 mg by mouth at bedtime as needed for muscle spasms. Avoid Norco use only for moderate pain.   metoprolol tartrate 37.5 MG TABS Take 37.5 mg by mouth 2 (two) times daily.   nitroGLYCERIN (NITROSTAT) 0.4 MG SL tablet Place 1 tablet (0.4 mg total) under the tongue every 5 (five) minutes as needed for chest pain.   pantoprazole (PROTONIX) 20 MG tablet Take 20 mg by mouth daily.   polyethylene glycol (MIRALAX / GLYCOLAX) 17 g packet Take 17 g by mouth daily.   zinc oxide 20 % ointment Apply 1 Application topically as needed for irritation.   No facility-administered encounter medications on file as of 03/14/2023.    Review of Systems  Constitutional:  Negative for activity change and appetite change.  HENT:  Negative for congestion and trouble swallowing.   Eyes:  Negative for visual disturbance.  Respiratory:  Negative for shortness of breath and wheezing.   Cardiovascular:  Negative for chest pain, palpitations and leg swelling.  Gastrointestinal:  Negative for abdominal distention and abdominal pain.  Genitourinary:  Negative for dysuria, hematuria and vaginal bleeding.  Musculoskeletal:  Positive for gait problem.  Skin:  Negative for wound.  Neurological:  Positive for tremors and weakness. Negative for dizziness and headaches.  Psychiatric/Behavioral:  Negative for confusion, dysphoric mood and sleep disturbance. The patient is not nervous/anxious.     Immunization History  Administered Date(s) Administered   Fluad Quad(high Dose 65+) 10/02/2022   Influenza Split 09/24/2010, 09/04/2011, 09/18/2012, 09/08/2013, 09/09/2015, 10/21/2016, 09/22/2019, 09/13/2021   Influenza-Unspecified 11/09/2014, 09/30/2015, 09/17/2016, 09/01/2017, 09/10/2018   Moderna Sars-Covid-2  Vaccination 12/13/2019, 01/09/2021, 05/16/2021   PFIZER(Purple Top)SARS-COV-2 Vaccination 01/10/2020, 10/15/2022   Pneumococcal Conjugate-13 04/18/2014   Pneumococcal Polysaccharide-23 09/20/2002, 09/23/2019   Td 10/11/2004   Tdap 06/14/2016   Zoster Recombinat (Shingrix) 02/10/2019, 05/04/2019   Zoster, Live 01/23/2009   Pertinent  Health Maintenance Due  Topic Date Due   INFLUENZA VACCINE  07/10/2023   DEXA SCAN  Completed      07/23/2022    9:46 AM 07/23/2022   10:00 PM 07/24/2022   10:01 AM 09/06/2022   10:30 AM 10/25/2022   12:09 PM  Fall Risk  Falls in the past year?    1 1  Was there an injury with Fall?    1 1  Fall Risk Category Calculator    3 3  Fall Risk Category (Retired)    [employer]  (RETIRED) Patient Fall Risk Level Moderate fall risk Moderate fall risk Low fall risk High fall risk High fall risk  Patient at Risk for Falls Due to    History of fall(s);Impaired balance/gait;Impaired mobility History of fall(s)  Fall risk Follow up    Falls evaluation completed;Education provided;Falls prevention discussed Falls evaluation completed   Functional Status Survey:    Vitals:   03/14/23  1005  BP: 114/74  Pulse: (!) 58  Resp: 18  Temp: 97.7 F (36.5 C)  SpO2: 98%  Weight: 119 lb 6.4 oz (54.2 kg)  Height: 5\' 6"  (1.676 m)   Body mass index is 19.27 kg/m. Physical Exam Vitals reviewed.  Constitutional:      General: She is not in acute distress. HENT:     Head: Normocephalic.     Right Ear: There is no impacted cerumen.     Left Ear: There is no impacted cerumen.     Nose: Nose normal.     Mouth/Throat:     Mouth: Mucous membranes are moist.  Eyes:     General:        Right eye: No discharge.        Left eye: No discharge.  Cardiovascular:     Rate and Rhythm: Normal rate. Rhythm irregular.     Pulses: Normal pulses.     Heart sounds: Normal heart sounds.  Pulmonary:     Effort: Pulmonary effort is normal. No respiratory distress.     Breath sounds:  Normal breath sounds. No wheezing.  Abdominal:     General: Bowel sounds are normal. There is no distension.     Palpations: Abdomen is soft.     Tenderness: There is no abdominal tenderness.  Musculoskeletal:     Cervical back: Neck supple.     Right lower leg: No edema.     Left lower leg: No edema.  Skin:    General: Skin is warm and dry.     Capillary Refill: Capillary refill takes less than 2 seconds.  Neurological:     General: No focal deficit present.     Mental Status: She is alert and oriented to person, place, and time.     Motor: Weakness present.     Gait: Gait abnormal.     Comments: PWC  Psychiatric:        Mood and Affect: Mood normal.        Behavior: Behavior normal.    Labs reviewed: Recent Labs    07/05/22 0335 07/06/22 0403 07/23/22 1118 07/24/22 0309 01/06/23 0000  NA 142 139 141 138 141  K 3.4* 4.3 4.8 4.1 4.3  CL 110 109 111 110 108  CO2 24 24 21* 20* 26*  GLUCOSE 96 98 88 79  --   BUN 52* 36* 17 18 33*  CREATININE 1.49* 1.29* 1.12* 1.12* 1.1  CALCIUM 7.8* 8.2* 8.5* 8.4* 8.8  MG 1.9 1.8 1.9  --   --    Recent Labs    07/04/22 0340 01/06/23 0000  AST 18 12*  ALT 6 3*  ALKPHOS 74 74  BILITOT 0.7  --   PROT 6.1*  --   ALBUMIN 3.3* 3.6   Recent Labs    07/06/22 0403 07/23/22 1118 07/24/22 0309 01/06/23 0000  WBC 6.2 7.3 4.6 5.6  NEUTROABS 4.6 6.1  --  3,646.00  HGB 9.3* 9.9* 9.5* 11.7*  HCT 28.7* 30.6* 32.0* 35*  MCV 89.7 92.4 100.3*  --   PLT 144* 357 293 215   Lab Results  Component Value Date   TSH 0.434 07/23/2022   No results found for: "HGBA1C" Lab Results  Component Value Date   CHOL 142 01/06/2023   HDL 46 01/06/2023   LDLCALC 76 01/06/2023   TRIG 121 01/06/2023   CHOLHDL 3 09/22/2009    Significant Diagnostic Results in last 30 days:  No results found.  Assessment/Plan  1. Paroxysmal Tachycardia - HR< 100 on metoprolol - completed Zio patch - off Eliquis as of 02/01/2023 - cont asa  2. Essential  hypertension - controlled - cont metoprolol  3. Hyperlipidemia, unspecified hyperlipidemia type - LDL stable - cont atorvastatin  4. Stage 3b chronic kidney disease - avoid nephrotoxic drugs like NSAIDS and dose adjust medications to be renally excreted - encourage hydration with water   5. Atherosclerosis of native coronary artery without angina pectoris, unspecified whether native or transplanted heart - cont asa and statin   6. Parkinson's disease without dyskinesia or fluctuating manifestations - followed by neurology - cont sinemet  7. History of pulmonary embolism - postop complication after right hemiarthroplasty 07/03/2022 - 08/15 CTA noted PE to lower lobe branch of right pulmonary artery - off Eliquis 02/01/2023  8. History of DVT (deep vein thrombosis) - see above - 08/11 diagnosed with acute DVT right femoral vein - off Eliquis  9. Normocytic anemia - hgb stable - cont ferrous sulfate  10. Gastroesophageal reflux disease, unspecified whether esophagitis present - hgb stable - cont Protonix    Family/ staff Communication: plan discussed with patient and nurse  Labs/tests ordered:  none

## 2023-06-26 ENCOUNTER — Non-Acute Institutional Stay: Payer: PPO | Admitting: Internal Medicine

## 2023-06-26 ENCOUNTER — Encounter: Payer: Self-pay | Admitting: Internal Medicine

## 2023-06-26 DIAGNOSIS — G20A1 Parkinson's disease without dyskinesia, without mention of fluctuations: Secondary | ICD-10-CM

## 2023-06-26 DIAGNOSIS — Z86711 Personal history of pulmonary embolism: Secondary | ICD-10-CM | POA: Diagnosis not present

## 2023-06-26 DIAGNOSIS — I10 Essential (primary) hypertension: Secondary | ICD-10-CM

## 2023-06-26 DIAGNOSIS — N1832 Chronic kidney disease, stage 3b: Secondary | ICD-10-CM

## 2023-06-26 DIAGNOSIS — K219 Gastro-esophageal reflux disease without esophagitis: Secondary | ICD-10-CM

## 2023-06-26 DIAGNOSIS — I471 Supraventricular tachycardia, unspecified: Secondary | ICD-10-CM

## 2023-06-26 DIAGNOSIS — F5101 Primary insomnia: Secondary | ICD-10-CM

## 2023-06-26 NOTE — Progress Notes (Unsigned)
Location:  [facility] Room Number: 25A Place of Service:  ALF [phone]) Provider:  Mahlon Gammon, MD   Mahlon Gammon, MD  Patient Care Team: Mahlon Gammon, MD as PCP - General (Internal Medicine) Tonny Bollman, MD as PCP - Cardiology (Cardiology) Kirby Funk, MD (Inactive) (Internal Medicine) Tat, Octaviano Batty, DO as Consulting Physician (Neurology)  Extended Emergency Contact Information Primary Emergency Contact: Larence Penning Home Phone: [phone] Mobile Phone: [phone] Relation: Daughter  Code Status:  DNR Goals of care: Advanced Directive information    06/26/2023   10:24 AM  Advanced Directives  Does Patient Have a Medical Advance Directive? Yes  Type of Advance Directive Out of facility DNR (pink MOST or yellow form)  Does patient want to make changes to medical advance directive? No - Patient declined     Chief Complaint  Patient presents with   Medical Management of Chronic Issues    Patient is being seen for a routine follow up    HPI:  Pt is a 89 y.o. 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   H/o PSVT  On Metoprolol    GERD, HLD,HTN, CKD ,stage 3 a and Parkinson disease Cognitive impairment  Acute issues Pain in her toes Has Thick toes which need cutting Does not seem Neuropathic pain Insomnia Cannot sleep at night No Stress/ No depression   Wt Readings from Last 3 Encounters:  06/26/23 120 lb (54.4 kg)  03/14/23 119 lb 6.4 oz (54.2 kg)  02/05/23 118 lb 11.2 oz (53.8 kg)     Past Medical History:  Diagnosis Date   Coronary artery disease    Methicillin resistant Staphylococcus aureus in conditions classified elsewhere and of unspecified site    MI (myocardial infarction) (HCC)    Other and unspecified hyperlipidemia    Unspecified essential hypertension    Past Surgical  History:  Procedure Laterality Date   CATARACT EXTRACTION, BILATERAL     CORONARY ANGIOPLASTY WITH STENT PLACEMENT     HIP ARTHROPLASTY Right 07/04/2022   Procedure: RIGHT ANTERIOR HIP REPLACEMENT ;  Surgeon: Kathryne Hitch, MD;  Location: [facility];  Service: Orthopedics;  Laterality: Right;    Allergies  Allergen Reactions   Norvasc [Amlodipine] Anaphylaxis, Swelling and Other (See Comments)    Edema   Altace [Ramipril] Cough   Fosamax [Alendronate] Other (See Comments)    Dysphagia    Outpatient Encounter Medications as of 06/26/2023  Medication Sig   acetaminophen (TYLENOL) 325 MG tablet Take 650 mg by mouth 3 (three) times daily.   aspirin EC 81 MG tablet Take 81 mg by mouth daily. Swallow whole.   atorvastatin (LIPITOR) 10 MG tablet Take 10 mg by mouth at bedtime.   carbidopa-levodopa (SINEMET IR) 25-100 MG tablet Take 1 tablet at 7 AM ,Take 1 tablet at 11 Am ,and Take 1 tablet at 4 pm   docusate sodium (COLACE) 100 MG capsule Take 1 capsule (100 mg total) by mouth 2 (two) times daily.   ferrous sulfate 325 (65 FE) MG tablet Take 325 mg by mouth 3 (three) times a week. Monday, Wednesday, and Friday.   furosemide (LASIX) 20 MG tablet Take 20 mg by mouth daily as needed for fluid.   melatonin 1 MG TABS tablet Take 2 mg by mouth daily.   methocarbamol (ROBAXIN) 500 MG  tablet Take 250 mg by mouth at bedtime as needed for muscle spasms. Avoid Norco use only for moderate pain.   metoprolol tartrate 37.5 MG TABS Take 37.5 mg by mouth 2 (two) times daily.   nitroGLYCERIN (NITROSTAT) 0.4 MG SL tablet Place 1 tablet (0.4 mg total) under the tongue every 5 (five) minutes as needed for chest pain.   pantoprazole (PROTONIX) 20 MG tablet Take 20 mg by mouth daily.   polyethylene glycol (MIRALAX / GLYCOLAX) 17 g packet Take 17 g by mouth daily.   zinc oxide 20 % ointment Apply 1 Application topically as needed for irritation.   No facility-administered encounter medications on file as of  06/26/2023.    Review of Systems  Constitutional:  Negative for activity change and appetite change.  HENT: Negative.    Respiratory:  Negative for cough and shortness of breath.   Cardiovascular:  Negative for leg swelling.  Gastrointestinal:  Negative for constipation.  Genitourinary: Negative.   Musculoskeletal:  Positive for gait problem. Negative for arthralgias and myalgias.  Skin: Negative.   Neurological:  Negative for dizziness and weakness.  Psychiatric/Behavioral:  Positive for sleep disturbance. Negative for confusion and dysphoric mood.     Immunization History  Administered Date(s) Administered   Fluad Quad(high Dose 65+) 10/02/2022   Influenza Split 09/24/2010, 09/04/2011, 09/18/2012, 09/08/2013, 09/09/2015, 10/21/2016, 09/22/2019, 09/13/2021   Influenza-Unspecified 11/09/2014, 09/30/2015, 09/17/2016, 09/01/2017, 09/10/2018   Moderna Sars-Covid-2 Vaccination 12/13/2019, 01/09/2021, 05/16/2021   PFIZER(Purple Top)SARS-COV-2 Vaccination 01/10/2020, 10/15/2022   Pneumococcal Conjugate-13 04/18/2014   Pneumococcal Polysaccharide-23 09/20/2002, 09/23/2019   Td 10/11/2004   Tdap 06/14/2016   Zoster Recombinant(Shingrix) 02/10/2019, 05/04/2019   Zoster, Live 01/23/2009   Pertinent  Health Maintenance Due  Topic Date Due   INFLUENZA VACCINE  07/10/2023   DEXA SCAN  Completed      07/23/2022    9:46 AM 07/23/2022   10:00 PM 07/24/2022   10:01 AM 09/06/2022   10:30 AM 10/25/2022   12:09 PM  Fall Risk  Falls in the past year?    1 1  Was there an injury with Fall?    1 1  Fall Risk Category Calculator    3 3  Fall Risk Category (Retired)    [employer]  (RETIRED) Patient Fall Risk Level Moderate fall risk Moderate fall risk Low fall risk High fall risk High fall risk  Patient at Risk for Falls Due to    History of fall(s);Impaired balance/gait;Impaired mobility History of fall(s)  Fall risk Follow up    Falls evaluation completed;Education provided;Falls prevention  discussed Falls evaluation completed   Functional Status Survey:    Vitals:   06/26/23 1014  BP: (!) 141/61  Pulse: (!) 55  Resp: 20  Temp: 98.3 F (36.8 C)  TempSrc: Temporal  SpO2: 95%  Weight: 120 lb (54.4 kg)  Height: 5\' 6"  (1.676 m)   Body mass index is 19.37 kg/m. Physical Exam Vitals reviewed.  Constitutional:      Appearance: Normal appearance.  HENT:     Head: Normocephalic.     Nose: Nose normal.     Mouth/Throat:     Mouth: Mucous membranes are moist.     Pharynx: Oropharynx is clear.  Eyes:     Pupils: Pupils are equal, round, and reactive to light.  Cardiovascular:     Rate and Rhythm: Normal rate and regular rhythm.     Pulses: Normal pulses.     Heart sounds: Normal heart sounds. No murmur heard. Pulmonary:  Effort: Pulmonary effort is normal.     Breath sounds: Normal breath sounds.  Abdominal:     General: Abdomen is flat. Bowel sounds are normal.     Palpations: Abdomen is soft.  Musculoskeletal:        General: No swelling.     Cervical back: Neck supple.     Comments: Does have Big thick toe nails Good DP pulses Very minimal swelling  Skin:    General: Skin is warm.  Neurological:     General: No focal deficit present.     Mental Status: She is alert and oriented to person, place, and time.  Psychiatric:        Mood and Affect: Mood normal.        Thought Content: Thought content normal.     Labs reviewed: Recent Labs    07/05/22 0335 07/06/22 0403 07/23/22 1118 07/24/22 0309 01/06/23 0000  NA 142 139 141 138 141  K 3.4* 4.3 4.8 4.1 4.3  CL 110 109 111 110 108  CO2 24 24 21* 20* 26*  GLUCOSE 96 98 88 79  --   BUN 52* 36* 17 18 33*  CREATININE 1.49* 1.29* 1.12* 1.12* 1.1  CALCIUM 7.8* 8.2* 8.5* 8.4* 8.8  MG 1.9 1.8 1.9  --   --    Recent Labs    07/04/22 0340 01/06/23 0000  AST 18 12*  ALT 6 3*  ALKPHOS 74 74  BILITOT 0.7  --   PROT 6.1*  --   ALBUMIN 3.3* 3.6   Recent Labs    07/06/22 0403 07/23/22 1118  07/24/22 0309 01/06/23 0000  WBC 6.2 7.3 4.6 5.6  NEUTROABS 4.6 6.1  --  3,646.00  HGB 9.3* 9.9* 9.5* 11.7*  HCT 28.7* 30.6* 32.0* 35*  MCV 89.7 92.4 100.3*  --   PLT 144* 357 293 215   Lab Results  Component Value Date   TSH 0.434 07/23/2022   No results found for: "HGBA1C" Lab Results  Component Value Date   CHOL 142 01/06/2023   HDL 46 01/06/2023   LDLCALC 76 01/06/2023   TRIG 121 01/06/2023   CHOLHDL 3 09/22/2009    Significant Diagnostic Results in last 30 days:  No results found.  Assessment/Plan 1. Parkinson's disease without dyskinesia or fluctuating manifestations On Sinemet Follows with Dr Tat  2. Essential hypertension Doing well on Metoprolol  3. Stage 3b chronic kidney disease (HCC) Creat stable  4. History of pulmonary embolism post op Now off eliquis  5. Gastroesophageal reflux disease, unspecified whether esophagitis present On Protonix  6. SVT (supraventricular tachycardia) On Aspirin and Metoprolol  7. Primary insomnia She will try Remeron 7.5 mg 8 HLD On Statin LDL 76 in 01/24  9 Anemia On Iron 10 Thick Toe nails Will go to Podiatry here if nurses cannot cut  Family/ staff Communication:  Labs/tests ordered:

## 2023-06-30 ENCOUNTER — Ambulatory Visit: Payer: PPO | Attending: Cardiovascular Disease | Admitting: Cardiovascular Disease

## 2023-06-30 ENCOUNTER — Encounter: Payer: Self-pay | Admitting: Cardiovascular Disease

## 2023-06-30 NOTE — Progress Notes (Deleted)
Cardiology Office Note:    Date:  06/30/2023   ID:  Taylor Mendoza, DOB May 14, 1930, MRN [id number]  PCP:  Mahlon Gammon, MD   [facility] Providers Cardiologist:  Tonny Bollman, MD     Referring MD: Mahlon Gammon, MD   No chief complaint on file.   History of Present Illness:    Taylor Mendoza is a 89 y.o. female with a hx of ***  Past Medical History:  Diagnosis Date   Coronary artery disease    Methicillin resistant Staphylococcus aureus in conditions classified elsewhere and of unspecified site    MI (myocardial infarction) (HCC)    Other and unspecified hyperlipidemia    Unspecified essential hypertension     Past Surgical History:  Procedure Laterality Date   CATARACT EXTRACTION, BILATERAL     CORONARY ANGIOPLASTY WITH STENT PLACEMENT     HIP ARTHROPLASTY Right 07/04/2022   Procedure: RIGHT ANTERIOR HIP REPLACEMENT ;  Surgeon: Kathryne Hitch, MD;  Location: [facility];  Service: Orthopedics;  Laterality: Right;    Current Medications: No outpatient medications have been marked as taking for the 06/30/23 encounter (Appointment) with Tonny Bollman, MD.     Allergies:   Norvasc [amlodipine], Altace [ramipril], and Fosamax [alendronate]   Social History   Socioeconomic History   Marital status: Married    Spouse name: Not on file   Number of children: Not on file   Years of education: Not on file   Highest education level: Not on file  Occupational History   Not on file  Tobacco Use   Smoking status: Never   Smokeless tobacco: Never  Vaping Use   Vaping status: Never Used  Substance and Sexual Activity   Alcohol use: No   Drug use: No   Sexual activity: Not on file  Other Topics Concern   Not on file  Social History Narrative   Right handed    [facility]   Social Determinants of Health   Financial Resource Strain: Low Risk  (09/06/2022)   Overall Financial Resource Strain (CARDIA)    Difficulty of Paying Living Expenses:  Not hard at all  Food Insecurity: No Food Insecurity (09/06/2022)   Hunger Vital Sign    Worried About Running Out of Food in the Last Year: Never true    Ran Out of Food in the Last Year: Never true  Transportation Needs: No Transportation Needs (09/06/2022)   PRAPARE - Administrator, Civil Service (Medical): No    Lack of Transportation (Non-Medical): No  Physical Activity: Insufficiently Active (09/06/2022)   Exercise Vital Sign    Days of Exercise per Week: 7 days    Minutes of Exercise per Session: 10 min  Stress: No Stress Concern Present (09/06/2022)   [employer] of Occupational Health - Occupational Stress Questionnaire    Feeling of Stress : Not at all  Social Connections: Moderately Isolated (09/06/2022)   Social Connection and Isolation Panel [NHANES]    Frequency of Communication with Friends and Family: Three times a week    Frequency of Social Gatherings with Friends and Family: Twice a week    Attends Religious Services: Never    Database administrator or Organizations: No    Attends Engineer, structural: Never    Marital Status: Married     Family History: The patient's ***family history includes Osteoporosis in her mother; Skin cancer in her mother; Stroke in her brother and father.  ROS:  Please see the history of present illness.    *** All other systems reviewed and are negative.  EKGs/Labs/Other Studies Reviewed:    The following studies were reviewed today: ***      Recent Labs: 07/23/2022: B Natriuretic Peptide 486.2; Magnesium 1.9; TSH 0.434 01/06/2023: ALT 3; BUN 33; Creatinine 1.1; Hemoglobin 11.7; Platelets 215; Potassium 4.3; Sodium 141  Recent Lipid Panel    Component Value Date/Time   CHOL 142 01/06/2023 0000   TRIG 121 01/06/2023 0000   HDL 46 01/06/2023 0000   CHOLHDL 3 09/22/2009 1016   VLDL 19.4 09/22/2009 1016   LDLCALC 76 01/06/2023 0000     Risk Assessment/Calculations:   {Does this patient have ATRIAL  FIBRILLATION?:[phone]}  No BP recorded.  {Refresh Note OR Click here to enter BP  :1}***         Physical Exam:    VS:  There were no vitals taken for this visit.    Wt Readings from Last 3 Encounters:  06/26/23 120 lb (54.4 kg)  03/14/23 119 lb 6.4 oz (54.2 kg)  02/05/23 118 lb 11.2 oz (53.8 kg)     GEN: *** Well nourished, well developed in no acute distress HEENT: Normal NECK: No JVD; No carotid bruits LYMPHATICS: No lymphadenopathy CARDIAC: ***RRR, no murmurs, rubs, gallops RESPIRATORY:  Clear to auscultation without rales, wheezing or rhonchi  ABDOMEN: Soft, non-tender, non-distended MUSCULOSKELETAL:  No edema; No deformity  SKIN: Warm and dry NEUROLOGIC:  Alert and oriented x 3 PSYCHIATRIC:  Normal affect   ASSESSMENT:    1. PAT (paroxysmal atrial tachycardia)   2. Coronary artery disease involving native coronary artery of native heart without angina pectoris    PLAN:    In order of problems listed above:  ***      {Are you ordering a CV Procedure (e.g. stress test, cath, DCCV, TEE, etc)?   Press F2        :[id number]}    Medication Adjustments/Labs and Tests Ordered: Current medicines are reviewed at length with the patient today.  Concerns regarding medicines are outlined above.  No orders of the defined types were placed in this encounter.  No orders of the defined types were placed in this encounter.   There are no Patient Instructions on file for this visit.   Signed, Tonny Bollman, MD  06/30/2023 1:55 PM    [facility]

## 2023-07-17 ENCOUNTER — Non-Acute Institutional Stay: Payer: PPO | Admitting: Internal Medicine

## 2023-07-17 ENCOUNTER — Encounter: Payer: Self-pay | Admitting: Internal Medicine

## 2023-07-17 DIAGNOSIS — R001 Bradycardia, unspecified: Secondary | ICD-10-CM

## 2023-07-17 DIAGNOSIS — I491 Atrial premature depolarization: Secondary | ICD-10-CM

## 2023-07-17 DIAGNOSIS — F5101 Primary insomnia: Secondary | ICD-10-CM

## 2023-07-17 NOTE — Progress Notes (Signed)
[facility] of Service:  ALF (13)  Provider:   Code Status: DNR Goals of Care:     06/26/2023   10:24 AM  Advanced Directives  Does Patient Have a Medical Advance Directive? Yes  Type of Advance Directive Out of facility DNR (pink MOST or yellow form)  Does patient want to make changes to medical advance directive? No - Patient declined     Chief Complaint  Patient presents with   Care Management   Acute Visit    HPI: Patient is a 89 y.o. 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   H/o PSVT  On Metoprolol    GERD, HLD,HTN, CKD ,stage 3 a and Parkinson disease Cognitive impairment   Her HR is running in 40s recently and she has been feeling tired No Other issues Sleeping better on Remeron  Past Medical History:  Diagnosis Date   Coronary artery disease    Methicillin resistant Staphylococcus aureus in conditions classified elsewhere and of unspecified site    MI (myocardial infarction) (HCC)    Other and unspecified hyperlipidemia    Unspecified essential hypertension     Past Surgical History:  Procedure Laterality Date   CATARACT EXTRACTION, BILATERAL     CORONARY ANGIOPLASTY WITH STENT PLACEMENT     HIP ARTHROPLASTY Right 07/04/2022   Procedure: RIGHT ANTERIOR HIP REPLACEMENT ;  Surgeon: Kathryne Hitch, MD;  Location: [facility];  Service: Orthopedics;  Laterality: Right;    Allergies  Allergen Reactions   Norvasc [Amlodipine] Anaphylaxis, Swelling and Other (See Comments)    Edema   Altace [Ramipril] Cough   Fosamax [Alendronate] Other (See Comments)    Dysphagia    Outpatient Encounter Medications as of 07/17/2023  Medication Sig   acetaminophen (TYLENOL) 325 MG tablet Take 650 mg by mouth 3 (three) times daily.   aspirin EC 81 MG tablet Take 81 mg by  mouth daily. Swallow whole.   atorvastatin (LIPITOR) 10 MG tablet Take 10 mg by mouth at bedtime.   carbidopa-levodopa (SINEMET IR) 25-100 MG tablet Take 1 tablet at 7 AM ,Take 1 tablet at 11 Am ,and Take 1 tablet at 4 pm   docusate sodium (COLACE) 100 MG capsule Take 1 capsule (100 mg total) by mouth 2 (two) times daily.   ferrous sulfate 325 (65 FE) MG tablet Take 325 mg by mouth 3 (three) times a week. Monday, Wednesday, and Friday.   furosemide (LASIX) 20 MG tablet Take 20 mg by mouth daily as needed for fluid.   methocarbamol (ROBAXIN) 500 MG tablet Take 250 mg by mouth at bedtime as needed for muscle spasms. Avoid Norco use only for moderate pain.   metoprolol tartrate 37.5 MG TABS Take 37.5 mg by mouth 2 (two) times daily.   mirtazapine (REMERON) 7.5 MG tablet Take 7.5 mg by mouth at bedtime.   nitroGLYCERIN (NITROSTAT) 0.4 MG SL tablet Place 1 tablet (0.4 mg total) under the tongue every 5 (five) minutes as needed for chest pain.   pantoprazole (PROTONIX) 20 MG tablet Take 20 mg by mouth daily.   polyethylene glycol (MIRALAX / GLYCOLAX) 17 g packet Take 17 g by mouth daily.   zinc oxide 20 % ointment Apply 1 Application topically as needed for irritation.   No facility-administered encounter  medications on file as of 07/17/2023.    Review of Systems:  Review of Systems  Constitutional:  Negative for activity change and appetite change.  HENT: Negative.    Respiratory:  Negative for cough and shortness of breath.   Cardiovascular:  Negative for leg swelling.  Gastrointestinal:  Negative for constipation.  Genitourinary: Negative.   Musculoskeletal:  Negative for arthralgias, gait problem and myalgias.  Skin: Negative.   Neurological:  Negative for dizziness and weakness.  Psychiatric/Behavioral:  Negative for confusion, dysphoric mood and sleep disturbance.     Health Maintenance  Topic Date Due   COVID-19 Vaccine (6 - 2023-24 season) 12/10/2022   INFLUENZA VACCINE  07/10/2023    Medicare Annual Wellness (AWV)  09/07/2023   DTaP/Tdap/Td (3 - Td or Tdap) 06/14/2026   Pneumonia Vaccine 32+ Years old  Completed   DEXA SCAN  Completed   Zoster Vaccines- Shingrix  Completed   HPV VACCINES  Aged Out    Physical Exam: Vitals:   07/17/23 1448  BP: 118/67  Pulse: (!) 47  Resp: 18  Temp: (!) 97.5 F (36.4 C)  Weight: 120 lb 6.4 oz (54.6 kg)   Body mass index is 19.43 kg/m. Physical Exam Vitals reviewed.  Constitutional:      Appearance: Normal appearance.  HENT:     Head: Normocephalic.     Nose: Nose normal.     Mouth/Throat:     Mouth: Mucous membranes are moist.     Pharynx: Oropharynx is clear.  Eyes:     Pupils: Pupils are equal, round, and reactive to light.  Cardiovascular:     Rate and Rhythm: Regular rhythm. Bradycardia present.     Pulses: Normal pulses.     Heart sounds: Normal heart sounds. No murmur heard. Pulmonary:     Effort: Pulmonary effort is normal.     Breath sounds: Normal breath sounds.  Abdominal:     General: Abdomen is flat. Bowel sounds are normal.     Palpations: Abdomen is soft.  Musculoskeletal:        General: No swelling.     Cervical back: Neck supple.  Skin:    General: Skin is warm.  Neurological:     General: No focal deficit present.     Mental Status: She is alert and oriented to person, place, and time.  Psychiatric:        Mood and Affect: Mood normal.        Thought Content: Thought content normal.     Labs reviewed: Basic Metabolic Panel: Recent Labs    07/23/22 1118 07/24/22 0309 01/06/23 0000  NA 141 138 141  K 4.8 4.1 4.3  CL 111 110 108  CO2 21* 20* 26*  GLUCOSE 88 79  --   BUN 17 18 33*  CREATININE 1.12* 1.12* 1.1  CALCIUM 8.5* 8.4* 8.8  MG 1.9  --   --   TSH 0.434  --   --    Liver Function Tests: Recent Labs    01/06/23 0000  AST 12*  ALT 3*  ALKPHOS 74  ALBUMIN 3.6   No results for input(s): "LIPASE", "AMYLASE" in the last 8760 hours. No results for input(s):  "AMMONIA" in the last 8760 hours. CBC: Recent Labs    07/23/22 1118 07/24/22 0309 01/06/23 0000  WBC 7.3 4.6 5.6  NEUTROABS 6.1  --  3,646.00  HGB 9.9* 9.5* 11.7*  HCT 30.6* 32.0* 35*  MCV 92.4 100.3*  --   PLT 357  293 215   Lipid Panel: Recent Labs    01/06/23 0000  CHOL 142  HDL 46  LDLCALC 76  TRIG 121   No results found for: "HGBA1C"  Procedures since last visit: No results found.  Assessment/Plan 1. Bradycardia Change Metoprolol to 25 mg BID CBC,CMP,Lipid and TSH  2. PAC (premature atrial contraction) Continue low dose of Metoprolol  3. Primary insomnia Doing well on Remeron  Other issues Parkinson's disease without dyskinesia or fluctuating manifestations On Sinemet Follows with Dr Tat      Stage 3b chronic kidney disease (HCC) Creat stable    History of pulmonary embolism post op Now off eliquis Gastroesophageal reflux disease, unspecified whether esophagitis present On Protonix    SVT (supraventricular tachycardia) On Aspirin and Metoprolol    HLD On Statin LDL 76 in 01/24   Anemia Thick Toe nails  Labs/tests ordered:   Next appt:  Visit date not found

## 2023-08-06 NOTE — Progress Notes (Deleted)
Assessment/Plan:   1.  Parkinsons Disease             -Continue carbidopa/levodopa 25/100, 1 tablet 3 times per day.               -She had some increased falls immediatly post hip fracture and during a time where she was positive for COVID.  this was not a primary Parkinson's issue and she is back to baseline and doing well.  Encouraged her to exercise with stationary bike.   2.  DVT and subsequent PE, post hip fracture             -Eliquis has now been discontinued  3.  Fatigue  -While fatigue is the #1 treatment resistant complaint of Parkinsons patients, her fatigue is likely due to bradycardia with heart rate in the 40s.  Her primary care physician is trying to change around her metoprolol.  She is on metoprolol, 25 mg twice per day.   Subjective:   Taylor Mendoza was seen today in follow up for Parkinsons disease.  My previous records were reviewed prior to todays visit as well as outside records available to me.  Pt with daughters who supplements the history.   Patient was last seen by Dr. Chales Abrahams on August 8.  Noted that heart rate was running in the 40s and patient has been tired.  Her metoprolol was changed to 25 mg twice per day (from 37.5 mg twice per day).  Current prescribed movement disorder medications: Carbidopa/levodopa 25/100, 1 tablet 3 times per day (she isn't sure when she is getting these meds)    ALLERGIES:   Allergies  Allergen Reactions   Norvasc [Amlodipine] Anaphylaxis, Swelling and Other (See Comments)    Edema   Altace [Ramipril] Cough   Fosamax [Alendronate] Other (See Comments)    Dysphagia    CURRENT MEDICATIONS:  Outpatient Encounter Medications as of 08/07/2023  Medication Sig   acetaminophen (TYLENOL) 325 MG tablet Take 650 mg by mouth 3 (three) times daily.   aspirin EC 81 MG tablet Take 81 mg by mouth daily. Swallow whole.   atorvastatin (LIPITOR) 10 MG tablet Take 10 mg by mouth at bedtime.   carbidopa-levodopa (SINEMET IR) 25-100 MG  tablet Take 1 tablet at 7 AM ,Take 1 tablet at 11 Am ,and Take 1 tablet at 4 pm   docusate sodium (COLACE) 100 MG capsule Take 1 capsule (100 mg total) by mouth 2 (two) times daily.   ferrous sulfate 325 (65 FE) MG tablet Take 325 mg by mouth 3 (three) times a week. Monday, Wednesday, and Friday.   furosemide (LASIX) 20 MG tablet Take 20 mg by mouth daily as needed for fluid.   methocarbamol (ROBAXIN) 500 MG tablet Take 250 mg by mouth at bedtime as needed for muscle spasms. Avoid Norco use only for moderate pain.   metoprolol tartrate 37.5 MG TABS Take 37.5 mg by mouth 2 (two) times daily. (Patient taking differently: Take 25 mg by mouth 2 (two) times daily.)   mirtazapine (REMERON) 7.5 MG tablet Take 7.5 mg by mouth at bedtime.   nitroGLYCERIN (NITROSTAT) 0.4 MG SL tablet Place 1 tablet (0.4 mg total) under the tongue every 5 (five) minutes as needed for chest pain.   pantoprazole (PROTONIX) 20 MG tablet Take 20 mg by mouth daily.   polyethylene glycol (MIRALAX / GLYCOLAX) 17 g packet Take 17 g by mouth daily.   zinc oxide 20 % ointment Apply 1 Application topically as  needed for irritation.   No facility-administered encounter medications on file as of 08/07/2023.    Objective:   PHYSICAL EXAMINATION:    VITALS:   There were no vitals filed for this visit.   Wt Readings from Last 3 Encounters:  07/17/23 120 lb 6.4 oz (54.6 kg)  06/26/23 120 lb (54.4 kg)  03/14/23 119 lb 6.4 oz (54.2 kg)   GEN:  The patient appears stated age and is in NAD. HEENT:  Normocephalic, atraumatic.  The mucous membranes are moist. The superficial temporal arteries are without ropiness or tenderness. CV:  RRR Lungs:  CTAB Neck/HEME:  There are no carotid bruits bilaterally.  Neurological examination:  Orientation: The patient is alert and oriented x3. Cranial nerves: There is good facial symmetry with min facial hypomimia. The speech is fluent and clear. Soft palate rises symmetrically and there is no  tongue deviation. Hearing is intact to conversational tone. Sensation: Sensation is intact to light touch throughout Motor: Strength is at least antigravity x4.  Movement examination: Tone: There is normal tone today Abnormal movements: none Coordination:  There is no decremation today Gait and Station: Patient pushes off of the chair to arise.  The patient's stride length is slightly decreased but she is wide based.  She has a valgus knee deformity  Total time spent on today's visit was *** minutes, including both face-to-face time and nonface-to-face time.  Time included that spent on review of records (prior notes available to me/labs/imaging if pertinent), discussing treatment and goals, answering patient's questions and coordinating care.   Cc:  Mahlon Gammon, MD

## 2023-08-07 ENCOUNTER — Ambulatory Visit: Payer: PPO | Admitting: Neurology

## 2023-09-12 ENCOUNTER — Non-Acute Institutional Stay (INDEPENDENT_AMBULATORY_CARE_PROVIDER_SITE_OTHER): Payer: Self-pay | Admitting: Orthopedic Surgery

## 2023-09-12 ENCOUNTER — Encounter: Payer: Self-pay | Admitting: Orthopedic Surgery

## 2023-09-12 DIAGNOSIS — Z0000 Encounter for general adult medical examination without abnormal findings: Secondary | ICD-10-CM

## 2023-09-12 NOTE — Progress Notes (Signed)
Subjective:   Taylor Mendoza is a 89 y.o. female who presents for Medicare Annual (Subsequent) preventive examination.  Visit Complete: In person  Patient Medicare AWV questionnaire was completed by the patient on 09/12/2023; I have confirmed that all information answered by patient is correct and no changes since this date.  Cardiac Risk Factors include: advanced age (>9men, >52 women);sedentary lifestyle;hypertension;dyslipidemia     Objective:    Today's Vitals   09/12/23 1021  BP: 118/62  Pulse: (!) 56  Resp: 18  Temp: (!) 97.5 F (36.4 C)  SpO2: 95%  Weight: 121 lb 6.4 oz (55.1 kg)  Height: 5\' 6"  (1.676 m)   Body mass index is 19.59 kg/m.     06/26/2023   10:24 AM 03/14/2023   10:06 AM 01/02/2023    2:16 PM 10/25/2022   12:15 PM 08/15/2022   11:01 AM 08/09/2022   12:31 PM 08/08/2022    3:43 PM  Advanced Directives  Does Patient Have a Medical Advance Directive? Yes Yes Yes Yes Yes Yes No  Type of Advance Directive Out of facility DNR (pink MOST or yellow form) Out of facility DNR (pink MOST or yellow form) Out of facility DNR (pink MOST or yellow form) Out of facility DNR (pink MOST or yellow form) Out of facility DNR (pink MOST or yellow form) Out of facility DNR (pink MOST or yellow form)   Does patient want to make changes to medical advance directive? No - Patient declined No - Patient declined No - Patient declined No - Patient declined No - Patient declined No - Patient declined   Would patient like information on creating a medical advance directive?       No - Guardian declined  Pre-existing out of facility DNR order (yellow form or pink MOST form)  Yellow form placed in chart (order not valid for inpatient use);Pink MOST form placed in chart (order not valid for inpatient use)  Yellow form placed in chart (order not valid for inpatient use)       Current Medications (verified) Outpatient Encounter Medications as of 09/12/2023  Medication Sig   acetaminophen  (TYLENOL) 325 MG tablet Take 650 mg by mouth 3 (three) times daily.   aspirin EC 81 MG tablet Take 81 mg by mouth daily. Swallow whole.   atorvastatin (LIPITOR) 10 MG tablet Take 10 mg by mouth at bedtime.   carbidopa-levodopa (SINEMET IR) 25-100 MG tablet Take 1 tablet at 7 AM ,Take 1 tablet at 11 Am ,and Take 1 tablet at 4 pm   docusate sodium (COLACE) 100 MG capsule Take 1 capsule (100 mg total) by mouth 2 (two) times daily.   ferrous sulfate 325 (65 FE) MG tablet Take 325 mg by mouth 3 (three) times a week. Monday, Wednesday, and Friday.   furosemide (LASIX) 20 MG tablet Take 20 mg by mouth daily as needed for fluid.   methocarbamol (ROBAXIN) 500 MG tablet Take 250 mg by mouth at bedtime as needed for muscle spasms. Avoid Norco use only for moderate pain.   metoprolol tartrate 37.5 MG TABS Take 37.5 mg by mouth 2 (two) times daily. (Patient taking differently: Take 25 mg by mouth 2 (two) times daily.)   mirtazapine (REMERON) 7.5 MG tablet Take 7.5 mg by mouth at bedtime.   nitroGLYCERIN (NITROSTAT) 0.4 MG SL tablet Place 1 tablet (0.4 mg total) under the tongue every 5 (five) minutes as needed for chest pain.   pantoprazole (PROTONIX) 20 MG tablet Take 20 mg  by mouth daily.   polyethylene glycol (MIRALAX / GLYCOLAX) 17 g packet Take 17 g by mouth daily.   zinc oxide 20 % ointment Apply 1 Application topically as needed for irritation.   No facility-administered encounter medications on file as of 09/12/2023.    Allergies (verified) Norvasc [amlodipine], Altace [ramipril], and Fosamax [alendronate]   History: Past Medical History:  Diagnosis Date   Coronary artery disease    Methicillin resistant Staphylococcus aureus in conditions classified elsewhere and of unspecified site    MI (myocardial infarction) (HCC)    Other and unspecified hyperlipidemia    Unspecified essential hypertension    Past Surgical History:  Procedure Laterality Date   CATARACT EXTRACTION, BILATERAL      CORONARY ANGIOPLASTY WITH STENT PLACEMENT     HIP ARTHROPLASTY Right 07/04/2022   Procedure: RIGHT ANTERIOR HIP REPLACEMENT ;  Surgeon: Kathryne Hitch, MD;  Location: [facility];  Service: Orthopedics;  Laterality: Right;   Family History  Problem Relation Age of Onset   Osteoporosis Mother    Skin cancer Mother    Stroke Father    Stroke Brother    Social History   Socioeconomic History   Marital status: Married    Spouse name: Not on file   Number of children: Not on file   Years of education: Not on file   Highest education level: Not on file  Occupational History   Not on file  Tobacco Use   Smoking status: Never   Smokeless tobacco: Never  Vaping Use   Vaping status: Never Used  Substance and Sexual Activity   Alcohol use: No   Drug use: No   Sexual activity: Not on file  Other Topics Concern   Not on file  Social History Narrative   Right handed    [facility]   Social Determinants of Health   Financial Resource Strain: Low Risk  (09/12/2023)   Overall Financial Resource Strain (CARDIA)    Difficulty of Paying Living Expenses: Not hard at all  Food Insecurity: No Food Insecurity (09/12/2023)   Hunger Vital Sign    Worried About Running Out of Food in the Last Year: Never true    Ran Out of Food in the Last Year: Never true  Transportation Needs: No Transportation Needs (09/12/2023)   PRAPARE - Administrator, Civil Service (Medical): No    Lack of Transportation (Non-Medical): No  Physical Activity: Insufficiently Active (09/12/2023)   Exercise Vital Sign    Days of Exercise per Week: 7 days    Minutes of Exercise per Session: 10 min  Stress: No Stress Concern Present (09/12/2023)   [employer] of Occupational Health - Occupational Stress Questionnaire    Feeling of Stress : Not at all  Social Connections: Moderately Isolated (09/12/2023)   Social Connection and Isolation Panel [NHANES]    Frequency of Communication with Friends and  Family: More than three times a week    Frequency of Social Gatherings with Friends and Family: Twice a week    Attends Religious Services: Never    Database administrator or Organizations: No    Attends Engineer, structural: Never    Marital Status: Married    Tobacco Counseling Counseling given: Not Answered   Clinical Intake:  Pre-visit preparation completed: Yes  Pain : No/denies pain     BMI - recorded: 19.59 Nutritional Status: BMI of 19-24  Normal Nutritional Risks: None Diabetes: No  How often do you need  to have someone help you when you read instructions, pamphlets, or other written materials from your doctor or pharmacy?: 1 - Never What is the last grade level you completed in school?: college  Interpreter Needed?: No      Activities of Daily Living    09/12/2023   10:24 AM  In your present state of health, do you have any difficulty performing the following activities:  Hearing? 0  Vision? 0  Difficulty concentrating or making decisions? 0  Walking or climbing stairs? 1  Dressing or bathing? 0  Doing errands, shopping? 1  Preparing Food and eating ? N  Using the Toilet? N  In the past six months, have you accidently leaked urine? N  Do you have problems with loss of bowel control? N  Managing your Medications? N  Managing your Finances? Y  Comment children assist  Housekeeping or managing your Housekeeping? Y    Patient Care Team: Mahlon Gammon, MD as PCP - General (Internal Medicine) Tonny Bollman, MD as PCP - Cardiology (Cardiology) Kirby Funk, MD (Inactive) (Internal Medicine) Tat, Octaviano Batty, DO as Consulting Physician (Neurology)  Indicate any recent Medical Services you may have received from other than [facility] providers in the past year (date may be approximate).     Assessment:   This is a routine wellness examination for Taylor Mendoza.  Hearing/Vision screen No results found.   Goals Addressed             This Visit's  Progress    Maintain Mobility and Function   On track    Evidence-based guidance:  Acknowledge and validate impact of pain, loss of strength and potential disfigurement (hand osteoarthritis) on mental health and daily life, such as social isolation, anxiety, depression, impaired sexual relationship and   injury from falls.  Anticipate referral to physical or occupational therapy for assessment, therapeutic exercise and recommendation for adaptive equipment or assistive devices; encourage participation.  Assess impact on ability to perform activities of daily living, as well as engage in sports and leisure events or requirements of work or school.  Provide anticipatory guidance and reassurance about the benefit of exercise to maintain function; acknowledge and normalize fear that exercise may worsen symptoms.  Encourage regular exercise, at least 10 minutes at a time for 45 minutes per week; consider yoga, water exercise and proprioceptive exercises; encourage use of wearable activity tracker to increase motivation and adherence.  Encourage maintenance or resumption of daily activities, including employment, as pain allows and with minimal exposure to trauma.  Assist patient to advocate for adaptations to the work environment.  Consider level of pain and function, gender, age, lifestyle, patient preference, quality of life, readiness and ?ocapacity to benefit? when recommending patients for orthopaedic surgery consultation.  Explore strategies, such as changes to medication regimen or activity that enables patient to anticipate and manage flare-ups that increase deconditioning and disability.  Explore patient preferences; encourage exposure to a broader range of activities that have been avoided for fear of experiencing pain.  Identify barriers to participation in therapy or exercise, such as pain with activity, anticipated or imagined pain.  Monitor postoperative joint replacement or any  preexisting joint replacement for ongoing pain and loss of function; provide social support and encouragement throughout recovery.   Notes:        Depression Screen    09/12/2023   10:26 AM 10/25/2022   12:09 PM 09/06/2022   10:29 AM  PHQ 2/9 Scores  PHQ - 2 Score 0 0  0    Fall Risk    09/12/2023   10:27 AM 10/25/2022   12:09 PM 09/06/2022   10:30 AM 02/27/2022    9:16 AM 04/24/2021    1:04 PM  Fall Risk   Falls in the past year? 0 1 1 0 1  Number falls in past yr: 0 1 1 0 1  Injury with Fall? 0 1 1 0 1  Risk for fall due to : History of fall(s);Impaired balance/gait;Impaired mobility History of fall(s) History of fall(s);Impaired balance/gait;Impaired mobility    Follow up Falls evaluation completed;Education provided;Falls prevention discussed Falls evaluation completed Falls evaluation completed;Education provided;Falls prevention discussed      MEDICARE RISK AT HOME: Medicare Risk at Home Any stairs in or around the home?: No If so, are there any without handrails?: No Home free of loose throw rugs in walkways, pet beds, electrical cords, etc?: Yes Adequate lighting in your home to reduce risk of falls?: Yes Life alert?: No Use of a cane, walker or w/c?: Yes Grab bars in the bathroom?: Yes Shower chair or bench in shower?: Yes Elevated toilet seat or a handicapped toilet?: Yes  TIMED UP AND GO:  Was the test performed?  No    Cognitive Function:    09/12/2023   10:27 AM 09/06/2022   10:31 AM  MMSE - Mini Mental State Exam  Not completed:  Refused  Orientation to time 5   Orientation to Place 5   Registration 3   Attention/ Calculation 5   Recall 2   Language- name 2 objects 2   Language- repeat 1   Language- follow 3 step command 3   Language- read & follow direction 1   Write a sentence 1   Copy design 0   Total score 28       07/22/2017    2:26 PM  Montreal Cognitive Assessment   Visuospatial/ Executive (0/5) 4  Naming (0/3) 3  Attention: Read  list of digits (0/2) 1  Attention: Read list of letters (0/1) 1  Attention: Serial 7 subtraction starting at 100 (0/3) 2  Language: Repeat phrase (0/2) 2  Language : Fluency (0/1) 0  Abstraction (0/2) 2  Delayed Recall (0/5) 3  Orientation (0/6) 6  Total 24  Adjusted Score (based on education) 25      09/06/2022   10:31 AM  6CIT Screen  What Year? 0 points  What month? 0 points  What time? 0 points  Count back from 20 0 points  Months in reverse 0 points  Repeat phrase 0 points  Total Score 0 points    Immunizations Immunization History  Administered Date(s) Administered   Fluad Quad(high Dose 65+) 10/02/2022   Influenza Split 09/24/2010, 09/04/2011, 09/18/2012, 09/08/2013, 09/09/2015, 10/21/2016, 09/22/2019, 09/13/2021   Influenza-Unspecified 11/09/2014, 09/30/2015, 09/17/2016, 09/01/2017, 09/10/2018   Moderna Sars-Covid-2 Vaccination 12/13/2019, 01/09/2021, 05/16/2021   PFIZER(Purple Top)SARS-COV-2 Vaccination 01/10/2020, 10/15/2022   Pneumococcal Conjugate-13 04/18/2014   Pneumococcal Polysaccharide-23 09/20/2002, 09/23/2019   Td 10/11/2004   Tdap 06/14/2016   Zoster Recombinant(Shingrix) 02/10/2019, 05/04/2019   Zoster, Live 01/23/2009    TDAP status: Up to date  Flu Vaccine status: Due, Education has been provided regarding the importance of this vaccine. Advised may receive this vaccine at local pharmacy or Health Dept. Aware to provide a copy of the vaccination record if obtained from local pharmacy or Health Dept. Verbalized acceptance and understanding.  Pneumococcal vaccine status: Up to date  Covid-19 vaccine status: Completed vaccines  Qualifies for Shingles  Vaccine? Yes   Zostavax completed Yes   Shingrix Completed?: Yes  Screening Tests Health Maintenance  Topic Date Due   INFLUENZA VACCINE  07/10/2023   COVID-19 Vaccine (6 - 2023-24 season) 08/10/2023   Medicare Annual Wellness (AWV)  09/11/2024   DTaP/Tdap/Td (3 - Td or Tdap) 06/14/2026    Pneumonia Vaccine 33+ Years old  Completed   DEXA SCAN  Completed   Zoster Vaccines- Shingrix  Completed   HPV VACCINES  Aged Out    Health Maintenance  Health Maintenance Due  Topic Date Due   INFLUENZA VACCINE  07/10/2023   COVID-19 Vaccine (6 - 2023-24 season) 08/10/2023    Colorectal cancer screening: No longer required.   Mammogram status: No longer required due to advanced age.  Bone Density status: Completed 2002. Results reflect: Bone density results: OSTEOPENIA. Repeat every 2> refusing future studies years.  Lung Cancer Screening: (Low Dose CT Chest recommended if Age 24-80 years, 20 pack-year currently smoking OR have quit w/in 15years.) does not qualify.   Lung Cancer Screening Referral: No  Additional Screening:  Hepatitis C Screening: does not qualify; Completed   Vision Screening: Recommended annual ophthalmology exams for early detection of glaucoma and other disorders of the eye. Is the patient up to date with their annual eye exam?  No  Who is the provider or what is the name of the office in which the patient attends annual eye exams? In house provider if needed If pt is not established with a provider, would they like to be referred to a provider to establish care? No .   Dental Screening: Recommended annual dental exams for proper oral hygiene  Diabetic Foot Exam: Diabetic Foot Exam: Completed 09/12/2023  Community Resource Referral / Chronic Care Management: CRR required this visit?  No   CCM required this visit?  No     Plan:     I have personally reviewed and noted the following in the patient's chart:   Medical and social history Use of alcohol, tobacco or illicit drugs  Current medications and supplements including opioid prescriptions. Patient is not currently taking opioid prescriptions. Functional ability and status Nutritional status Physical activity Advanced directives List of other physicians Hospitalizations, surgeries, and ER  visits in previous 12 months Vitals Screenings to include cognitive, depression, and falls Referrals and appointments  In addition, I have reviewed and discussed with patient certain preventive protocols, quality metrics, and best practice recommendations. A written personalized care plan for preventive services as well as general preventive health recommendations were provided to patient.     Octavia Heir, NP   09/12/2023   After Visit Summary: (MyChart) Due to this being a telephonic visit, the after visit summary with patients personalized plan was offered to patient via MyChart   Nurse Notes: Lives in assisting living. Does not want future mammograms. She will receive flu and covid vaccines this month per facility. MMSE 28/30. Ambulates with power wheelchair.

## 2023-10-08 ENCOUNTER — Non-Acute Institutional Stay: Payer: PPO | Admitting: Orthopedic Surgery

## 2023-10-08 ENCOUNTER — Encounter: Payer: Self-pay | Admitting: Orthopedic Surgery

## 2023-10-08 DIAGNOSIS — D649 Anemia, unspecified: Secondary | ICD-10-CM

## 2023-10-08 DIAGNOSIS — M79662 Pain in left lower leg: Secondary | ICD-10-CM

## 2023-10-08 DIAGNOSIS — I471 Supraventricular tachycardia, unspecified: Secondary | ICD-10-CM | POA: Diagnosis not present

## 2023-10-08 DIAGNOSIS — I2510 Atherosclerotic heart disease of native coronary artery without angina pectoris: Secondary | ICD-10-CM

## 2023-10-08 DIAGNOSIS — I10 Essential (primary) hypertension: Secondary | ICD-10-CM | POA: Diagnosis not present

## 2023-10-08 DIAGNOSIS — N1831 Chronic kidney disease, stage 3a: Secondary | ICD-10-CM

## 2023-10-08 DIAGNOSIS — G20A1 Parkinson's disease without dyskinesia, without mention of fluctuations: Secondary | ICD-10-CM

## 2023-10-08 DIAGNOSIS — M79661 Pain in right lower leg: Secondary | ICD-10-CM

## 2023-10-08 DIAGNOSIS — E782 Mixed hyperlipidemia: Secondary | ICD-10-CM | POA: Diagnosis not present

## 2023-10-08 DIAGNOSIS — K219 Gastro-esophageal reflux disease without esophagitis: Secondary | ICD-10-CM

## 2023-10-08 DIAGNOSIS — F5101 Primary insomnia: Secondary | ICD-10-CM

## 2023-10-08 NOTE — Progress Notes (Signed)
Location:  [facility] Room Number: 25/A Place of Service:  ALF [phone]) Provider:  Octavia Heir, NP   Mahlon Gammon, MD  Patient Care Team: Mahlon Gammon, MD as PCP - General (Internal Medicine) Tonny Bollman, MD as PCP - Cardiology (Cardiology) Kirby Funk, MD (Inactive) (Internal Medicine) Tat, Octaviano Batty, DO as Consulting Physician (Neurology)  Extended Emergency Contact Information Primary Emergency Contact: Larence Penning Home Phone: [phone] Mobile Phone: [phone] Relation: Daughter  Code Status:  DNR Goals of care: Advanced Directive information    06/26/2023   10:24 AM  Advanced Directives  Does Patient Have a Medical Advance Directive? Yes  Type of Advance Directive Out of facility DNR (pink MOST or yellow form)  Does patient want to make changes to medical advance directive? No - Patient declined     Chief Complaint  Patient presents with   Medical Management of Chronic Issues    HPI:  Pt is a 89 y.o. female seen today for medical management of chronic diseases.    She currently resides on the assisted living unit at [facility]. PMH: atrial fibrillation, MI, acute PE, DVT, HTN, HLD, GERD, constipation, parkinson's, CKD, right hemiarthroplasty 07/03/2022, and unstable gait.   Lower leg pain- increased pain to legs/feet at bedtime, described as aching/burning, not interested in trying medication at this time  HTN- BUN/creat 33/1.1 01/06/2023, remains on metoprolol (reduces 07/2023 due to bradycardia) HLD- Total 142, LDL 76 (12/2022), AST/ALT 56/2 01/06/2023, remains on atorvastatin  H/o SVT- remains on metoprolol CKD 3a- BUN/creat 33/1.1, GFR 47 07/2022 01/06/2023 CAD- MI in 2010, s/p stent, remains on asa and statin Parkinson's- followed by neurology, remains on sinemet, ambulates with [facility] Anemia- hgb 11.7 (01/06/2023), ferritin 87, iron 47, TIBC 232 07/23/2022, remains on ferrous sulfate GERD-  remains on  Protonix Insomnia- remains on low dose Remeron  No recent falls or injuries.   Flu vaccine given yesterday> tolerated well.   Recent blood pressures:  10/23- 140/80  10/16- 163/81  10/09- 1202/68  Recent weights:  10/01- 121.4 lbs  09/02- 122.2 lbs  08/05- 120.4 lbs    Past Medical History:  Diagnosis Date   Coronary artery disease    Methicillin resistant Staphylococcus aureus in conditions classified elsewhere and of unspecified site    MI (myocardial infarction) (HCC)    Other and unspecified hyperlipidemia    Unspecified essential hypertension    Past Surgical History:  Procedure Laterality Date   CATARACT EXTRACTION, BILATERAL     CORONARY ANGIOPLASTY WITH STENT PLACEMENT     HIP ARTHROPLASTY Right 07/04/2022   Procedure: RIGHT ANTERIOR HIP REPLACEMENT ;  Surgeon: Kathryne Hitch, MD;  Location: [facility];  Service: Orthopedics;  Laterality: Right;    Allergies  Allergen Reactions   Norvasc [Amlodipine] Anaphylaxis, Swelling and Other (See Comments)    Edema   Altace [Ramipril] Cough   Fosamax [Alendronate] Other (See Comments)    Dysphagia    Outpatient Encounter Medications as of 10/08/2023  Medication Sig   acetaminophen (TYLENOL) 325 MG tablet Take 650 mg by mouth 3 (three) times daily.   aspirin EC 81 MG tablet Take 81 mg by mouth daily. Swallow whole.   atorvastatin (LIPITOR) 10 MG tablet Take 10 mg by mouth at bedtime.   carbidopa-levodopa (SINEMET IR) 25-100 MG tablet Take 1 tablet at 7 AM ,Take 1 tablet at 11 Am ,and Take 1 tablet at 4 pm   docusate sodium (COLACE) 100 MG capsule Take 1  capsule (100 mg total) by mouth 2 (two) times daily.   ferrous sulfate 325 (65 FE) MG tablet Take 325 mg by mouth 3 (three) times a week. Monday, Wednesday, and Friday.   furosemide (LASIX) 20 MG tablet Take 20 mg by mouth daily as needed for fluid.   methocarbamol (ROBAXIN) 500 MG tablet Take 250 mg by mouth at bedtime as needed for muscle spasms. Avoid Norco use  only for moderate pain.   metoprolol tartrate (LOPRESSOR) 25 MG tablet Take 25 mg by mouth 2 (two) times daily.   mirtazapine (REMERON) 7.5 MG tablet Take 7.5 mg by mouth at bedtime.   nitroGLYCERIN (NITROSTAT) 0.4 MG SL tablet Place 1 tablet (0.4 mg total) under the tongue every 5 (five) minutes as needed for chest pain.   pantoprazole (PROTONIX) 20 MG tablet Take 20 mg by mouth daily.   polyethylene glycol (MIRALAX / GLYCOLAX) 17 g packet Take 17 g by mouth daily.   zinc oxide 20 % ointment Apply 1 Application topically as needed for irritation.   No facility-administered encounter medications on file as of 10/08/2023.    Review of Systems  Constitutional:  Negative for activity change and appetite change.  HENT:  Negative for sore throat and trouble swallowing.   Eyes:  Negative for visual disturbance.  Respiratory:  Negative for cough, shortness of breath and wheezing.   Cardiovascular:  Negative for chest pain and leg swelling.  Gastrointestinal:  Negative for abdominal distention and abdominal pain.  Genitourinary:  Negative for dysuria and hematuria.  Musculoskeletal:  Positive for arthralgias, gait problem and myalgias.  Skin:  Negative for wound.  Neurological:  Positive for weakness. Negative for dizziness and light-headedness.  Psychiatric/Behavioral:  Negative for confusion, dysphoric mood and sleep disturbance. The patient is not nervous/anxious.     Immunization History  Administered Date(s) Administered   Fluad Quad(high Dose 65+) 10/02/2022   Influenza Split 09/24/2010, 09/04/2011, 09/18/2012, 09/08/2013, 09/09/2015, 10/21/2016, 09/22/2019, 09/13/2021   Influenza-Unspecified 11/09/2014, 09/30/2015, 09/17/2016, 09/01/2017, 09/10/2018   Moderna Sars-Covid-2 Vaccination 12/13/2019, 01/09/2021, 05/16/2021   PFIZER(Purple Top)SARS-COV-2 Vaccination 01/10/2020, 10/15/2022   Pneumococcal Conjugate-13 04/18/2014   Pneumococcal Polysaccharide-23 09/20/2002, 09/23/2019   Td  10/11/2004   Tdap 06/14/2016   Zoster Recombinant(Shingrix) 02/10/2019, 05/04/2019   Zoster, Live 01/23/2009   Pertinent  Health Maintenance Due  Topic Date Due   INFLUENZA VACCINE  07/10/2023   DEXA SCAN  Completed      07/23/2022   10:00 PM 07/24/2022   10:01 AM 09/06/2022   10:30 AM 10/25/2022   12:09 PM 09/12/2023   10:27 AM  Fall Risk  Falls in the past year?   1 1 0  Was there an injury with Fall?   1 1 0  Fall Risk Category Calculator   3 3 0  Fall Risk Category (Retired)   [employer]   (RETIRED) Patient Fall Risk Level Moderate fall risk Low fall risk High fall risk High fall risk   Patient at Risk for Falls Due to   History of fall(s);Impaired balance/gait;Impaired mobility History of fall(s) History of fall(s);Impaired balance/gait;Impaired mobility  Fall risk Follow up   Falls evaluation completed;Education provided;Falls prevention discussed Falls evaluation completed Falls evaluation completed;Education provided;Falls prevention discussed   Functional Status Survey:    Vitals:   10/08/23 0945  BP: (!) 140/80  Pulse: 73  Resp: 16  Temp: 98.2 F (36.8 C)  SpO2: 98%  Weight: 121 lb 6.4 oz (55.1 kg)  Height: 5\' 6"  (1.676 m)  Body mass index is 19.59 kg/m. Physical Exam Vitals reviewed.  Constitutional:      General: She is not in acute distress. HENT:     Head: Normocephalic.     Right Ear: There is no impacted cerumen.     Left Ear: There is no impacted cerumen.     Nose: Nose normal.     Mouth/Throat:     Mouth: Mucous membranes are moist.  Eyes:     General:        Right eye: No discharge.        Left eye: No discharge.  Cardiovascular:     Rate and Rhythm: Normal rate and regular rhythm.     Pulses: Normal pulses.     Heart sounds: Normal heart sounds.  Pulmonary:     Effort: Pulmonary effort is normal. No respiratory distress.     Breath sounds: Normal breath sounds. No wheezing.  Abdominal:     General: Bowel sounds are normal.      Palpations: Abdomen is soft.  Musculoskeletal:     Cervical back: Neck supple.     Right lower leg: No edema.     Left lower leg: No edema.  Skin:    General: Skin is warm.     Capillary Refill: Capillary refill takes less than 2 seconds.  Neurological:     General: No focal deficit present.     Mental Status: She is alert and oriented to person, place, and time.     Motor: Weakness present.     Gait: Gait abnormal.     Comments: [facility]  Psychiatric:        Mood and Affect: Mood normal.     Labs reviewed: Recent Labs    01/06/23 0000  NA 141  K 4.3  CL 108  CO2 26*  BUN 33*  CREATININE 1.1  CALCIUM 8.8   Recent Labs    01/06/23 0000  AST 12*  ALT 3*  ALKPHOS 74  ALBUMIN 3.6   Recent Labs    01/06/23 0000  WBC 5.6  NEUTROABS 3,646.00  HGB 11.7*  HCT 35*  PLT 215   Lab Results  Component Value Date   TSH 0.434 07/23/2022   No results found for: "HGBA1C" Lab Results  Component Value Date   CHOL 142 01/06/2023   HDL 46 01/06/2023   LDLCALC 76 01/06/2023   TRIG 121 01/06/2023   CHOLHDL 3 09/22/2009    Significant Diagnostic Results in last 30 days:  No results found.  Assessment/Plan 1. Pain in both lower legs - ongoing - suspect neuropathy/RLS - not interested in medication at this time  2. Essential hypertension - controlled - cont metoprolol  3. Mixed hyperlipidemia - LDL 76 12/2022 - cont atorvastatin  4. SVT (supraventricular tachycardia) (HCC) - HR< 100 with metoprolol - TSH  5. Stage 3a chronic kidney disease (HCC) - encourage hydration with water - avoid NSAIDS  6. Atherosclerosis of native coronary artery without angina pectoris, unspecified whether native or transplanted heart - h/o MI 2010 - cont asa and statin  7. Parkinson's disease without dyskinesia or fluctuating manifestations (HCC) - followed by neurology - cont sinemet  8. Normocytic anemia - hgb stable - cont ferrous sulfate  9. Gastroesophageal reflux  disease without esophagitis - stable with Protonix  10. Primary insomnia - stable with low dose Remeron    Family/ staff Communication: plan discussed with patient and nurse  Labs/tests ordered:  cbc/diff, cmp, TSH 10/13/2023

## 2023-10-20 ENCOUNTER — Other Ambulatory Visit: Payer: Self-pay | Admitting: Orthopedic Surgery

## 2023-10-20 DIAGNOSIS — F419 Anxiety disorder, unspecified: Secondary | ICD-10-CM

## 2023-10-20 DIAGNOSIS — F4321 Adjustment disorder with depressed mood: Secondary | ICD-10-CM

## 2023-10-20 MED ORDER — LORAZEPAM 0.5 MG PO TABS: 0.5000 mg | ORAL_TABLET | Freq: Every day | ORAL | 0 refills | Status: AC

## 2023-10-20 NOTE — Progress Notes (Signed)
Husband passed 11/09. She was initially prescribed ativan for increased anxiety. She has not been asking for it per nursing. She has not been sleeping well at times. Plan to schedule at bedtime x 2 weeks. Daughter and I also discussed prescribing SSRI in future if anxiety/mood does not improve.

## 2023-12-01 ENCOUNTER — Other Ambulatory Visit: Payer: Self-pay | Admitting: Orthopedic Surgery

## 2023-12-01 DIAGNOSIS — F32A Depression, unspecified: Secondary | ICD-10-CM

## 2023-12-01 DIAGNOSIS — K219 Gastro-esophageal reflux disease without esophagitis: Secondary | ICD-10-CM

## 2023-12-01 DIAGNOSIS — E782 Mixed hyperlipidemia: Secondary | ICD-10-CM

## 2023-12-01 DIAGNOSIS — G20A1 Parkinson's disease without dyskinesia, without mention of fluctuations: Secondary | ICD-10-CM

## 2023-12-01 DIAGNOSIS — I10 Essential (primary) hypertension: Secondary | ICD-10-CM

## 2023-12-01 MED ORDER — CARBIDOPA-LEVODOPA 25-100 MG PO TABS: 1.0000 | ORAL_TABLET | Freq: Three times a day (TID) | ORAL | 0 refills | Status: DC

## 2023-12-01 MED ORDER — MIRTAZAPINE 7.5 MG PO TABS: 7.5000 mg | ORAL_TABLET | Freq: Every day | ORAL | 0 refills | Status: AC

## 2023-12-01 MED ORDER — PANTOPRAZOLE SODIUM 20 MG PO TBEC: 20.0000 mg | DELAYED_RELEASE_TABLET | Freq: Every day | ORAL | 0 refills | Status: DC

## 2023-12-01 MED ORDER — METOPROLOL TARTRATE 25 MG PO TABS: 25.0000 mg | ORAL_TABLET | Freq: Two times a day (BID) | ORAL | 0 refills | Status: DC

## 2023-12-01 MED ORDER — ATORVASTATIN CALCIUM 10 MG PO TABS: 10.0000 mg | ORAL_TABLET | Freq: Every day | ORAL | 0 refills | Status: AC

## 2023-12-01 NOTE — Progress Notes (Signed)
Family requesting 5 days medication be sent to [facility]. Patient is going home with her to [facility] for Holidays.

## 2023-12-24 ENCOUNTER — Encounter: Payer: Self-pay | Admitting: Internal Medicine

## 2023-12-24 ENCOUNTER — Non-Acute Institutional Stay: Payer: PPO | Admitting: Internal Medicine

## 2023-12-24 DIAGNOSIS — I10 Essential (primary) hypertension: Secondary | ICD-10-CM

## 2023-12-24 DIAGNOSIS — F32A Depression, unspecified: Secondary | ICD-10-CM

## 2023-12-24 DIAGNOSIS — K219 Gastro-esophageal reflux disease without esophagitis: Secondary | ICD-10-CM | POA: Diagnosis not present

## 2023-12-24 DIAGNOSIS — M79662 Pain in left lower leg: Secondary | ICD-10-CM

## 2023-12-24 DIAGNOSIS — I471 Supraventricular tachycardia, unspecified: Secondary | ICD-10-CM

## 2023-12-24 DIAGNOSIS — M79661 Pain in right lower leg: Secondary | ICD-10-CM

## 2023-12-24 DIAGNOSIS — E782 Mixed hyperlipidemia: Secondary | ICD-10-CM

## 2023-12-24 DIAGNOSIS — F5101 Primary insomnia: Secondary | ICD-10-CM

## 2023-12-24 DIAGNOSIS — G20A1 Parkinson's disease without dyskinesia, without mention of fluctuations: Secondary | ICD-10-CM | POA: Diagnosis not present

## 2023-12-24 DIAGNOSIS — N1831 Chronic kidney disease, stage 3a: Secondary | ICD-10-CM

## 2023-12-24 NOTE — Progress Notes (Signed)
Location:  [facility] Room Number: 25A Place of Service:  SNF [phone]) Provider:  Mahlon Gammon, MD   Mahlon Gammon, MD  Patient Care Team: Mahlon Gammon, MD as PCP - General (Internal Medicine) Tonny Bollman, MD as PCP - Cardiology (Cardiology) Kirby Funk, MD (Inactive) (Internal Medicine) Tat, Octaviano Batty, DO as Consulting Physician (Neurology)  Extended Emergency Contact Information Primary Emergency Contact: Larence Penning Home Phone: [phone] Mobile Phone: [phone] Relation: Daughter  Code Status:  DNR Goals of care: Advanced Directive information    12/24/2023    4:49 PM  Advanced Directives  Does Patient Have a Medical Advance Directive? Yes  Type of Advance Directive Out of facility DNR (pink MOST or yellow form)  Does patient want to make changes to medical advance directive? No - Patient declined  Pre-existing out of facility DNR order (yellow form or pink MOST form) Yellow form placed in chart (order not valid for inpatient use)     Chief Complaint  Patient presents with   Medical Management of Chronic Issues    Patient is being seen for a routine visit     HPI:  Pt is a 89 y.o. female seen today for medical management of chronic diseases.    Lives iN AL in [facility] Uses walker and Power Chair to ambulate   Patient Has h/o Right Leg DVT and PE after Right hip hemiarthroplasty   H/o PSVT  On Metoprolol    GERD, HLD,HTN, CKD ,stage 3 a and Parkinson disease Cognitive impairment   Acute issues Depression Lost her husband few months ago But says has been doing well  C/o Leg pain due to neuropathy   Past Medical History:  Diagnosis Date   Coronary artery disease    Methicillin resistant Staphylococcus aureus in conditions classified elsewhere and of unspecified site    MI (myocardial infarction) (HCC)    Other and unspecified hyperlipidemia    Unspecified essential hypertension    Past Surgical History:  Procedure  Laterality Date   CATARACT EXTRACTION, BILATERAL     CORONARY ANGIOPLASTY WITH STENT PLACEMENT     HIP ARTHROPLASTY Right 07/04/2022   Procedure: RIGHT ANTERIOR HIP REPLACEMENT ;  Surgeon: Kathryne Hitch, MD;  Location: [facility];  Service: Orthopedics;  Laterality: Right;    Allergies  Allergen Reactions   Norvasc [Amlodipine] Anaphylaxis, Swelling and Other (See Comments)    Edema   Altace [Ramipril] Cough   Fosamax [Alendronate] Other (See Comments)    Dysphagia    Outpatient Encounter Medications as of 12/24/2023  Medication Sig   acetaminophen (TYLENOL) 325 MG tablet Take 650 mg by mouth 3 (three) times daily.   aspirin EC 81 MG tablet Take 81 mg by mouth daily. Swallow whole.   carbidopa-levodopa (SINEMET IR) 25-100 MG tablet Take 1 tablet at 7 AM ,Take 1 tablet at 11 Am ,and Take 1 tablet at 4 pm   carbidopa-levodopa (SINEMET IR) 25-100 MG tablet Take 1 tablet by mouth in the morning, at noon, and at bedtime.   docusate sodium (COLACE) 100 MG capsule Take 1 capsule (100 mg total) by mouth 2 (two) times daily.   ferrous sulfate 325 (65 FE) MG tablet Take 325 mg by mouth 3 (three) times a week. Monday, Wednesday, and Friday.   furosemide (LASIX) 20 MG tablet Take 20 mg by mouth daily as needed for fluid.   methocarbamol (ROBAXIN) 500 MG tablet Take 250 mg by mouth at bedtime as needed for muscle  spasms. Avoid Norco use only for moderate pain.   mirtazapine (REMERON) 7.5 MG tablet Take 1 tablet (7.5 mg total) by mouth at bedtime.   nitroGLYCERIN (NITROSTAT) 0.4 MG SL tablet Place 1 tablet (0.4 mg total) under the tongue every 5 (five) minutes as needed for chest pain.   polyethylene glycol (MIRALAX / GLYCOLAX) 17 g packet Take 17 g by mouth daily.   zinc oxide 20 % ointment Apply 1 Application topically as needed for irritation.   atorvastatin (LIPITOR) 10 MG tablet Take 1 tablet (10 mg total) by mouth at bedtime for 5 days.   metoprolol tartrate (LOPRESSOR) 25 MG tablet Take 1  tablet (25 mg total) by mouth 2 (two) times daily for 5 days.   pantoprazole (PROTONIX) 20 MG tablet Take 1 tablet (20 mg total) by mouth daily for 5 days.   No facility-administered encounter medications on file as of 12/24/2023.    Review of Systems  Constitutional:  Negative for activity change and appetite change.  HENT: Negative.    Respiratory:  Negative for cough and shortness of breath.   Cardiovascular:  Negative for leg swelling.  Gastrointestinal:  Negative for constipation.  Genitourinary: Negative.   Musculoskeletal:  Positive for arthralgias, gait problem and myalgias.  Skin: Negative.   Neurological:  Negative for dizziness and weakness.  Psychiatric/Behavioral:  Positive for dysphoric mood. Negative for confusion and sleep disturbance.     Immunization History  Administered Date(s) Administered   Fluad Quad(high Dose 65+) 10/02/2022   Fluad Trivalent(High Dose 65+) 10/07/2023   Influenza Split 09/24/2010, 09/04/2011, 09/18/2012, 09/08/2013, 09/09/2015, 10/21/2016, 09/22/2019, 09/13/2021   Influenza-Unspecified 11/09/2014, 09/30/2015, 09/17/2016, 09/01/2017, 09/10/2018   Moderna Sars-Covid-2 Vaccination 12/13/2019, 01/09/2021, 05/16/2021   PFIZER(Purple Top)SARS-COV-2 Vaccination 01/10/2020, 10/15/2022   Pneumococcal Conjugate-13 04/18/2014   Pneumococcal Polysaccharide-23 09/20/2002, 09/23/2019   Td 10/11/2004   Tdap 06/14/2016   Zoster Recombinant(Shingrix) 02/10/2019, 05/04/2019   Zoster, Live 01/23/2009   Pertinent  Health Maintenance Due  Topic Date Due   INFLUENZA VACCINE  Completed   DEXA SCAN  Completed      07/24/2022   10:01 AM 09/06/2022   10:30 AM 10/25/2022   12:09 PM 09/12/2023   10:27 AM 10/08/2023   11:34 AM  Fall Risk  Falls in the past year?  1 1 0 0  Was there an injury with Fall?  1 1 0 0  Fall Risk Category Calculator  3 3 0 0  Fall Risk Category (Retired)  [employer]    (RETIRED) Patient Fall Risk Level Low fall risk High fall risk  High fall risk    Patient at Risk for Falls Due to  History of fall(s);Impaired balance/gait;Impaired mobility History of fall(s) History of fall(s);Impaired balance/gait;Impaired mobility History of fall(s);Impaired balance/gait;Impaired mobility  Fall risk Follow up  Falls evaluation completed;Education provided;Falls prevention discussed Falls evaluation completed Falls evaluation completed;Education provided;Falls prevention discussed Falls evaluation completed;Education provided;Falls prevention discussed   Functional Status Survey:    Vitals:   12/24/23 1647  BP: 114/72  Pulse: 60  Resp: 20  Temp: 97.6 F (36.4 C)  TempSrc: Temporal  SpO2: 94%  Weight: 124 lb (56.2 kg)  Height: 5\' 6"  (1.676 m)   Body mass index is 20.01 kg/m. Physical Exam Vitals reviewed.  Constitutional:      Appearance: Normal appearance.  HENT:     Head: Normocephalic.     Nose: Nose normal.     Mouth/Throat:     Mouth: Mucous membranes are moist.  Pharynx: Oropharynx is clear.  Eyes:     Pupils: Pupils are equal, round, and reactive to light.  Cardiovascular:     Rate and Rhythm: Normal rate and regular rhythm.     Pulses: Normal pulses.     Heart sounds: Normal heart sounds. No murmur heard. Pulmonary:     Effort: Pulmonary effort is normal.     Breath sounds: Normal breath sounds.  Abdominal:     General: Abdomen is flat. Bowel sounds are normal.     Palpations: Abdomen is soft.  Musculoskeletal:        General: No swelling.     Cervical back: Neck supple.  Skin:    General: Skin is warm.  Neurological:     General: No focal deficit present.     Mental Status: She is alert and oriented to person, place, and time.  Psychiatric:        Mood and Affect: Mood normal.        Thought Content: Thought content normal.     Labs reviewed: Recent Labs    01/06/23 0000  NA 141  K 4.3  CL 108  CO2 26*  BUN 33*  CREATININE 1.1  CALCIUM 8.8   Recent Labs    01/06/23 0000  AST  12*  ALT 3*  ALKPHOS 74  ALBUMIN 3.6   Recent Labs    01/06/23 0000  WBC 5.6  NEUTROABS 3,646.00  HGB 11.7*  HCT 35*  PLT 215   Lab Results  Component Value Date   TSH 0.434 07/23/2022   No results found for: "HGBA1C" Lab Results  Component Value Date   CHOL 142 01/06/2023   HDL 46 01/06/2023   LDLCALC 76 01/06/2023   TRIG 121 01/06/2023   CHOLHDL 3 09/22/2009    Significant Diagnostic Results in last 30 days:  No results found.  Assessment/Plan 1. Parkinson's disease without dyskinesia or fluctuating manifestations (HCC) (Primary) Need to follow with Neurology On Sinemet 2. Essential hypertension Metoprolol  3. Mixed hyperlipidemia Statin LDL 73 in 08/24  4. Gastroesophageal reflux disease without esophagitis Protonix  5. Depression, unspecified depression type Doing well oN Remeron Does nto want me to change the dose  6. Pain in both lower legs Take Tylenol PRN  7. SVT (supraventricular tachycardia) (HCC) On Low dose of Metoprolol  8. Stage 3a chronic kidney disease (HCC) stable  9. Primary insomnia Remeron    Family/ staff Communication:   Labs/tests ordered:

## 2024-02-05 ENCOUNTER — Telehealth: Payer: Self-pay | Admitting: Cardiovascular Disease

## 2024-02-05 NOTE — Telephone Encounter (Signed)
 Pt c/o Shortness Of Breath: STAT if SOB developed within the last 24 hours or pt is noticeably SOB on the phone  1. Are you currently SOB (can you hear that pt is SOB on the phone)? No, pt's daughter on the line  2. How long have you been experiencing SOB? Noticed yesterday when seeing the patient  3. Are you SOB when sitting or when up moving around? Sitting   4. Are you currently experiencing any other symptoms? Pt's daughter stated the patient's husband passed away recently. Since then, the patient has been depressed and lethargic.

## 2024-02-06 NOTE — Telephone Encounter (Signed)
 Returned call to daughter who states patient's SOB has been a gradual progression over the last several years. She noticed during a visit two days ago that she was short of breath even when sitting. She was able to get her into the car, but pt remained SOB long after the exertion of getting into the car. Daughter is a Engineer, civil (consulting) and obtained below vitals: 120/75, 55bpm, bpm 19, 96% room air Daughter states that patient didn't have any LE swelling and weight hasn't changed. She mentions that mother has a DNR and "is done" since losing her husband who she sat with daily for the last 3 years. Daughter ultimately wants to know if Dr Excell Seltzer would do anything differently in regards to SOB. Advised her that no, likely not. We could repeat ECHO to check cardiac output. Daughter states mother likely won't comply with any procedures, but she would speak with her about this. She is working to convince her to go with the family to the beach this summer which she hopes will help her state of mind. Advised daughter to keep upcoming appt in 2 weeks with our office to revisit the ECHO possibilty.

## 2024-02-19 LAB — CBC AND DIFFERENTIAL
HCT: 39 (ref 36–46)
Hemoglobin: 12.8 (ref 12.0–16.0)
Platelets: 252 K/uL (ref 150–400)
WBC: 5.5

## 2024-02-19 LAB — BASIC METABOLIC PANEL WITH GFR
BUN: 34 — AB (ref 4–21)
CO2: 27 — AB (ref 13–22)
Chloride: 109 — AB (ref 99–108)
Creatinine: 1.3 — AB (ref 0.5–1.1)
Glucose: 79
Potassium: 4.8 meq/L (ref 3.5–5.1)
Sodium: 143 (ref 137–147)

## 2024-02-19 LAB — COMPREHENSIVE METABOLIC PANEL WITH GFR
Calcium: 8.8 (ref 8.7–10.7)
eGFR: 40

## 2024-02-19 LAB — CBC: RBC: 4.2 (ref 3.87–5.11)

## 2024-02-21 NOTE — Progress Notes (Deleted)
  Cardiology Office Note:  .   Date:  02/21/2024  ID:  Taylor Mendoza, DOB July 04, 1930, MRN [id number] PCP: Mahlon Gammon, MD  [facility] Providers Cardiologist:  Tonny Bollman, MD { Click to update primary MD,subspecialty MD or APP then REFRESH:1}   Patient Profile: .      PMH Paroxysmal atrial tachycardia Coronary artery disease Remote MI in 2010 HFmrEF  History of CAD with remote MI in 2010. She had a mechanical fall and underwent right hip arthroplasty in July 2023.    Last seen in cardiology clinic by Dr. Excell Seltzer 08/06/2022.  On July 23, 2022, she presented to ED with weakness, heart palpitations, and dizziness and was found to be in SVT.  Review of her rhythm strips was felt to be atrial tachycardia.  She had no chest pain or pressure.  At the time she was in short-term SNF for rehab but was going back to assisted living in the near future.  Her husband was in the memory care unit.  She was having no further palpitations on metoprolol tartrate.  She was on apixaban for a 19-month course following DVT/PE.  Cardiac monitor completed 08/20/2022 revealed basic rhythm sinus bradycardia with an average HR of 49 bpm, 1 short 4 beat ventricular run, frequent SVT with burden of 6%, 116-minute supraventricular run with max HR 187 bpm, suspected atrial tachycardia.  No clear evidence of atrial fibrillation or flutter, no high-grade AV block.  She was advised to continue Lopressor 25 mg twice daily and return in 6 months for follow-up.  Her daughter called our office on 02/05/2024 to report patient's increased shortness of breath.  Her daughter is a Engineer, civil (consulting) and obtained vital signs BP 120/75, HR 55 bpm, O2 96% on room air.  No increased weight and no edema noted.  Patient's husband passed away which had been very traumatizing for the patient.  Consideration of echocardiogram discussed.      History of Present Illness: .   Taylor Mendoza is a *** 89 y.o. female ***   Discussed the use of AI  scribe software for clinical note transcription with the patient, who gave verbal consent to proceed.   ROS: ***       Studies Reviewed: Marland Kitchen         No results found for: "LIPOA"   *** Risk Assessment/Calculations:   {Does this patient have ATRIAL FIBRILLATION?[phone]} No BP recorded.  {Refresh Note OR Click here to enter BP  :1}***       Physical Exam:   VS:  There were no vitals taken for this visit.   Wt Readings from Last 3 Encounters:  12/24/23 124 lb (56.2 kg)  10/08/23 121 lb 6.4 oz (55.1 kg)  09/12/23 121 lb 6.4 oz (55.1 kg)    GEN: Well nourished, well developed in no acute distress NECK: No JVD; No carotid bruits CARDIAC: ***RRR, no murmurs, rubs, gallops RESPIRATORY:  Clear to auscultation without rales, wheezing or rhonchi  ABDOMEN: Soft, non-tender, non-distended EXTREMITIES:  No edema; No deformity     ASSESSMENT AND PLAN: .    Shortness of breath:  SVT/PAT:  CAD:  Hyperlipidemia LDL goal < 70:    {Are you ordering a CV Procedure (e.g. stress test, cath, DCCV, TEE, etc)?   Press F2        :[id number]}  Disposition:***  Signed, Eligha Bridegroom, NP-C

## 2024-02-23 ENCOUNTER — Encounter: Payer: Self-pay | Admitting: Orthopedic Surgery

## 2024-02-23 ENCOUNTER — Ambulatory Visit: Payer: PPO | Admitting: Nurse Practitioner

## 2024-02-23 ENCOUNTER — Non-Acute Institutional Stay: Payer: Self-pay | Admitting: Orthopedic Surgery

## 2024-02-23 DIAGNOSIS — F4321 Adjustment disorder with depressed mood: Secondary | ICD-10-CM | POA: Diagnosis not present

## 2024-02-23 NOTE — Progress Notes (Unsigned)
 Location:  [facility] Room Number: 25/A Place of Service:  ALF [phone]) Provider:  Octavia Heir, NP   Mahlon Gammon, MD  Patient Care Team: Mahlon Gammon, MD as PCP - General (Internal Medicine) Tonny Bollman, MD as PCP - Cardiology (Cardiology) Kirby Funk, MD (Inactive) (Internal Medicine) Tat, Octaviano Batty, DO as Consulting Physician (Neurology)  Extended Emergency Contact Information Primary Emergency Contact: Larence Penning Home Phone: [phone] Mobile Phone: [phone] Relation: Daughter  Code Status:  DNR Goals of care: Advanced Directive information    12/24/2023    4:49 PM  Advanced Directives  Does Patient Have a Medical Advance Directive? Yes  Type of Advance Directive Out of facility DNR (pink MOST or yellow form)  Does patient want to make changes to medical advance directive? No - Patient declined  Pre-existing out of facility DNR order (yellow form or pink MOST form) Yellow form placed in chart (order not valid for inpatient use)     Chief Complaint  Patient presents with  . Acute Visit    Lack of motivation     HPI:  Pt is a 89 y.o. female seen today for acute visit due to lack of motivation.   She currently resides on the assisted living unit at [facility]. PMH: atrial fibrillation, MI, acute PE, DVT, HTN, HLD, GERD, constipation, parkinson's, CKD, right hemiarthroplasty 07/03/2022, and unstable gait.   Recent care plan meeting with family reveals concerns for depression. She has been observed sleeping more and not leaving room often. Husband passed away 10/20/24. She was initially prescribed ativan prn for a brief period due to anxiety/insomnia. 11/2023 low dose Remeron was prescribed. Today, she reports grieving husband and feeling sad. We discussed trying another antidepressant but she refuses.      Past Medical History:  Diagnosis Date  . Coronary artery disease   . Methicillin resistant Staphylococcus aureus in  conditions classified elsewhere and of unspecified site   . MI (myocardial infarction) (HCC)   . Other and unspecified hyperlipidemia   . Unspecified essential hypertension    Past Surgical History:  Procedure Laterality Date  . CATARACT EXTRACTION, BILATERAL    . CORONARY ANGIOPLASTY WITH STENT PLACEMENT    . HIP ARTHROPLASTY Right 07/04/2022   Procedure: RIGHT ANTERIOR HIP REPLACEMENT ;  Surgeon: Kathryne Hitch, MD;  Location: [facility];  Service: Orthopedics;  Laterality: Right;    Allergies  Allergen Reactions  . Norvasc [Amlodipine] Anaphylaxis, Swelling and Other (See Comments)    Edema  . Altace [Ramipril] Cough  . Fosamax [Alendronate] Other (See Comments)    Dysphagia    Outpatient Encounter Medications as of 02/23/2024  Medication Sig  . acetaminophen (TYLENOL) 325 MG tablet Take 650 mg by mouth 3 (three) times daily.  Marland Kitchen aspirin EC 81 MG tablet Take 81 mg by mouth daily. Swallow whole.  Marland Kitchen atorvastatin (LIPITOR) 10 MG tablet Take 1 tablet (10 mg total) by mouth at bedtime for 5 days.  . carbidopa-levodopa (SINEMET IR) 25-100 MG tablet Take 1 tablet at 7 AM ,Take 1 tablet at 11 Am ,and Take 1 tablet at 4 pm  . carbidopa-levodopa (SINEMET IR) 25-100 MG tablet Take 1 tablet by mouth in the morning, at noon, and at bedtime.  . docusate sodium (COLACE) 100 MG capsule Take 1 capsule (100 mg total) by mouth 2 (two) times daily.  . ferrous sulfate 325 (65 FE) MG tablet Take 325 mg by mouth 3 (three) times a week. Monday,  Wednesday, and Friday.  . furosemide (LASIX) 20 MG tablet Take 20 mg by mouth daily as needed for fluid.  . methocarbamol (ROBAXIN) 500 MG tablet Take 250 mg by mouth at bedtime as needed for muscle spasms. Avoid Norco use only for moderate pain.  . metoprolol tartrate (LOPRESSOR) 25 MG tablet Take 1 tablet (25 mg total) by mouth 2 (two) times daily for 5 days.  . mirtazapine (REMERON) 7.5 MG tablet Take 1 tablet (7.5 mg total) by mouth at bedtime.  .  nitroGLYCERIN (NITROSTAT) 0.4 MG SL tablet Place 1 tablet (0.4 mg total) under the tongue every 5 (five) minutes as needed for chest pain.  . pantoprazole (PROTONIX) 20 MG tablet Take 1 tablet (20 mg total) by mouth daily for 5 days.  . polyethylene glycol (MIRALAX / GLYCOLAX) 17 g packet Take 17 g by mouth daily.  Marland Kitchen zinc oxide 20 % ointment Apply 1 Application topically as needed for irritation.   No facility-administered encounter medications on file as of 02/23/2024.    Review of Systems  Immunization History  Administered Date(s) Administered  . Fluad Quad(high Dose 65+) 10/02/2022  . Fluad Trivalent(High Dose 65+) 10/07/2023  . Influenza Split 09/24/2010, 09/04/2011, 09/18/2012, 09/08/2013, 09/09/2015, 10/21/2016, 09/22/2019, 09/13/2021  . Influenza-Unspecified 11/09/2014, 09/30/2015, 09/17/2016, 09/01/2017, 09/10/2018  . Moderna Sars-Covid-2 Vaccination 12/13/2019, 01/09/2021, 05/16/2021  . PFIZER(Purple Top)SARS-COV-2 Vaccination 01/10/2020, 10/15/2022  . Pneumococcal Conjugate-13 04/18/2014  . Pneumococcal Polysaccharide-23 09/20/2002, 09/23/2019  . Td 10/11/2004  . Tdap 06/14/2016  . Zoster Recombinant(Shingrix) 02/10/2019, 05/04/2019  . Zoster, Live 01/23/2009   Pertinent  Health Maintenance Due  Topic Date Due  . INFLUENZA VACCINE  Completed  . DEXA SCAN  Completed      07/24/2022   10:01 AM 09/06/2022   10:30 AM 10/25/2022   12:09 PM 09/12/2023   10:27 AM 10/08/2023   11:34 AM  Fall Risk  Falls in the past year?  1 1 0 0  Was there an injury with Fall?  1 1 0 0  Fall Risk Category Calculator  3 3 0 0  Fall Risk Category (Retired)  [employer]    (RETIRED) Patient Fall Risk Level Low fall risk High fall risk High fall risk    Patient at Risk for Falls Due to  History of fall(s);Impaired balance/gait;Impaired mobility History of fall(s) History of fall(s);Impaired balance/gait;Impaired mobility History of fall(s);Impaired balance/gait;Impaired mobility  Fall risk  Follow up  Falls evaluation completed;Education provided;Falls prevention discussed Falls evaluation completed Falls evaluation completed;Education provided;Falls prevention discussed Falls evaluation completed;Education provided;Falls prevention discussed   Functional Status Survey:    Vitals:   02/23/24 1331  BP: 120/72  Pulse: 72  Resp: 18  Temp: 97.7 F (36.5 C)  SpO2: 96%  Weight: 113 lb (51.3 kg)  Height: 5\' 6"  (1.676 m)   Body mass index is 18.24 kg/m. Physical Exam  Labs reviewed: No results for input(s): "NA", "K", "CL", "CO2", "GLUCOSE", "BUN", "CREATININE", "CALCIUM", "MG", "PHOS" in the last 8760 hours. No results for input(s): "AST", "ALT", "ALKPHOS", "BILITOT", "PROT", "ALBUMIN" in the last 8760 hours. No results for input(s): "WBC", "NEUTROABS", "HGB", "HCT", "MCV", "PLT" in the last 8760 hours. Lab Results  Component Value Date   TSH 0.434 07/23/2022   No results found for: "HGBA1C" Lab Results  Component Value Date   CHOL 142 01/06/2023   HDL 46 01/06/2023   LDLCALC 76 01/06/2023   TRIG 121 01/06/2023   CHOLHDL 3 09/22/2009    Significant Diagnostic Results in last 30  days:  No results found.  Assessment/Plan There are no diagnoses linked to this encounter.   Family/ staff Communication: ***  Labs/tests ordered:  ***

## 2024-03-31 ENCOUNTER — Non-Acute Institutional Stay: Payer: Self-pay | Admitting: Orthopedic Surgery

## 2024-03-31 ENCOUNTER — Encounter: Payer: Self-pay | Admitting: Orthopedic Surgery

## 2024-03-31 DIAGNOSIS — N1831 Chronic kidney disease, stage 3a: Secondary | ICD-10-CM

## 2024-03-31 DIAGNOSIS — F339 Major depressive disorder, recurrent, unspecified: Secondary | ICD-10-CM

## 2024-03-31 DIAGNOSIS — D649 Anemia, unspecified: Secondary | ICD-10-CM

## 2024-03-31 DIAGNOSIS — M79662 Pain in left lower leg: Secondary | ICD-10-CM

## 2024-03-31 DIAGNOSIS — I2510 Atherosclerotic heart disease of native coronary artery without angina pectoris: Secondary | ICD-10-CM

## 2024-03-31 DIAGNOSIS — I471 Supraventricular tachycardia, unspecified: Secondary | ICD-10-CM | POA: Diagnosis not present

## 2024-03-31 DIAGNOSIS — E782 Mixed hyperlipidemia: Secondary | ICD-10-CM | POA: Diagnosis not present

## 2024-03-31 DIAGNOSIS — I10 Essential (primary) hypertension: Secondary | ICD-10-CM

## 2024-03-31 DIAGNOSIS — G20A1 Parkinson's disease without dyskinesia, without mention of fluctuations: Secondary | ICD-10-CM

## 2024-03-31 DIAGNOSIS — M79661 Pain in right lower leg: Secondary | ICD-10-CM

## 2024-03-31 DIAGNOSIS — K219 Gastro-esophageal reflux disease without esophagitis: Secondary | ICD-10-CM

## 2024-03-31 MED ORDER — ACETAMINOPHEN 500 MG PO TABS: 1000.0000 mg | ORAL_TABLET | Freq: Every day | ORAL | Status: DC

## 2024-03-31 NOTE — Progress Notes (Signed)
 Location:  [facility] Room Number: 25/A Place of Service:  ALF [phone]) Provider:  Arnetha Bhat, NP   Marguerite Shiley, MD  Patient Care Team: Marguerite Shiley, MD as PCP - General (Internal Medicine) Arnoldo Lapping, MD as PCP - Cardiology (Cardiology) Lysle Saunas, MD (Inactive) (Internal Medicine) Tat, Von Grumbling, DO as Consulting Physician (Neurology)  Extended Emergency Contact Information Primary Emergency Contact: Fairy Homer Home Phone: [phone] Mobile Phone: [phone] Relation: Daughter  Code Status:  DNR Goals of care: Advanced Directive information    12/24/2023    4:49 PM  Advanced Directives  Does Patient Have a Medical Advance Directive? Yes  Type of Advance Directive Out of facility DNR (pink MOST or yellow form)  Does patient want to make changes to medical advance directive? No - Patient declined  Pre-existing out of facility DNR order (yellow form or pink MOST form) Yellow form placed in chart (order not valid for inpatient use)     Chief Complaint  Patient presents with   Medical Management of Chronic Issues    HPI:  Pt is a 89 y.o. female seen today for medical management of chronic diseases.    She currently resides on the assisted living unit at [facility]. PMH: atrial fibrillation, MI, acute PE, DVT, HTN, HLD, GERD, constipation, parkinson's, CKD, right hemiarthroplasty 07/03/2022, and unstable gait.    Lower leg pain- increased pain to legs/feet at bedtime, described as aching/burning, she has refused medication in past> willing to try increased tylenol  at bedtime today  HTN- BUN/creat 34/1.28 02/19/2024 , remains on metoprolol  (reduces 07/2023 due to bradycardia) HLD- Total 142, LDL 76 (12/2022), AST/ALT 40/1 01/06/2023, remains on atorvastatin   H/o SVT- remains on metoprolol  CKD 3a- , GFR 40 (03/13)> was 47 07/2022  CAD- MI in 2010, s/p stent, remains on asa and statin Parkinson's- followed by neurology, remains on  sinemet , ambulates with PWC Anemia- hgb 12.8 (03/13), ferritin 87, iron 47, TIBC 232 07/23/2022, remains on ferrous sulfate  GERD-  remains on Protonix  Depression/grieving- remains on low dose Remeron   04/19 slid off bed, no injury, denies pain today.   Recent weights:  04/01- 120.2 lbs  03/02- 120.0 lbs  02/03- 121.4 lbs  Recent blood pressures:  04/16- 134/60  04/09- 131/88  04/02- 100/49     Past Medical History:  Diagnosis Date   Coronary artery disease    Methicillin resistant Staphylococcus aureus in conditions classified elsewhere and of unspecified site    MI (myocardial infarction) (HCC)    Other and unspecified hyperlipidemia    Unspecified essential hypertension    Past Surgical History:  Procedure Laterality Date   CATARACT EXTRACTION, BILATERAL     CORONARY ANGIOPLASTY WITH STENT PLACEMENT     HIP ARTHROPLASTY Right 07/04/2022   Procedure: RIGHT ANTERIOR HIP REPLACEMENT ;  Surgeon: Arnie Lao, MD;  Location: [facility];  Service: Orthopedics;  Laterality: Right;    Allergies  Allergen Reactions   Norvasc [Amlodipine] Anaphylaxis, Swelling and Other (See Comments)    Edema   Altace [Ramipril] Cough   Fosamax [Alendronate] Other (See Comments)    Dysphagia    Outpatient Encounter Medications as of 03/31/2024  Medication Sig   acetaminophen  (TYLENOL ) 325 MG tablet Take 650 mg by mouth 3 (three) times daily.   aspirin  EC 81 MG tablet Take 81 mg by mouth daily. Swallow whole.   atorvastatin  (LIPITOR) 10 MG tablet Take 1 tablet (10 mg total) by mouth at bedtime for 5  days.   carbidopa -levodopa  (SINEMET  IR) 25-100 MG tablet Take 1 tablet at 7 AM ,Take 1 tablet at 11 Am ,and Take 1 tablet at 4 pm   carbidopa -levodopa  (SINEMET  IR) 25-100 MG tablet Take 1 tablet by mouth in the morning, at noon, and at bedtime.   docusate sodium  (COLACE) 100 MG capsule Take 1 capsule (100 mg total) by mouth 2 (two) times daily.   ferrous sulfate  325 (65 FE) MG tablet Take  325 mg by mouth 3 (three) times a week. Monday, Wednesday, and Friday.   furosemide (LASIX) 20 MG tablet Take 20 mg by mouth daily as needed for fluid.   methocarbamol  (ROBAXIN ) 500 MG tablet Take 250 mg by mouth at bedtime as needed for muscle spasms. Avoid Norco use only for moderate pain.   metoprolol  tartrate (LOPRESSOR ) 25 MG tablet Take 1 tablet (25 mg total) by mouth 2 (two) times daily for 5 days.   mirtazapine  (REMERON ) 7.5 MG tablet Take 1 tablet (7.5 mg total) by mouth at bedtime.   nitroGLYCERIN  (NITROSTAT ) 0.4 MG SL tablet Place 1 tablet (0.4 mg total) under the tongue every 5 (five) minutes as needed for chest pain.   pantoprazole  (PROTONIX ) 20 MG tablet Take 1 tablet (20 mg total) by mouth daily for 5 days.   polyethylene glycol (MIRALAX  / GLYCOLAX ) 17 g packet Take 17 g by mouth daily.   zinc  oxide 20 % ointment Apply 1 Application topically as needed for irritation.   No facility-administered encounter medications on file as of 03/31/2024.    Review of Systems  Constitutional:  Negative for fatigue and fever.  HENT:  Negative for sore throat and trouble swallowing.   Eyes:  Negative for visual disturbance.  Respiratory:  Negative for cough and shortness of breath.   Cardiovascular:  Negative for chest pain and leg swelling.  Gastrointestinal:  Negative for abdominal distention, abdominal pain, constipation and diarrhea.  Musculoskeletal:  Positive for gait problem. Negative for arthralgias.  Skin:  Negative for wound.  Neurological:  Positive for weakness. Negative for dizziness and headaches.  Psychiatric/Behavioral:  Positive for dysphoric mood. Negative for confusion and sleep disturbance. The patient is not nervous/anxious.     Immunization History  Administered Date(s) Administered   Fluad Quad(high Dose 65+) 10/02/2022   Fluad Trivalent(High Dose 65+) 10/07/2023   Influenza Split 09/24/2010, 09/04/2011, 09/18/2012, 09/08/2013, 09/09/2015, 10/21/2016, 09/22/2019,  09/13/2021   Influenza-Unspecified 11/09/2014, 09/30/2015, 09/17/2016, 09/01/2017, 09/10/2018   Moderna Sars-Covid-2 Vaccination 12/13/2019, 01/09/2021, 05/16/2021   PFIZER(Purple Top)SARS-COV-2 Vaccination 01/10/2020, 10/15/2022   Pneumococcal Conjugate-13 04/18/2014   Pneumococcal Polysaccharide-23 09/20/2002, 09/23/2019   Td 10/11/2004   Tdap 06/14/2016   Zoster Recombinant(Shingrix) 02/10/2019, 05/04/2019   Zoster, Live 01/23/2009   Pertinent  Health Maintenance Due  Topic Date Due   INFLUENZA VACCINE  07/09/2024   DEXA SCAN  Completed      07/24/2022   10:01 AM 09/06/2022   10:30 AM 10/25/2022   12:09 PM 09/12/2023   10:27 AM 10/08/2023   11:34 AM  Fall Risk  Falls in the past year?  1 1 0 0  Was there an injury with Fall?  1 1 0 0  Fall Risk Category Calculator  3 3 0 0  Fall Risk Category (Retired)  [employer]    (RETIRED) Patient Fall Risk Level Low fall risk High fall risk High fall risk    Patient at Risk for Falls Due to  History of fall(s);Impaired balance/gait;Impaired mobility History of fall(s) History of fall(s);Impaired balance/gait;Impaired  mobility History of fall(s);Impaired balance/gait;Impaired mobility  Fall risk Follow up  Falls evaluation completed;Education provided;Falls prevention discussed Falls evaluation completed Falls evaluation completed;Education provided;Falls prevention discussed Falls evaluation completed;Education provided;Falls prevention discussed   Functional Status Survey:    Vitals:   03/31/24 1211  BP: 134/60  Pulse: (!) 57  Resp: (!) 22  Temp: 98.4 F (36.9 C)  SpO2: 96%  Weight: 120 lb 3.2 oz (54.5 kg)  Height: 5\' 6"  (1.676 m)   Body mass index is 19.4 kg/m. Physical Exam Vitals reviewed.  Constitutional:      General: She is not in acute distress. HENT:     Head: Normocephalic.  Eyes:     General:        Right eye: No discharge.        Left eye: No discharge.  Cardiovascular:     Rate and Rhythm: Normal rate.  Rhythm irregular.     Pulses: Normal pulses.     Heart sounds: Normal heart sounds.  Pulmonary:     Effort: Pulmonary effort is normal. No respiratory distress.     Breath sounds: Normal breath sounds. No wheezing or rales.  Abdominal:     General: Bowel sounds are normal. There is no distension.     Palpations: Abdomen is soft.     Tenderness: There is no abdominal tenderness.  Musculoskeletal:     Cervical back: Neck supple.     Right lower leg: No edema.     Left lower leg: No edema.  Skin:    General: Skin is warm.     Capillary Refill: Capillary refill takes less than 2 seconds.  Neurological:     General: No focal deficit present.     Mental Status: She is alert and oriented to person, place, and time.     Motor: Weakness present.     Gait: Gait abnormal.     Comments: P[facility]  Psychiatric:        Mood and Affect: Mood normal.     Labs reviewed: No results for input(s): "NA", "K", "CL", "CO2", "GLUCOSE", "BUN", "CREATININE", "CALCIUM ", "MG", "PHOS" in the last 8760 hours. No results for input(s): "AST", "ALT", "ALKPHOS", "BILITOT", "PROT", "ALBUMIN" in the last 8760 hours. No results for input(s): "WBC", "NEUTROABS", "HGB", "HCT", "MCV", "PLT" in the last 8760 hours. Lab Results  Component Value Date   TSH 0.434 07/23/2022   No results found for: "HGBA1C" Lab Results  Component Value Date   CHOL 142 01/06/2023   HDL 46 01/06/2023   LDLCALC 76 01/06/2023   TRIG 121 01/06/2023   CHOLHDL 3 09/22/2009    Significant Diagnostic Results in last 30 days:  No results found.  Assessment/Plan 1. Pain in both lower legs (Primary) - ongoing - worse at night - will start tylenol  1000 mg po at bedtime   2. Essential hypertension - controlled with metoprolol   3. Mixed hyperlipidemia - cont atorvastatin  - lipid panel 07/2024  4. SVT (supraventricular tachycardia) (HCC) - cont metoprolol   5. Stage 3a chronic kidney disease (HCC) - encourage hydration with water -  avoid NSAIDS  6. Atherosclerosis of native coronary artery without angina pectoris, unspecified whether native or transplanted heart - cont asa and atorvastatin   7. Parkinson's disease without dyskinesia or fluctuating manifestations (HCC) - followed by neurology - cont sinemet   8. Normocytic anemia - hgb stable - cont ferrous sulfate   9. Gastroesophageal reflux disease without esophagitis - cont pantoprazole   10. Recurrent depression (HCC) - stable mood - grieving  husband - very supportive family - weight stable - cont mirtazapine     Family/ staff Communication: plan discussed with patient and nurse  Labs/tests ordered:  none

## 2024-04-05 NOTE — Progress Notes (Deleted)
  Cardiology Office Note:  .   Date:  04/05/2024  ID:  Taylor Mendoza, DOB Jul 24, 1930, MRN [id number] PCP: Marguerite Shiley, MD  [facility] Providers Cardiologist:  Arnoldo Lapping, MD { Click to update primary MD,subspecialty MD or APP then REFRESH:1}   History of Present Illness: .   Taylor Mendoza is a 89 y.o. female with history of PAT, CAD, remote MI 2010, HTN, HLD.  Patient's daughter called in saying she's had progressive worsening of SOB, she's DNR and reluctant to undergo any testing.   ROS: ***  Studies Reviewed: Aaron Aas         Prior CV Studies: {Select studies to display:26339}   2D echocardiogram 07/04/2022: 1. Left ventricular ejection fraction, by estimation, is 50%. The left  ventricle has mildly decreased function. The left ventricle demonstrates  global hypokinesis. There is mild concentric left ventricular hypertrophy.  Left ventricular diastolic  parameters are consistent with Grade I diastolic dysfunction (impaired  relaxation).   2. Right ventricular systolic function is normal. The right ventricular  size is normal. There is moderately elevated pulmonary artery systolic  pressure. The estimated right ventricular systolic pressure is 50.8 mmHg.   3. Left atrial size was moderately dilated.   4. Right atrial size was mildly dilated.   5. The mitral valve is normal in structure. Mild mitral valve  regurgitation. No evidence of mitral stenosis.   6. The aortic valve is tricuspid. There is mild calcification of the  aortic valve. Aortic valve regurgitation is moderate.   7. Aortic dilatation noted. There is severe dilatation of the ascending  aorta, measuring 52 mm.   8. The inferior vena cava is dilated in size with <50% respiratory  variability, suggesting right atrial pressure of 15 mmHg.   9. A small pericardial effusion is present.   Risk Assessment/Calculations:   {Does this patient have ATRIAL FIBRILLATION?:[id number]} No BP recorded.  {Refresh  Note OR Click here to enter BP  :1}***       Physical Exam:   VS:  There were no vitals taken for this visit.   Wt Readings from Last 3 Encounters:  03/31/24 120 lb 3.2 oz (54.5 kg)  02/23/24 113 lb (51.3 kg)  12/24/23 124 lb (56.2 kg)    GEN: Well nourished, well developed in no acute distress NECK: No JVD; No carotid bruits CARDIAC: ***RRR, no murmurs, rubs, gallops RESPIRATORY:  Clear to auscultation without rales, wheezing or rhonchi  ABDOMEN: Soft, non-tender, non-distended EXTREMITIES:  No edema; No deformity   ASSESSMENT AND PLAN: .    PAT (paroxysmal atrial tachycardia) (HCC)   Coronary artery disease involving native coronary artery of native heart without angina pectoris remote MI 2010  HTN  HLD     {Are you ordering a CV Procedure (e.g. stress test, cath, DCCV, TEE, etc)?   Press F2        :[id number]}  Dispo: ***  Signed, Theotis Flake, PA-C

## 2024-04-13 ENCOUNTER — Ambulatory Visit: Admitting: Physician Assistant

## 2024-06-17 ENCOUNTER — Encounter: Payer: Self-pay | Admitting: Internal Medicine

## 2024-06-17 ENCOUNTER — Non-Acute Institutional Stay: Payer: Self-pay | Admitting: Internal Medicine

## 2024-06-17 DIAGNOSIS — G20A1 Parkinson's disease without dyskinesia, without mention of fluctuations: Secondary | ICD-10-CM

## 2024-06-17 DIAGNOSIS — E782 Mixed hyperlipidemia: Secondary | ICD-10-CM | POA: Diagnosis not present

## 2024-06-17 DIAGNOSIS — Z66 Do not resuscitate: Secondary | ICD-10-CM | POA: Diagnosis not present

## 2024-06-17 DIAGNOSIS — M79661 Pain in right lower leg: Secondary | ICD-10-CM

## 2024-06-17 DIAGNOSIS — I471 Supraventricular tachycardia, unspecified: Secondary | ICD-10-CM | POA: Diagnosis not present

## 2024-06-17 DIAGNOSIS — N1831 Chronic kidney disease, stage 3a: Secondary | ICD-10-CM

## 2024-06-17 DIAGNOSIS — M79662 Pain in left lower leg: Secondary | ICD-10-CM

## 2024-06-17 DIAGNOSIS — I10 Essential (primary) hypertension: Secondary | ICD-10-CM

## 2024-06-17 NOTE — Progress Notes (Signed)
 Location:  [facility] Room Number: AL25-A Place of Service:  ALF [phone]) Provider:  Charlanne Fredia CROME, MD  Patient Care Team: Charlanne Fredia CROME, MD as PCP - General (Internal Medicine) Wonda Sharper, MD as PCP - Cardiology (Cardiology) Signa Rush, MD (Inactive) (Internal Medicine) Tat, Asberry RAMAN, DO as Consulting Physician (Neurology)  Extended Emergency Contact Information Primary Emergency Contact: Ezzard Shuck Home Phone: [phone] Mobile Phone: [phone] Relation: Daughter  Code Status:  DNR Goals of care: Advanced Directive information    06/17/2024   10:27 AM  Advanced Directives  Does Patient Have a Medical Advance Directive? Yes  Type of Advance Directive Out of facility DNR (pink MOST or yellow form)  Does patient want to make changes to medical advance directive? No - Patient declined     Chief Complaint  Patient presents with   Medical Management of Chronic Issues    Routine visit    HPI:  Pt is a 89 y.o. female seen today for medical management of chronic diseases.   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   H/o PSVT  On Metoprolol     GERD, HLD,HTN, CKD ,stage 3 a and Parkinson disease Cognitive impairment  Staying stable Uses Walker in the room and Power Chair outside [place] Readings from Last 3 Encounters:  06/17/24 114 lb 3.2 oz (51.8 kg)  03/31/24 120 lb 3.2 oz (54.5 kg)  02/23/24 113 lb (51.3 kg)    Has some pain in Legs but refuses Gabapentin Using Tylenol  No Falls No Behaviors Stays in her Room mostly  Past Medical History:  Diagnosis Date   Coronary artery disease    Methicillin resistant Staphylococcus aureus in conditions classified elsewhere and of unspecified site    MI (myocardial infarction) (HCC)    Other and unspecified hyperlipidemia    Unspecified essential hypertension    Past Surgical History:  Procedure  Laterality Date   CATARACT EXTRACTION, BILATERAL     CORONARY ANGIOPLASTY WITH STENT PLACEMENT     HIP ARTHROPLASTY Right 07/04/2022   Procedure: RIGHT ANTERIOR HIP REPLACEMENT ;  Surgeon: Vernetta Lonni GRADE, MD;  Location: [facility];  Service: Orthopedics;  Laterality: Right;    Allergies  Allergen Reactions   Norvasc [Amlodipine] Anaphylaxis, Swelling and Other (See Comments)    Edema   Altace [Ramipril] Cough   Fosamax [Alendronate] Other (See Comments)    Dysphagia    Outpatient Encounter Medications as of 06/17/2024  Medication Sig   acetaminophen  (TYLENOL ) 325 MG tablet Take 650 mg by mouth 3 (three) times daily.   aspirin  EC 81 MG tablet Take 81 mg by mouth daily. Swallow whole.   atorvastatin  (LIPITOR) 10 MG tablet Take 1 tablet (10 mg total) by mouth at bedtime for 5 days.   carbidopa -levodopa  (SINEMET  IR) 25-100 MG tablet Take 1 tablet at 7 AM ,Take 1 tablet at 11 Am ,and Take 1 tablet at 4 pm (Patient taking differently: Take 1 tablet by mouth daily.)   carbidopa -levodopa  (SINEMET  IR) 25-100 MG tablet Take 1 tablet by mouth in the morning, at noon, and at bedtime. (Patient taking differently: Take 1 tablet by mouth 2 (two) times daily. Take at 11AM and 4PM)   docusate sodium  (COLACE) 100 MG capsule Take 1 capsule (100 mg total) by mouth 2 (two) times daily.   ferrous sulfate  325 (65 FE) MG tablet Take 325 mg by mouth  3 (three) times a week. Monday, Wednesday, and Friday.   furosemide (LASIX) 20 MG tablet Take 20 mg by mouth daily as needed for fluid.   methocarbamol  (ROBAXIN ) 500 MG tablet Take 250 mg by mouth at bedtime as needed for muscle spasms. Avoid Norco use only for moderate pain.   metoprolol  tartrate (LOPRESSOR ) 25 MG tablet Take 1 tablet (25 mg total) by mouth 2 (two) times daily for 5 days.   mirtazapine  (REMERON ) 7.5 MG tablet Take 1 tablet (7.5 mg total) by mouth at bedtime.   nitroGLYCERIN  (NITROSTAT ) 0.4 MG SL tablet Place 1 tablet (0.4 mg total) under the  tongue every 5 (five) minutes as needed for chest pain.   pantoprazole  (PROTONIX ) 20 MG tablet Take 1 tablet (20 mg total) by mouth daily for 5 days.   zinc  oxide 20 % ointment Apply 1 Application topically as needed for irritation.   [DISCONTINUED] polyethylene glycol (MIRALAX  / GLYCOLAX ) 17 g packet Take 17 g by mouth daily. (Patient taking differently: Take 17 g by mouth every other day.)   polyethylene glycol (MIRALAX  / GLYCOLAX ) 17 g packet Take 17 g by mouth every other day.   [DISCONTINUED] acetaminophen  (TYLENOL ) 500 MG tablet Take 2 tablets (1,000 mg total) by mouth at bedtime. (Patient not taking: Reported on 06/17/2024)   No facility-administered encounter medications on file as of 06/17/2024.    Review of Systems  Constitutional:  Negative for activity change and appetite change.  HENT: Negative.    Respiratory:  Negative for cough and shortness of breath.   Cardiovascular:  Negative for leg swelling.  Gastrointestinal:  Negative for constipation.  Genitourinary: Negative.   Musculoskeletal:  Positive for gait problem. Negative for arthralgias and myalgias.  Skin: Negative.   Neurological:  Negative for dizziness and weakness.  Psychiatric/Behavioral:  Negative for confusion, dysphoric mood and sleep disturbance.     Immunization History  Administered Date(s) Administered   Fluad Quad(high Dose 65+) 10/02/2022   Fluad Trivalent(High Dose 65+) 10/07/2023   Fluzone Influenza virus vaccine,trivalent (IIV3), split virus 09/08/2013, 09/09/2015, 10/21/2016, 09/22/2019, 09/13/2021   Influenza Split 09/24/2010, 09/04/2011, 09/18/2012   Influenza-Unspecified 11/09/2014, 09/30/2015, 09/17/2016, 09/01/2017, 09/10/2018   Moderna Covid-19 Vaccine Bivalent Booster 43yrs & up 05/22/2022   Moderna Sars-Covid-2 Vaccination 12/13/2019, 01/09/2021, 05/16/2021   PFIZER(Purple Top)SARS-COV-2 Vaccination 01/10/2020, 10/15/2022   Pfizer Covid-19 Vaccine Bivalent Booster 8yrs & up 08/30/2021    Pneumococcal Conjugate-13 04/18/2014   Pneumococcal Polysaccharide-23 09/20/2002, 09/23/2019   Td 10/11/2004   Tdap 06/14/2016   Zoster Recombinant(Shingrix) 02/10/2019, 05/04/2019   Zoster, Live 01/23/2009   Pertinent  Health Maintenance Due  Topic Date Due   INFLUENZA VACCINE  07/09/2024   DEXA SCAN  Completed      07/24/2022   10:01 AM 09/06/2022   10:30 AM 10/25/2022   12:09 PM 09/12/2023   10:27 AM 10/08/2023   11:34 AM  Fall Risk  Falls in the past year?  1 1 0 0  Was there an injury with Fall?  1 1 0 0  Fall Risk Category Calculator  3 3 0 0  Fall Risk Category (Retired)  [employer]     (RETIRED) Patient Fall Risk Level Low fall risk  High fall risk  High fall risk     Patient at Risk for Falls Due to  History of fall(s);Impaired balance/gait;Impaired mobility History of fall(s) History of fall(s);Impaired balance/gait;Impaired mobility History of fall(s);Impaired balance/gait;Impaired mobility  Fall risk Follow up  Falls evaluation completed;Education provided;Falls prevention discussed  Falls evaluation completed  Falls evaluation completed;Education provided;Falls prevention discussed Falls evaluation completed;Education provided;Falls prevention discussed     Data saved with a previous flowsheet row definition   Functional Status Survey:    Vitals:   06/17/24 0949  BP: 138/76  Pulse: (!) 53  Temp: (!) 97.5 F (36.4 C)  SpO2: 99%  Weight: 114 lb 3.2 oz (51.8 kg)  Height: 5' 6 (1.676 m)   Body mass index is 18.43 kg/m. Physical Exam Vitals reviewed.  Constitutional:      Appearance: Normal appearance.  HENT:     Head: Normocephalic.     Nose: Nose normal.     Mouth/Throat:     Mouth: Mucous membranes are moist.     Pharynx: Oropharynx is clear.  Eyes:     Pupils: Pupils are equal, round, and reactive to light.  Cardiovascular:     Rate and Rhythm: Normal rate and regular rhythm.     Pulses: Normal pulses.     Heart sounds: Normal heart sounds. No  murmur heard. Pulmonary:     Effort: Pulmonary effort is normal.     Breath sounds: Normal breath sounds.  Abdominal:     General: Abdomen is flat. Bowel sounds are normal.     Palpations: Abdomen is soft.  Musculoskeletal:        General: No swelling.     Cervical back: Neck supple.  Skin:    General: Skin is warm.  Neurological:     General: No focal deficit present.     Mental Status: She is alert and oriented to person, place, and time.  Psychiatric:        Mood and Affect: Mood normal.        Thought Content: Thought content normal.     Labs reviewed: Recent Labs    02/19/24 0000  NA 143  K 4.8  CL 109*  CO2 27*  BUN 34*  CREATININE 1.3*  CALCIUM  8.8   No results for input(s): AST, ALT, ALKPHOS, BILITOT, PROT, ALBUMIN in the last 8760 hours. Recent Labs    02/19/24 0000  WBC 5.5  HGB 12.8  HCT 39  PLT 252   Lab Results  Component Value Date   TSH 0.434 07/23/2022   No results found for: HGBA1C Lab Results  Component Value Date   CHOL 142 01/06/2023   HDL 46 01/06/2023   LDLCALC 76 01/06/2023   TRIG 121 01/06/2023   CHOLHDL 3 09/22/2009    Significant Diagnostic Results in last 30 days:  No results found.  Assessment/Plan 1. DNR (do not resuscitate) (Primary)  - Do not attempt resuscitation (DNR)  2. Pain in both lower legs Tylenol  Refuses Gabapentin  3. Mixed hyperlipidemia Statin LDL 84 in 8/24  4. SVT (supraventricular tachycardia) (HCC) On Metoprolol   5. Essential hypertension Metoprolol   6. Stage 3a chronic kidney disease (HCC) Slight worsening  7. Parkinson's disease without dyskinesia or fluctuating manifestations (HCC) On Sinemet  Has not seen Neurology  Will d/w family if they want follow up 8 Depression Remeron  9 Anemia Discontinue Iron Hgb normal  10 GERD Protonix   Family/ staff Communication:   Labs/tests ordered:

## 2024-06-18 ENCOUNTER — Encounter: Payer: Self-pay | Admitting: Internal Medicine

## 2024-06-18 MED ORDER — POLYETHYLENE GLYCOL 3350 17 G PO PACK
17.0000 g | PACK | ORAL | Status: AC
Start: 2024-06-18 — End: ?

## 2024-07-13 ENCOUNTER — Encounter: Payer: Self-pay | Admitting: Neurology

## 2024-07-22 ENCOUNTER — Encounter: Payer: Self-pay | Admitting: Internal Medicine

## 2024-07-22 ENCOUNTER — Non-Acute Institutional Stay: Payer: Self-pay | Admitting: Internal Medicine

## 2024-07-22 DIAGNOSIS — I10 Essential (primary) hypertension: Secondary | ICD-10-CM | POA: Diagnosis not present

## 2024-07-22 DIAGNOSIS — F331 Major depressive disorder, recurrent, moderate: Secondary | ICD-10-CM

## 2024-07-22 DIAGNOSIS — E782 Mixed hyperlipidemia: Secondary | ICD-10-CM

## 2024-07-22 DIAGNOSIS — G20A1 Parkinson's disease without dyskinesia, without mention of fluctuations: Secondary | ICD-10-CM

## 2024-07-22 DIAGNOSIS — R630 Anorexia: Secondary | ICD-10-CM

## 2024-07-22 DIAGNOSIS — R600 Localized edema: Secondary | ICD-10-CM | POA: Diagnosis not present

## 2024-07-22 DIAGNOSIS — R001 Bradycardia, unspecified: Secondary | ICD-10-CM

## 2024-07-22 NOTE — Progress Notes (Signed)
 Location:  [facility] Room Number: AL25-A Place of Service:  ALF [phone]) Provider:  Charlanne Fredia CROME, MD  Patient Care Team: Charlanne Fredia CROME, MD as PCP - General (Internal Medicine) Wonda Sharper, MD as PCP - Cardiology (Cardiology) Signa Rush, MD (Inactive) (Internal Medicine) Tat, Asberry RAMAN, DO as Consulting Physician (Neurology)  Extended Emergency Contact Information Primary Emergency Contact: Ezzard Shuck Home Phone: [phone] Mobile Phone: [phone] Relation: Daughter  Code Status:  DNR Goals of care: Advanced Directive information    06/17/2024   10:27 AM  Advanced Directives  Does Patient Have a Medical Advance Directive? Yes  Type of Advance Directive Out of facility DNR (pink MOST or yellow form)  Does patient want to make changes to medical advance directive? No - Patient declined     Chief Complaint  Patient presents with   Edema    HPI:  Pt is a 89 y.o. 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  She did have order for Lasix as needed and has already gotten 3 doses.  Patient also complained of some shortness of breath. Patient has also been noticed to have bradycardia with heart rate in 40s. I had called her daughter to discuss her situation and the daughter was also concerned that patient is depressed and having worsening in her cognition  Past Medical History:  Diagnosis Date   Coronary artery disease    Methicillin resistant Staphylococcus aureus in conditions classified elsewhere and of unspecified site    MI (myocardial infarction) (HCC)    Other and unspecified  hyperlipidemia    Unspecified essential hypertension    Past Surgical History:  Procedure Laterality Date   CATARACT EXTRACTION, BILATERAL     CORONARY ANGIOPLASTY WITH STENT PLACEMENT     HIP ARTHROPLASTY Right 07/04/2022   Procedure: RIGHT ANTERIOR HIP REPLACEMENT ;  Surgeon: Vernetta Lonni GRADE, MD;  Location: [facility];  Service: Orthopedics;  Laterality: Right;    Allergies  Allergen Reactions   Norvasc [Amlodipine] Anaphylaxis, Swelling and Other (See Comments)    Edema   Altace [Ramipril] Cough   Fosamax [Alendronate] Other (See Comments)    Dysphagia    Outpatient Encounter Medications as of 07/22/2024  Medication Sig   acetaminophen  (TYLENOL ) 325 MG tablet Take 650 mg by mouth 3 (three) times daily.   aspirin  EC 81 MG tablet Take 81 mg by mouth daily. Swallow whole.   atorvastatin  (LIPITOR) 10 MG tablet Take 1 tablet (10 mg total) by mouth at bedtime for 5 days.   carbidopa -levodopa  (SINEMET  IR) 25-100 MG tablet Take 1 tablet at 7 AM ,Take 1 tablet at 11 Am ,and Take 1 tablet at 4 pm (Patient taking differently: Take 1 tablet by mouth daily.)   carbidopa -levodopa  (SINEMET  IR) 25-100 MG tablet Take 1 tablet by mouth in the morning, at noon, and at bedtime. (Patient taking differently: Take 1 tablet by mouth 2 (two) times daily. Take at 11AM and 4PM)   docusate sodium  (COLACE) 100 MG capsule Take 1 capsule (100 mg total) by mouth 2 (two) times daily.   metoprolol  tartrate (LOPRESSOR ) 25 MG  tablet Take 1 tablet (25 mg total) by mouth 2 (two) times daily for 5 days.   mirtazapine  (REMERON ) 7.5 MG tablet Take 1 tablet (7.5 mg total) by mouth at bedtime.   pantoprazole  (PROTONIX ) 20 MG tablet Take 1 tablet (20 mg total) by mouth daily for 5 days.   polyethylene glycol (MIRALAX  / GLYCOLAX ) 17 g packet Take 17 g by mouth every other day.   ferrous sulfate  325 (65 FE) MG tablet Take 325 mg by mouth 3 (three) times a week. Monday, Wednesday, and Friday. (Patient not taking: Reported on  07/22/2024)   furosemide (LASIX) 20 MG tablet Take 20 mg by mouth daily as needed for fluid.   methocarbamol  (ROBAXIN ) 500 MG tablet Take 250 mg by mouth at bedtime as needed for muscle spasms. Avoid Norco use only for moderate pain.   nitroGLYCERIN  (NITROSTAT ) 0.4 MG SL tablet Place 1 tablet (0.4 mg total) under the tongue every 5 (five) minutes as needed for chest pain.   zinc  oxide 20 % ointment Apply 1 Application topically as needed for irritation.   No facility-administered encounter medications on file as of 07/22/2024.    Review of Systems  Constitutional:  Positive for appetite change. Negative for activity change.  HENT: Negative.    Respiratory:  Positive for shortness of breath. Negative for cough.   Cardiovascular:  Positive for leg swelling.  Gastrointestinal:  Negative for constipation.  Genitourinary: Negative.   Musculoskeletal:  Positive for gait problem. Negative for arthralgias and myalgias.  Skin: Negative.   Neurological:  Negative for dizziness and weakness.  Psychiatric/Behavioral:  Positive for confusion and dysphoric mood. Negative for sleep disturbance.     Immunization History  Administered Date(s) Administered   Fluad Quad(high Dose 65+) 10/02/2022   Fluad Trivalent(High Dose 65+) 10/07/2023   Fluzone Influenza virus vaccine,trivalent (IIV3), split virus 09/08/2013, 09/09/2015, 10/21/2016, 09/22/2019, 09/13/2021   Influenza Split 09/24/2010, 09/04/2011, 09/18/2012   Influenza-Unspecified 11/09/2014, 09/30/2015, 09/17/2016, 09/01/2017, 09/10/2018   Moderna Covid-19 Vaccine Bivalent Booster 27yrs & up 05/22/2022   Moderna Sars-Covid-2 Vaccination 12/13/2019, 01/09/2021, 05/16/2021   PFIZER(Purple Top)SARS-COV-2 Vaccination 01/10/2020, 10/15/2022   Pfizer Covid-19 Vaccine Bivalent Booster 68yrs & up 08/30/2021   Pneumococcal Conjugate-13 04/18/2014   Pneumococcal Polysaccharide-23 09/20/2002, 09/23/2019   Td 10/11/2004   Tdap 06/14/2016   Zoster  Recombinant(Shingrix) 02/10/2019, 05/04/2019   Zoster, Live 01/23/2009   Pertinent  Health Maintenance Due  Topic Date Due   INFLUENZA VACCINE  07/09/2024   DEXA SCAN  Completed      07/24/2022   10:01 AM 09/06/2022   10:30 AM 10/25/2022   12:09 PM 09/12/2023   10:27 AM 10/08/2023   11:34 AM  Fall Risk  Falls in the past year?  1 1 0 0  Was there an injury with Fall?  1 1 0 0  Fall Risk Category Calculator  3 3 0 0  Fall Risk Category (Retired)  [employer]     (RETIRED) Patient Fall Risk Level Low fall risk  High fall risk  High fall risk     Patient at Risk for Falls Due to  History of fall(s);Impaired balance/gait;Impaired mobility History of fall(s) History of fall(s);Impaired balance/gait;Impaired mobility History of fall(s);Impaired balance/gait;Impaired mobility  Fall risk Follow up  Falls evaluation completed;Education provided;Falls prevention discussed  Falls evaluation completed  Falls evaluation completed;Education provided;Falls prevention discussed Falls evaluation completed;Education provided;Falls prevention discussed     Data saved with a previous flowsheet row definition   Functional Status Survey:  Vitals:   07/22/24 1128 07/22/24 1129  BP: (!) 113/56 108/68  Pulse: (!) 46   Temp: 98.1 F (36.7 C)   SpO2: 97%   Weight: 113 lb 9.6 oz (51.5 kg)   Height: 5' 6 (1.676 m)    Body mass index is 18.34 kg/m. Physical Exam Vitals reviewed.  Constitutional:      Appearance: Normal appearance.  HENT:     Head: Normocephalic.     Nose: Nose normal.     Mouth/Throat:     Mouth: Mucous membranes are moist.     Pharynx: Oropharynx is clear.  Eyes:     Pupils: Pupils are equal, round, and reactive to light.  Cardiovascular:     Rate and Rhythm: Regular rhythm. Bradycardia present.     Pulses: Normal pulses.     Heart sounds: Normal heart sounds. No murmur heard. Pulmonary:     Effort: Pulmonary effort is normal.     Breath sounds: Normal breath sounds. No  wheezing or rales.  Abdominal:     General: Abdomen is flat. Bowel sounds are normal.     Palpations: Abdomen is soft.  Musculoskeletal:        General: Swelling present.     Cervical back: Neck supple.     Comments: Swelling was more around her ankles and both legs  Skin:    General: Skin is warm.  Neurological:     General: No focal deficit present.     Mental Status: She is alert and oriented to person, place, and time.  Psychiatric:        Mood and Affect: Mood normal.        Thought Content: Thought content normal.     Labs reviewed: Recent Labs    02/19/24 0000  NA 143  K 4.8  CL 109*  CO2 27*  BUN 34*  CREATININE 1.3*  CALCIUM  8.8   No results for input(s): AST, ALT, ALKPHOS, BILITOT, PROT, ALBUMIN in the last 8760 hours. Recent Labs    02/19/24 0000  WBC 5.5  HGB 12.8  HCT 39  PLT 252   Lab Results  Component Value Date   TSH 0.434 07/23/2022   No results found for: HGBA1C Lab Results  Component Value Date   CHOL 142 01/06/2023   HDL 46 01/06/2023   LDLCALC 76 01/06/2023   TRIG 121 01/06/2023   CHOLHDL 3 09/22/2009    Significant Diagnostic Results in last 30 days:  No results found.  Assessment/Plan 1. Bradycardia (Primary) Hold Metoprolol  EKG ordered  2. Bilateral leg edema Lasix 20 mg every day for 7 days and then Reval Also ordered Dopplers due to her previous history of DVT  3. Poor appetite On Remeron  Weight stable Per daughter patient does not like the food here and friends found that she usually has a good appetite when they take her outside. 4. Essential hypertension Will watch BP off metoprolol  Can add some other Med to help her BP   5. Parkinson's disease without dyskinesia or fluctuating manifestations (HCC) On Sinemet  Has appointment with Neurology  7. Moderate episode of recurrent major depressive disorder (HCC) Discussed in detail with her daughter.  Her daughter does not want to aggressive treatment  for Ms. Armas.  Her focus is more comfort.  She has also noticed worsening cognition.and apathy. After discussion we decided to start her on Wellbutrin  150 mg XL.  Continue Remeron  at night.    Family/ staff Communication: daughter   Labs/tests ordered:

## 2024-07-23 MED ORDER — BUPROPION HCL ER (XL) 150 MG PO TB24: 150.0000 mg | ORAL_TABLET | Freq: Every day | ORAL | Status: DC

## 2024-07-26 NOTE — Progress Notes (Unsigned)
 Assessment/Plan:   1.  Parkinsons Disease             -Increase carbidopa /levodopa  25/100, 1.5 tablet 3 times per day.               -she is very hypophonic and she needs ST.  RX printed  -encouraged PT and working hard with PT  -discussed mental/physical exercises   2.  DVT and subsequent PE, post hip fracture             -Eliquis  has now been discontinued  3.  Sinus arrhthymia  -daughter making f/u today with cardiology  4.  Sialorrhea  -This is commonly associated with PD.  We talked about treatments.  The patient is not a candidate for oral anticholinergic therapy because of increased risk of confusion and falls.  We discussed Botox (type A and B) and 1% atropine drops.  We discusssed that candy like lemon drops can help by stimulating mm of the oropharynx to induce swallowing.  Patient is going to think about it.  They will let me know if they would like to proceed with looking at Xeomin   Subjective:   Taylor Mendoza was seen today in follow up for Parkinsons disease.  My previous records were reviewed prior to todays visit as well as outside records available to me.  Pt with daughters who supplements the history.  I have not seen the patient in a year and a half.  Medical records made available to me are reviewed.  She was last seen by her internist at [facility] August 14.  That was an acute visit for shortness of breath.  She was also noticed to have bradycardia in the 40s.  She was on metoprolol , so that was held.  She was also started on Wellbutrin  for mood and apathy.  Pt states that she didn't even know she was taking it but daughter thinks that it is just starting to help.  Doing PT 2 days a week but otherwise pretty sedentary.  Pt denies falls but daughter does state that she has falls 2 times per month - usually slides off of the bed.  She then got some grip socks and she hasn't fallen since.  No visual hallucinations.  Pt denies drooling at the beginning of the  visit (admits to at the end) but daughter states its significant.    Current prescribed movement disorder medications: Carbidopa /levodopa  25/100, 1 tablet 3 times per day     ALLERGIES:   Allergies  Allergen Reactions   Norvasc [Amlodipine] Anaphylaxis, Swelling and Other (See Comments)    Edema   Altace [Ramipril] Cough   Fosamax [Alendronate] Other (See Comments)    Dysphagia    CURRENT MEDICATIONS:  Outpatient Encounter Medications as of 07/27/2024  Medication Sig   acetaminophen  (TYLENOL ) 325 MG tablet Take 650 mg by mouth 3 (three) times daily.   aspirin  EC 81 MG tablet Take 81 mg by mouth daily. Swallow whole.   atorvastatin  (LIPITOR) 10 MG tablet Take 1 tablet (10 mg total) by mouth at bedtime for 5 days.   buPROPion  (WELLBUTRIN  XL) 150 MG 24 hr tablet Take 1 tablet (150 mg total) by mouth daily.   carbidopa -levodopa  (SINEMET  IR) 25-100 MG tablet Take 1 tablet by mouth in the morning, at noon, and at bedtime.   docusate sodium  (COLACE) 100 MG capsule Take 1 capsule (100 mg total) by mouth 2 (two) times daily.   furosemide (LASIX) 20 MG tablet  Take 20 mg by mouth daily as needed for fluid.   methocarbamol  (ROBAXIN ) 500 MG tablet Take 250 mg by mouth at bedtime as needed for muscle spasms. Avoid Norco use only for moderate pain.   mirtazapine  (REMERON ) 7.5 MG tablet Take 1 tablet (7.5 mg total) by mouth at bedtime.   nitroGLYCERIN  (NITROSTAT ) 0.4 MG SL tablet Place 1 tablet (0.4 mg total) under the tongue every 5 (five) minutes as needed for chest pain.   pantoprazole  (PROTONIX ) 20 MG tablet Take 1 tablet (20 mg total) by mouth daily for 5 days.   polyethylene glycol (MIRALAX  / GLYCOLAX ) 17 g packet Take 17 g by mouth every other day.   zinc  oxide 20 % ointment Apply 1 Application topically as needed for irritation.   [DISCONTINUED] carbidopa -levodopa  (SINEMET  IR) 25-100 MG tablet Take 1 tablet at 7 AM ,Take 1 tablet at 11 Am ,and Take 1 tablet at 4 pm (Patient taking  differently: Take 1 tablet by mouth daily.)   [DISCONTINUED] metoprolol  tartrate (LOPRESSOR ) 25 MG tablet Take 1 tablet (25 mg total) by mouth 2 (two) times daily for 5 days. (Patient not taking: Reported on 07/23/2024)   No facility-administered encounter medications on file as of 07/27/2024.    Objective:   PHYSICAL EXAMINATION:    VITALS:   Vitals:   07/27/24 1056  BP: 132/88  Pulse: (!) 58  SpO2: 98%  Weight: 110 lb 4.8 oz (50 kg)     Wt Readings from Last 3 Encounters:  07/27/24 110 lb 4.8 oz (50 kg)  07/22/24 113 lb 9.6 oz (51.5 kg)  06/17/24 114 lb 3.2 oz (51.8 kg)   GEN:  The patient appears stated age and is in NAD. HEENT:  Normocephalic, atraumatic.  The mucous membranes are moist. The superficial temporal arteries are without ropiness or tenderness. CV:  brady.  regular Lungs:  CTAB Neck/HEME:  There are no carotid bruits bilaterally.  Neurological examination:  Orientation: The patient is alert and oriented x3. Cranial nerves: There is good facial symmetry with min facial hypomimia. The speech is fluent and clear. Soft palate rises symmetrically and there is no tongue deviation. Hearing is intact to conversational tone. Sensation: Sensation is intact to light touch throughout Motor: Strength is at least antigravity x4.  Movement examination: Tone: There is normal tone today Abnormal movements: none Coordination:  There is mild decremation with toe taps on the R Gait and Station: Patient pushes off of the chair to arise.  The patient's stride length is decreased and the R leg she drags but this is more of a knee issue and she has a significant valgus knee deformity  Total time spent on today's visit was 40 minutes, including both face-to-face time and nonface-to-face time.  Time included that spent on review of records (prior notes available to me/labs/imaging if pertinent), discussing treatment and goals, answering patient's questions and coordinating care.     Cc:  Charlanne Fredia CROME, MD

## 2024-07-27 ENCOUNTER — Telehealth: Payer: Self-pay | Admitting: Cardiovascular Disease

## 2024-07-27 ENCOUNTER — Ambulatory Visit: Attending: Cardiovascular Disease

## 2024-07-27 ENCOUNTER — Ambulatory Visit (INDEPENDENT_AMBULATORY_CARE_PROVIDER_SITE_OTHER): Admitting: Neurology

## 2024-07-27 VITALS — BP 132/88 | HR 58 | Wt 110.3 lb

## 2024-07-27 DIAGNOSIS — R498 Other voice and resonance disorders: Secondary | ICD-10-CM | POA: Diagnosis not present

## 2024-07-27 DIAGNOSIS — I4719 Other supraventricular tachycardia: Secondary | ICD-10-CM

## 2024-07-27 DIAGNOSIS — G20A1 Parkinson's disease without dyskinesia, without mention of fluctuations: Secondary | ICD-10-CM | POA: Diagnosis not present

## 2024-07-27 MED ORDER — CARBIDOPA-LEVODOPA 25-100 MG PO TABS: ORAL_TABLET | ORAL | 1 refills | Status: AC

## 2024-07-27 NOTE — Telephone Encounter (Signed)
 According to patient's daughter the patient is experiencing an irregular hear rate - 80 to 40 beats per minute/ - Needs 1st available appointment to see Dr. Wonda ASAP.  [phone] - Please call patient's daughter Ronal Kerns to arrange for an appointment - I am booking her appointment on Dec 11th slot for now.  Thank you

## 2024-07-27 NOTE — Progress Notes (Unsigned)
 Enrolled patient for a 3 day Zio XT monitor to be mailed to patients home

## 2024-07-27 NOTE — Telephone Encounter (Signed)
 Thx - please order 3 day ZIO. OK to schedule visit after ZIO with either me or APP.

## 2024-07-27 NOTE — Telephone Encounter (Signed)
 Spoke with pt's daughter and went over Dr. Margurite recommendations of ordering a 3-day zio monitor. Pt's daughter agreed to plan and thought it was a great idea. Order for zio has been placed and will be sent to pt's assisted living facility (address on pt's chart verified with pt's daughter to make sure it was correct). Appt will be made once heart monitor is completed.

## 2024-07-27 NOTE — Telephone Encounter (Signed)
 Attempted to call pt's daughter to discuss a sooner appt but unable to reach. LMTCB.

## 2024-07-27 NOTE — Telephone Encounter (Signed)
 Patient's daughter is returning call.

## 2024-07-28 ENCOUNTER — Ambulatory Visit: Admitting: Neurology

## 2024-07-29 ENCOUNTER — Encounter: Payer: Self-pay | Admitting: Internal Medicine

## 2024-07-29 ENCOUNTER — Non-Acute Institutional Stay: Payer: Self-pay | Admitting: Internal Medicine

## 2024-07-29 DIAGNOSIS — K219 Gastro-esophageal reflux disease without esophagitis: Secondary | ICD-10-CM

## 2024-07-29 DIAGNOSIS — R1111 Vomiting without nausea: Secondary | ICD-10-CM | POA: Diagnosis not present

## 2024-07-29 DIAGNOSIS — I10 Essential (primary) hypertension: Secondary | ICD-10-CM

## 2024-07-29 DIAGNOSIS — R001 Bradycardia, unspecified: Secondary | ICD-10-CM | POA: Diagnosis not present

## 2024-07-29 DIAGNOSIS — R600 Localized edema: Secondary | ICD-10-CM | POA: Diagnosis not present

## 2024-07-29 DIAGNOSIS — G20A1 Parkinson's disease without dyskinesia, without mention of fluctuations: Secondary | ICD-10-CM

## 2024-07-29 NOTE — Progress Notes (Unsigned)
 Location:  [facility] Room Number: AL25-A Place of Service:  ALF [phone]) Provider:  Charlanne Fredia CROME, MD  Patient Care Team: Charlanne Fredia CROME, MD as PCP - General (Internal Medicine) Wonda Sharper, MD as PCP - Cardiology (Cardiology) Signa Rush, MD (Inactive) (Internal Medicine) Tat, Asberry RAMAN, DO as Consulting Physician (Neurology)  Extended Emergency Contact Information Primary Emergency Contact: [facility] Phone: [phone] Mobile Phone: [phone] Relation: Daughter  Code Status:  DNR Goals of care: Advanced Directive information    06/17/2024   10:27 AM  Advanced Directives  Does Patient Have a Medical Advance Directive? Yes  Type of Advance Directive Out of facility DNR (pink MOST or yellow form)  Does patient want to make changes to medical advance directive? No - Patient declined     Chief Complaint  Patient presents with   Nausea   Emesis    HPI:  Pt is a 89 y.o. female seen today for an acute visit for Nausea and Vomiting  Lives in [place] in [facility] Uses walker and Power Chair to ambulate   Patient was seen as she has had 2 episodes of Vomiting since yesterday.  She denies any nausea.  Denies any abdominal pain.  Her bowels are moving well. Later in the day patient was able to eat her breakfast but then had vomiting after her lunch. She did have a KUB done in the facility which did not show any obstruction or stool burden   Recently patient also had an episode of bradycardia with low blood pressure.  Her EKG showed sinus bradycardia.  I had discontinued her metoprolol  and we are following her blood pressure and her heart rate which has stayed below 60 and her blood pressure is in 100s.  Patient also saw Dr. Evonnie and her Sinemet  was increased to 1.5 tablets 3 times a day. I had also started patient on Wellbutrin  for her apathy and depression  She was also given increased dose of Lasix for few days due to lower extremity edema. Her  Dopplers done here were negative for any DVT   Patient Has h/o Right Leg DVT and PE after Right hip hemiarthroplasty  H/o PSVT    GERD, HLD,HTN, CKD ,stage 3 a and Parkinson disease Cognitive impairment   Past Medical History:  Diagnosis Date   Coronary artery disease    Methicillin resistant Staphylococcus aureus in conditions classified elsewhere and of unspecified site    MI (myocardial infarction) (HCC)    Other and unspecified hyperlipidemia    Unspecified essential hypertension    Past Surgical History:  Procedure Laterality Date   CATARACT EXTRACTION, BILATERAL     CORONARY ANGIOPLASTY WITH STENT PLACEMENT     HIP ARTHROPLASTY Right 07/04/2022   Procedure: RIGHT ANTERIOR HIP REPLACEMENT ;  Surgeon: Vernetta Lonni GRADE, MD;  Location: [facility];  Service: Orthopedics;  Laterality: Right;    Allergies  Allergen Reactions   Norvasc [Amlodipine] Anaphylaxis, Swelling and Other (See Comments)    Edema   Altace [Ramipril] Cough   Fosamax [Alendronate] Other (See Comments)    Dysphagia    Outpatient Encounter Medications as of 07/29/2024  Medication Sig   acetaminophen  (TYLENOL ) 325 MG tablet Take 650 mg by mouth 3 (three) times daily.   aspirin  EC 81 MG tablet Take 81 mg by mouth daily. Swallow whole.   atorvastatin  (LIPITOR) 10 MG tablet Take 1 tablet (10 mg total) by mouth at bedtime for 5 days.   buPROPion  (WELLBUTRIN  XL) 150 MG  24 hr tablet Take 1 tablet (150 mg total) by mouth daily.   carbidopa -levodopa  (SINEMET  IR) 25-100 MG tablet 1.5 tablets at 7am/11am/4pm   docusate sodium  (COLACE) 100 MG capsule Take 1 capsule (100 mg total) by mouth 2 (two) times daily.   magnesium hydroxide (MILK OF MAGNESIA) 400 MG/5ML suspension Take 30 mLs by mouth daily.   mirtazapine  (REMERON ) 7.5 MG tablet Take 1 tablet (7.5 mg total) by mouth at bedtime.   pantoprazole  (PROTONIX ) 20 MG tablet Take 1 tablet (20 mg total) by mouth daily for 5 days.   polyethylene glycol (MIRALAX  /  GLYCOLAX ) 17 g packet Take 17 g by mouth every other day.   furosemide (LASIX) 20 MG tablet Take 20 mg by mouth daily as needed for fluid.   methocarbamol  (ROBAXIN ) 500 MG tablet Take 250 mg by mouth at bedtime as needed for muscle spasms. Avoid Norco use only for moderate pain.   nitroGLYCERIN  (NITROSTAT ) 0.4 MG SL tablet Place 1 tablet (0.4 mg total) under the tongue every 5 (five) minutes as needed for chest pain.   ondansetron  (ZOFRAN ) 4 MG tablet Take 4 mg by mouth every 6 (six) hours as needed for nausea or vomiting.   zinc  oxide 20 % ointment Apply 1 Application topically as needed for irritation.   No facility-administered encounter medications on file as of 07/29/2024.    Review of Systems  Constitutional:  Positive for appetite change. Negative for activity change.  HENT: Negative.    Respiratory:  Negative for cough and shortness of breath.   Cardiovascular:  Negative for leg swelling.  Gastrointestinal:  Positive for vomiting. Negative for abdominal distention, abdominal pain and constipation.  Genitourinary: Negative.   Musculoskeletal:  Positive for gait problem. Negative for arthralgias and myalgias.  Skin: Negative.   Neurological:  Negative for dizziness and weakness.  Psychiatric/Behavioral:  Positive for confusion and dysphoric mood. Negative for sleep disturbance.     Immunization History  Administered Date(s) Administered   Fluad Quad(high Dose 65+) 10/02/2022   Fluad Trivalent(High Dose 65+) 10/07/2023   Fluzone Influenza virus vaccine,trivalent (IIV3), split virus 09/08/2013, 09/09/2015, 10/21/2016, 09/22/2019, 09/13/2021   Influenza Split 09/24/2010, 09/04/2011, 09/18/2012   Influenza-Unspecified 11/09/2014, 09/30/2015, 09/17/2016, 09/01/2017, 09/10/2018   Moderna Covid-19 Vaccine Bivalent Booster 9yrs & up 05/22/2022   Moderna Sars-Covid-2 Vaccination 12/13/2019, 01/09/2021, 05/16/2021   PFIZER(Purple Top)SARS-COV-2 Vaccination 01/10/2020, 10/15/2022   Pfizer  Covid-19 Vaccine Bivalent Booster 40yrs & up 08/30/2021   Pneumococcal Conjugate-13 04/18/2014   Pneumococcal Polysaccharide-23 09/20/2002, 09/23/2019   Td 10/11/2004   Tdap 06/14/2016   Zoster Recombinant(Shingrix) 02/10/2019, 05/04/2019   Zoster, Live 01/23/2009   Pertinent  Health Maintenance Due  Topic Date Due   INFLUENZA VACCINE  07/09/2024   DEXA SCAN  Completed      09/06/2022   10:30 AM 10/25/2022   12:09 PM 09/12/2023   10:27 AM 10/08/2023   11:34 AM 07/27/2024   10:57 AM  Fall Risk  Falls in the past year? 1 1 0 0 1  Was there an injury with Fall? 1 1 0 0 0  Fall Risk Category Calculator 3 3 0 0 2  Fall Risk Category (Retired) [employer]      (RETIRED) Patient Fall Risk Level High fall risk  High fall risk      Patient at Risk for Falls Due to History of fall(s);Impaired balance/gait;Impaired mobility History of fall(s) History of fall(s);Impaired balance/gait;Impaired mobility History of fall(s);Impaired balance/gait;Impaired mobility   Fall risk Follow up Falls evaluation  completed;Education provided;Falls prevention discussed  Falls evaluation completed  Falls evaluation completed;Education provided;Falls prevention discussed Falls evaluation completed;Education provided;Falls prevention discussed Falls evaluation completed     Data saved with a previous flowsheet row definition   Functional Status Survey:    Vitals:   07/29/24 1111  BP: 111/75  Pulse: (!) 51  SpO2: 96%  Weight: 113 lb 9.6 oz (51.5 kg)  Height: 5' 6 (1.676 m)   Body mass index is 18.34 kg/m. Physical Exam Vitals reviewed.  Constitutional:      Appearance: Normal appearance.  HENT:     Head: Normocephalic.     Nose: Nose normal.     Mouth/Throat:     Mouth: Mucous membranes are moist.     Pharynx: Oropharynx is clear.  Eyes:     Pupils: Pupils are equal, round, and reactive to light.  Cardiovascular:     Rate and Rhythm: Normal rate and regular rhythm.     Pulses: Normal pulses.      Heart sounds: Normal heart sounds. No murmur heard. Pulmonary:     Effort: Pulmonary effort is normal.     Breath sounds: Normal breath sounds.  Abdominal:     General: Abdomen is flat. Bowel sounds are normal. There is no distension.     Palpations: Abdomen is soft.     Tenderness: There is no abdominal tenderness. There is no guarding.  Musculoskeletal:        General: No swelling.     Cervical back: Neck supple.     Comments: Mild Edema  Skin:    General: Skin is warm.  Neurological:     General: No focal deficit present.     Mental Status: She is alert.  Psychiatric:        Mood and Affect: Mood normal.        Thought Content: Thought content normal.     Labs reviewed: Recent Labs    02/19/24 0000  NA 143  K 4.8  CL 109*  CO2 27*  BUN 34*  CREATININE 1.3*  CALCIUM  8.8   No results for input(s): AST, ALT, ALKPHOS, BILITOT, PROT, ALBUMIN in the last 8760 hours. Recent Labs    02/19/24 0000  WBC 5.5  HGB 12.8  HCT 39  PLT 252   Lab Results  Component Value Date   TSH 0.434 07/23/2022   No results found for: HGBA1C Lab Results  Component Value Date   CHOL 142 01/06/2023   HDL 46 01/06/2023   LDLCALC 76 01/06/2023   TRIG 121 01/06/2023   CHOLHDL 3 09/22/2009    Significant Diagnostic Results in last 30 days:  No results found.  Assessment/Plan 1. Vomiting without nausea, unspecified vomiting type (Primary) Seems like Reflux But she was recently started on Wellbutrin  and Also Sinemet  was changed to higher dose  Will Discontinue Wellbutrin  for now  Can try again Let her use to Sinemet  dose Also Use Zofran  Prn Speech Eval was also written to eval her Swallowing  Protonix  Changed to 40 mg BID Will Follow   2. Gastroesophageal reflux disease without esophagitis Change Protonix  to 40 mg BID   3. Bradycardia HR was running in 40s with Low BP Continue to monitor her HR and BP here EKG showed Sinus Bradycardia I see note in the  Epic that Cardiology Planning to do Zio patch  4. Bilateral leg edema Responded to Lasix Now on PRN dose  5. Essential hypertension BP is good off metorpolol  6. Parkinson's disease  without dyskinesia or fluctuating manifestations (HCC) On Sinemet  Dose changed recently  Labs CBC,CMP and Magnesium     Family/ staff Communication:   Labs/tests ordered:

## 2024-07-30 MED ORDER — PANTOPRAZOLE SODIUM 40 MG PO TBEC: 40.0000 mg | DELAYED_RELEASE_TABLET | Freq: Two times a day (BID) | ORAL | Status: AC

## 2024-08-02 LAB — COMPREHENSIVE METABOLIC PANEL WITH GFR
Albumin: 3.8 (ref 3.5–5.0)
Calcium: 8.8 (ref 8.7–10.7)
Globulin: 2.2
eGFR: 38

## 2024-08-02 LAB — BASIC METABOLIC PANEL WITH GFR
BUN: 37 — AB (ref 4–21)
CO2: 28 — AB (ref 13–22)
Chloride: 107 (ref 99–108)
Creatinine: 1.3 — AB (ref 0.5–1.1)
Glucose: 74
Potassium: 4.2 meq/L (ref 3.5–5.1)
Sodium: 144 (ref 137–147)

## 2024-08-02 LAB — LIPID PANEL
Cholesterol: 143 (ref 0–200)
HDL: 52 (ref 35–70)
LDL Cholesterol: 70
LDl/HDL Ratio: 2.8
Triglycerides: 125 (ref 40–160)

## 2024-08-02 LAB — HEPATIC FUNCTION PANEL
ALT: 4 U/L — AB (ref 7–35)
AST: 14 (ref 13–35)
Alkaline Phosphatase: 86 (ref 25–125)
Bilirubin, Total: 0.6

## 2024-08-02 LAB — TSH: TSH: 0.78 (ref 0.41–5.90)

## 2024-08-02 LAB — CBC AND DIFFERENTIAL
HCT: 40 (ref 36–46)
Hemoglobin: 13.1 (ref 12.0–16.0)
Neutrophils Absolute: 3373
Platelets: 236 K/uL (ref 150–400)
WBC: 6.2

## 2024-08-02 LAB — CBC: RBC: 4.25 (ref 3.87–5.11)

## 2024-09-01 ENCOUNTER — Encounter: Payer: Self-pay | Admitting: Orthopedic Surgery

## 2024-09-01 ENCOUNTER — Non-Acute Institutional Stay: Payer: Self-pay | Admitting: Orthopedic Surgery

## 2024-09-01 DIAGNOSIS — K219 Gastro-esophageal reflux disease without esophagitis: Secondary | ICD-10-CM | POA: Diagnosis not present

## 2024-09-01 DIAGNOSIS — R1111 Vomiting without nausea: Secondary | ICD-10-CM

## 2024-09-01 NOTE — Progress Notes (Signed)
 Location:  [facility] Room Number: AL25-A Place of Service:  ALF [phone]) Provider: Gil Greig BRAVO, NP   Charlanne Fredia CROME, MD  Patient Care Team: Charlanne Fredia CROME, MD as PCP - General (Internal Medicine) Wonda Sharper, MD as PCP - Cardiology (Cardiology) Signa Rush, MD (Inactive) (Internal Medicine) Tat, Asberry RAMAN, DO as Consulting Physician (Neurology)  Extended Emergency Contact Information Primary Emergency Contact: [facility] Phone: [phone] Mobile Phone: [phone] Relation: Daughter  Code Status:  DNR Goals of care: Advanced Directive information    06/17/2024   10:27 AM  Advanced Directives  Does Patient Have a Medical Advance Directive? Yes  Type of Advance Directive Out of facility DNR (pink MOST or yellow form)  Does patient want to make changes to medical advance directive? No - Patient declined     Chief Complaint  Patient presents with   Emesis    HPI:  Pt is a 89 y.o. female seen today for acute visit due to vomiting.   She currently resides on the assisted living unit at [facility]. PMH: atrial fibrillation, MI, acute PE, DVT, HTN, HLD, GERD, constipation, parkinson's, CKD, right hemiarthroplasty 07/03/2022, and unstable gait.   She has had 2 episodes of vomiting in past 24 hours. Denies heartburn, nausea, low back pain or abdominal pain. Cannot remember last normal bowel movement. She is receiving miralax  every other day. 08/21 she had similar symptoms. KUB was unremarkable. Wellbutrin  was discontinued. Increased sinemet  was continued. Protonix  was increased to 40 mg BID. Currently working with ST, no changes in diet. See recent labs below. Afebrile. Vitals stable.   Treatment options discussed. Refused KUB.   08/02/2024: WBC 6.2, BUN/creat 37/1.32, electrolytes normal, magnesium 2.3, alk phos 86, bilirubin 0.6, TSH 0.78.    Past Medical History:  Diagnosis Date   Coronary artery disease    Methicillin resistant  Staphylococcus aureus in conditions classified elsewhere and of unspecified site    MI (myocardial infarction) (HCC)    Other and unspecified hyperlipidemia    Unspecified essential hypertension    Past Surgical History:  Procedure Laterality Date   CATARACT EXTRACTION, BILATERAL     CORONARY ANGIOPLASTY WITH STENT PLACEMENT     HIP ARTHROPLASTY Right 07/04/2022   Procedure: RIGHT ANTERIOR HIP REPLACEMENT ;  Surgeon: Vernetta Lonni GRADE, MD;  Location: [facility];  Service: Orthopedics;  Laterality: Right;    Allergies  Allergen Reactions   Norvasc [Amlodipine] Anaphylaxis, Swelling and Other (See Comments)    Edema   Altace [Ramipril] Cough   Fosamax [Alendronate] Other (See Comments)    Dysphagia    Outpatient Encounter Medications as of 09/01/2024  Medication Sig   acetaminophen  (TYLENOL ) 325 MG tablet Take 650 mg by mouth 3 (three) times daily.   aspirin  EC 81 MG tablet Take 81 mg by mouth daily. Swallow whole.   atorvastatin  (LIPITOR) 10 MG tablet Take 1 tablet (10 mg total) by mouth at bedtime for 5 days.   carbidopa -levodopa  (SINEMET  IR) 25-100 MG tablet 1.5 tablets at 7am/11am/4pm   docusate sodium  (COLACE) 100 MG capsule Take 1 capsule (100 mg total) by mouth 2 (two) times daily.   furosemide (LASIX) 20 MG tablet Take 20 mg by mouth daily as needed for fluid.   mirtazapine  (REMERON ) 7.5 MG tablet Take 1 tablet (7.5 mg total) by mouth at bedtime.   nitroGLYCERIN  (NITROSTAT ) 0.4 MG SL tablet Place 1 tablet (0.4 mg total) under the tongue every 5 (five) minutes as needed for chest pain.  pantoprazole  (PROTONIX ) 40 MG tablet Take 1 tablet (40 mg total) by mouth 2 (two) times daily.   polyethylene glycol (MIRALAX  / GLYCOLAX ) 17 g packet Take 17 g by mouth every other day.   zinc  oxide 20 % ointment Apply 1 Application topically as needed for irritation.   buPROPion  (WELLBUTRIN  XL) 150 MG 24 hr tablet Take 1 tablet (150 mg total) by mouth daily. (Patient not taking: Reported on  09/01/2024)   magnesium hydroxide (MILK OF MAGNESIA) 400 MG/5ML suspension Take 30 mLs by mouth daily. (Patient not taking: Reported on 09/01/2024)   methocarbamol  (ROBAXIN ) 500 MG tablet Take 250 mg by mouth at bedtime as needed for muscle spasms. Avoid Norco use only for moderate pain. (Patient not taking: Reported on 09/01/2024)   ondansetron  (ZOFRAN ) 4 MG tablet Take 4 mg by mouth every 6 (six) hours as needed for nausea or vomiting. (Patient not taking: Reported on 09/01/2024)   No facility-administered encounter medications on file as of 09/01/2024.    Review of Systems  Constitutional:  Negative for fatigue and fever.  HENT:  Negative for sore throat and trouble swallowing.   Eyes:  Negative for visual disturbance.  Respiratory:  Negative for cough and shortness of breath.   Cardiovascular:  Positive for leg swelling. Negative for chest pain.  Gastrointestinal:  Positive for abdominal distention, constipation and vomiting. Negative for abdominal pain, diarrhea and nausea.  Genitourinary:  Negative for dysuria and hematuria.  Musculoskeletal:  Positive for gait problem. Negative for back pain.  Skin:  Negative for wound.  Neurological:  Positive for weakness.  Psychiatric/Behavioral:  Negative for dysphoric mood. The patient is not nervous/anxious.     Immunization History  Administered Date(s) Administered   Fluad Quad(high Dose 65+) 10/02/2022   Fluad Trivalent(High Dose 65+) 10/07/2023   Fluzone Influenza virus vaccine,trivalent (IIV3), split virus 09/08/2013, 09/09/2015, 10/21/2016, 09/22/2019, 09/13/2021   Influenza Split 09/24/2010, 09/04/2011, 09/18/2012   Influenza-Unspecified 11/09/2014, 09/30/2015, 09/17/2016, 09/01/2017, 09/10/2018   Moderna Covid-19 Vaccine Bivalent Booster 30yrs & up 05/22/2022   Moderna Sars-Covid-2 Vaccination 12/13/2019, 01/09/2021, 05/16/2021   PFIZER(Purple Top)SARS-COV-2 Vaccination 01/10/2020, 10/15/2022   Pfizer Covid-19 Vaccine Bivalent Booster  2yrs & up 08/30/2021   Pneumococcal Conjugate-13 04/18/2014   Pneumococcal Polysaccharide-23 09/20/2002, 09/23/2019   Td 10/11/2004   Tdap 06/14/2016   Zoster Recombinant(Shingrix) 02/10/2019, 05/04/2019   Zoster, Live 01/23/2009   Pertinent  Health Maintenance Due  Topic Date Due   Influenza Vaccine  07/09/2024   DEXA SCAN  Completed      09/06/2022   10:30 AM 10/25/2022   12:09 PM 09/12/2023   10:27 AM 10/08/2023   11:34 AM 07/27/2024   10:57 AM  Fall Risk  Falls in the past year? 1 1 0 0 1  Was there an injury with Fall? 1 1 0 0 0  Fall Risk Category Calculator 3 3 0 0 2  Fall Risk Category (Retired) [employer]      (RETIRED) Patient Fall Risk Level High fall risk  High fall risk      Patient at Risk for Falls Due to History of fall(s);Impaired balance/gait;Impaired mobility History of fall(s) History of fall(s);Impaired balance/gait;Impaired mobility History of fall(s);Impaired balance/gait;Impaired mobility   Fall risk Follow up Falls evaluation completed;Education provided;Falls prevention discussed  Falls evaluation completed  Falls evaluation completed;Education provided;Falls prevention discussed Falls evaluation completed;Education provided;Falls prevention discussed Falls evaluation completed     Data saved with a previous flowsheet row definition   Functional Status Survey:  Vitals:   09/01/24 1132  BP: 108/63  Pulse: (!) 58  Resp: 19  Temp: 97.7 F (36.5 C)  SpO2: 94%  Weight: 112 lb (50.8 kg)  Height: 5' 6 (1.676 m)   Body mass index is 18.08 kg/m. Physical Exam Vitals reviewed.  Constitutional:      General: She is not in acute distress. HENT:     Head: Normocephalic.  Eyes:     General:        Right eye: No discharge.        Left eye: No discharge.  Cardiovascular:     Rate and Rhythm: Normal rate and regular rhythm.     Pulses: Normal pulses.     Heart sounds: Normal heart sounds.  Pulmonary:     Effort: Pulmonary effort is normal. No  respiratory distress.     Breath sounds: Normal breath sounds. No wheezing or rales.  Abdominal:     General: Bowel sounds are normal. There is distension.     Palpations: Abdomen is soft. There is no mass.     Tenderness: There is no abdominal tenderness. There is no guarding or rebound.     Hernia: No hernia is present.  Musculoskeletal:     Cervical back: Neck supple.     Right lower leg: Edema present.     Left lower leg: Edema present.     Comments: Non pitting  Skin:    General: Skin is warm.     Capillary Refill: Capillary refill takes less than 2 seconds.  Neurological:     General: No focal deficit present.     Mental Status: She is alert and oriented to person, place, and time.     Gait: Gait abnormal.  Psychiatric:        Mood and Affect: Mood normal.     Labs reviewed: Recent Labs    02/19/24 0000 08/02/24 0000  NA 143 144  K 4.8 4.2  CL 109* 107  CO2 27* 28*  BUN 34* 37*  CREATININE 1.3* 1.3*  CALCIUM  8.8 8.8   Recent Labs    08/02/24 0000  AST 14  ALT 4*  ALKPHOS 86  ALBUMIN 3.8   Recent Labs    02/19/24 0000 08/02/24 0000  WBC 5.5 6.2  NEUTROABS  --  3,373.00  HGB 12.8 13.1  HCT 39 40  PLT 252 236   Lab Results  Component Value Date   TSH 0.78 08/02/2024   No results found for: HGBA1C Lab Results  Component Value Date   CHOL 143 08/02/2024   HDL 52 08/02/2024   LDLCALC 70 08/02/2024   TRIG 125 08/02/2024   CHOLHDL 3 09/22/2009    Significant Diagnostic Results in last 30 days:  No results found.  Assessment/Plan 1. Vomiting without nausea, unspecified vomiting type (Primary) - 2 episodes within last 24 hours and noted 08/21 - abdomen distended, unable to recall last normal BM, afebrile - recent KUB unremarkable> nonspecific nonobstructive gas pattern - refused repeat KUB today - cont miralax  every other day - add miralax  dose today - cont zofran  prn if nausea occurs - discussed avoiding acidic and greasy foods   2.  Gastroesophageal reflux disease without esophagitis - 08/21 Protonix  increased to 40 mg BID - recent hgb 13.1 08/02/2024    Family/ staff Communication: plan discussed with patient and nurse  Labs/tests ordered:  none

## 2024-09-22 ENCOUNTER — Non-Acute Institutional Stay: Payer: Self-pay | Admitting: Orthopedic Surgery

## 2024-09-22 ENCOUNTER — Encounter: Payer: Self-pay | Admitting: Orthopedic Surgery

## 2024-09-22 DIAGNOSIS — K219 Gastro-esophageal reflux disease without esophagitis: Secondary | ICD-10-CM

## 2024-09-22 DIAGNOSIS — G20A1 Parkinson's disease without dyskinesia, without mention of fluctuations: Secondary | ICD-10-CM | POA: Diagnosis not present

## 2024-09-22 DIAGNOSIS — K5901 Slow transit constipation: Secondary | ICD-10-CM | POA: Diagnosis not present

## 2024-09-22 DIAGNOSIS — R112 Nausea with vomiting, unspecified: Secondary | ICD-10-CM

## 2024-09-22 MED ORDER — SENNOSIDES 8.6 MG PO TABS: 1.0000 | ORAL_TABLET | Freq: Every day | ORAL | Status: AC

## 2024-09-22 NOTE — Progress Notes (Signed)
 Location:  [facility] Room Number: 25/A Place of Service:  ALF [phone]) Provider:  Greig FORBES Cluster, NP   Charlanne Fredia CROME, MD  Patient Care Team: Charlanne Fredia CROME, MD as PCP - General (Internal Medicine) Wonda Sharper, MD as PCP - Cardiology (Cardiology) Signa Rush, MD (Inactive) (Internal Medicine) Tat, Asberry RAMAN, DO as Consulting Physician (Neurology)  Extended Emergency Contact Information Primary Emergency Contact: [facility] Phone: [phone] Mobile Phone: [phone] Relation: Daughter  Code Status:  DNR Goals of care: Advanced Directive information    06/17/2024   10:27 AM  Advanced Directives  Does Patient Have a Medical Advance Directive? Yes  Type of Advance Directive Out of facility DNR (pink MOST or yellow form)  Does patient want to make changes to medical advance directive? No - Patient declined     Chief Complaint  Patient presents with   Acute Visit    Nausea and vomiting    HPI:  Pt is a 89 y.o. female seen today for acute visit due to nausea and vomiting.   She currently resides on the assisted living unit at [facility]. PMH: atrial fibrillation, MI, acute PE, DVT, HTN, HLD, GERD, constipation, parkinson's, CKD, right hemiarthroplasty 07/03/2022, and unstable gait.   3 episodes of nausea and vomiting within past 8 weeks. Recent episode this morning after eating breakfast. She felt better after episode. Reports irregular bowel movements and distended abdomen. She states last BM was a few days ago. 09/24 miralax  every other day was started. Also prescribed colace and Protonix  BID. Last KUB revealed nonspecific nonobstructive gas pattern. Afebrile. Vitals stable.      Past Medical History:  Diagnosis Date   Coronary artery disease    Methicillin resistant Staphylococcus aureus in conditions classified elsewhere and of unspecified site    MI (myocardial infarction) (HCC)    Other and unspecified hyperlipidemia     Unspecified essential hypertension    Past Surgical History:  Procedure Laterality Date   CATARACT EXTRACTION, BILATERAL     CORONARY ANGIOPLASTY WITH STENT PLACEMENT     HIP ARTHROPLASTY Right 07/04/2022   Procedure: RIGHT ANTERIOR HIP REPLACEMENT ;  Surgeon: Vernetta Lonni GRADE, MD;  Location: [facility];  Service: Orthopedics;  Laterality: Right;    Allergies  Allergen Reactions   Norvasc [Amlodipine] Anaphylaxis, Swelling and Other (See Comments)    Edema   Altace [Ramipril] Cough   Fosamax [Alendronate] Other (See Comments)    Dysphagia    Outpatient Encounter Medications as of 09/22/2024  Medication Sig   acetaminophen  (TYLENOL ) 325 MG tablet Take 650 mg by mouth 3 (three) times daily.   aspirin  EC 81 MG tablet Take 81 mg by mouth daily. Swallow whole.   atorvastatin  (LIPITOR) 10 MG tablet Take 1 tablet (10 mg total) by mouth at bedtime for 5 days.   carbidopa -levodopa  (SINEMET  IR) 25-100 MG tablet 1.5 tablets at 7am/11am/4pm   docusate sodium  (COLACE) 100 MG capsule Take 1 capsule (100 mg total) by mouth 2 (two) times daily.   furosemide (LASIX) 20 MG tablet Take 20 mg by mouth daily as needed for fluid.   mirtazapine  (REMERON ) 7.5 MG tablet Take 1 tablet (7.5 mg total) by mouth at bedtime.   nitroGLYCERIN  (NITROSTAT ) 0.4 MG SL tablet Place 1 tablet (0.4 mg total) under the tongue every 5 (five) minutes as needed for chest pain.   ondansetron  (ZOFRAN ) 4 MG tablet Take 4 mg by mouth every 6 (six) hours as needed for nausea or vomiting.  pantoprazole  (PROTONIX ) 40 MG tablet Take 1 tablet (40 mg total) by mouth 2 (two) times daily.   polyethylene glycol (MIRALAX  / GLYCOLAX ) 17 g packet Take 17 g by mouth every other day.   zinc  oxide 20 % ointment Apply 1 Application topically as needed for irritation.   No facility-administered encounter medications on file as of 09/22/2024.    Review of Systems  Constitutional:  Negative for fatigue and fever.  Respiratory:  Negative for  cough and shortness of breath.   Cardiovascular:  Negative for chest pain and leg swelling.  Gastrointestinal:  Positive for abdominal distention, constipation, nausea and vomiting. Negative for abdominal pain and diarrhea.  Genitourinary:  Negative for dysuria and hematuria.  Musculoskeletal:  Positive for gait problem.  Skin:  Negative for wound.  Neurological:  Positive for weakness. Negative for dizziness and light-headedness.  Psychiatric/Behavioral:  Negative for confusion, dysphoric mood and sleep disturbance. The patient is not nervous/anxious.     Immunization History  Administered Date(s) Administered   Fluad Quad(high Dose 65+) 10/02/2022   Fluad Trivalent(High Dose 65+) 10/07/2023   Fluzone Influenza virus vaccine,trivalent (IIV3), split virus 09/08/2013, 09/09/2015, 10/21/2016, 09/22/2019, 09/13/2021   Influenza Split 09/24/2010, 09/04/2011, 09/18/2012   Influenza-Unspecified 11/09/2014, 09/30/2015, 09/17/2016, 09/01/2017, 09/10/2018   Moderna Covid-19 Vaccine Bivalent Booster 66yrs & up 05/22/2022   Moderna Sars-Covid-2 Vaccination 12/13/2019, 01/09/2021, 05/16/2021   PFIZER(Purple Top)SARS-COV-2 Vaccination 01/10/2020, 10/15/2022   Pfizer Covid-19 Vaccine Bivalent Booster 37yrs & up 08/30/2021   Pneumococcal Conjugate-13 04/18/2014   Pneumococcal Polysaccharide-23 09/20/2002, 09/23/2019   Td 10/11/2004   Tdap 06/14/2016   Zoster Recombinant(Shingrix) 02/10/2019, 05/04/2019   Zoster, Live 01/23/2009   Pertinent  Health Maintenance Due  Topic Date Due   Influenza Vaccine  07/09/2024   DEXA SCAN  Completed      10/25/2022   12:09 PM 09/12/2023   10:27 AM 10/08/2023   11:34 AM 07/27/2024   10:57 AM 09/01/2024    2:34 PM  Fall Risk  Falls in the past year? 1 0 0 1 1  Was there an injury with Fall? 1 0 0 0 1  Fall Risk Category Calculator 3 0 0 2 3  Fall Risk Category (Retired) High       (RETIRED) Patient Fall Risk Level High fall risk       Patient at Risk for  Falls Due to History of fall(s) History of fall(s);Impaired balance/gait;Impaired mobility History of fall(s);Impaired balance/gait;Impaired mobility  History of fall(s);Impaired balance/gait;Impaired mobility  Fall risk Follow up Falls evaluation completed  Falls evaluation completed;Education provided;Falls prevention discussed Falls evaluation completed;Education provided;Falls prevention discussed Falls evaluation completed Falls evaluation completed     Data saved with a previous flowsheet row definition   Functional Status Survey:    Vitals:   09/22/24 1532  BP: 111/74  Pulse: 71  Resp: 17  Temp: 97.8 F (36.6 C)  SpO2: 96%  Weight: 111 lb 3.2 oz (50.4 kg)  Height: 5' 6 (1.676 m)   Body mass index is 17.95 kg/m. Physical Exam Vitals reviewed.  Constitutional:      General: She is not in acute distress.    Appearance: She is not ill-appearing.  HENT:     Head: Normocephalic.  Eyes:     General:        Right eye: No discharge.        Left eye: No discharge.  Cardiovascular:     Rate and Rhythm: Normal rate and regular rhythm.     Pulses: Normal  pulses.     Heart sounds: Normal heart sounds.  Pulmonary:     Effort: Pulmonary effort is normal.     Breath sounds: Normal breath sounds.  Abdominal:     General: There is distension.     Palpations: Abdomen is soft. There is no mass.     Tenderness: There is no abdominal tenderness. There is no guarding or rebound.     Hernia: No hernia is present.     Comments: Lower quadrants hypoactive  Musculoskeletal:     Cervical back: Neck supple.     Right lower leg: No edema.     Left lower leg: No edema.  Skin:    General: Skin is warm.     Capillary Refill: Capillary refill takes less than 2 seconds.  Neurological:     General: No focal deficit present.     Mental Status: She is alert and oriented to person, place, and time.     Gait: Gait abnormal.  Psychiatric:        Mood and Affect: Mood normal.     Labs  reviewed: Recent Labs    02/19/24 0000 08/02/24 0000  NA 143 144  K 4.8 4.2  CL 109* 107  CO2 27* 28*  BUN 34* 37*  CREATININE 1.3* 1.3*  CALCIUM  8.8 8.8   Recent Labs    08/02/24 0000  AST 14  ALT 4*  ALKPHOS 86  ALBUMIN 3.8   Recent Labs    02/19/24 0000 08/02/24 0000  WBC 5.5 6.2  NEUTROABS  --  3,373.00  HGB 12.8 13.1  HCT 39 40  PLT 252 236   Lab Results  Component Value Date   TSH 0.78 08/02/2024   No results found for: HGBA1C Lab Results  Component Value Date   CHOL 143 08/02/2024   HDL 52 08/02/2024   LDLCALC 70 08/02/2024   TRIG 125 08/02/2024   CHOLHDL 3 09/22/2009    Significant Diagnostic Results in last 30 days:  No results found.  Assessment/Plan 1. Nausea and vomiting, unspecified vomiting type (Primary) - 3 episodes in 8 weeks - last KUB suggestive of constipation - unable to recall last BM - abdomen distended, lower quadrants hypoactive - cont Protonix  BID - consider adding Reglan  in future  2. Slow transit constipation - see above  - cont miralax  every other day - add miralax  single dose at bedtime - discontinue colace - will try senna  - senna (SENOKOT) 8.6 MG tablet; Take 1 tablet (8.6 mg total) by mouth at bedtime.  3. Gastroesophageal reflux disease without esophagitis - cont Protonix   4. Parkinson's disease without dyskinesia or fluctuating manifestations (HCC) - followed by neurology - improved gait with PT/OT - cont sinemet     Family/ staff Communication: plan discussed with patient and nurse  Labs/tests ordered:  none

## 2024-10-01 ENCOUNTER — Encounter: Payer: Self-pay | Admitting: Adult Health

## 2024-10-01 ENCOUNTER — Non-Acute Institutional Stay: Payer: Self-pay | Admitting: Adult Health

## 2024-10-01 DIAGNOSIS — G20A1 Parkinson's disease without dyskinesia, without mention of fluctuations: Secondary | ICD-10-CM | POA: Diagnosis not present

## 2024-10-01 DIAGNOSIS — F5101 Primary insomnia: Secondary | ICD-10-CM

## 2024-10-01 DIAGNOSIS — E785 Hyperlipidemia, unspecified: Secondary | ICD-10-CM | POA: Diagnosis not present

## 2024-10-01 DIAGNOSIS — K219 Gastro-esophageal reflux disease without esophagitis: Secondary | ICD-10-CM

## 2024-10-01 DIAGNOSIS — N1832 Chronic kidney disease, stage 3b: Secondary | ICD-10-CM

## 2024-10-01 NOTE — Progress Notes (Signed)
 Location:  [facility]  Nursing Home Room Number: 25-A Place of Service:  ALF [phone]) Provider:  Jereld Serum NP   Charlanne Fredia CROME, MD  Patient Care Team: Charlanne Fredia CROME, MD as PCP - General (Internal Medicine) Wonda Sharper, MD as PCP - Cardiology (Cardiology) Signa Rush, MD (Inactive) (Internal Medicine) Tat, Asberry RAMAN, DO as Consulting Physician (Neurology)  Extended Emergency Contact Information Primary Emergency Contact: [facility] Phone: [phone] Mobile Phone: [phone] Relation: Daughter  Code Status:  DNR Goals of care: Advanced Directive information    10/01/2024    1:40 PM  Advanced Directives  Does Patient Have a Medical Advance Directive? Yes  Type of Advance Directive Out of facility DNR (pink MOST or yellow form)  Does patient want to make changes to medical advance directive? No - Patient declined  Pre-existing out of facility DNR order (yellow form or pink MOST form) Yellow form placed in chart (order not valid for inpatient use);Pink MOST form placed in chart (order not valid for inpatient use)     Chief Complaint  Patient presents with   Medical Management of Chronic Issues    Routine visit  Pt flu vaccine not document in [facility]    HPI:  Pt is a 89 y.o. female seen today for medical management of chronic diseases. She is a resident of [facility] [address] ALF.    Parkinson's disease without dyskinesia or fluctuating manifestations (HCC) -  takes Carbidopa -Levodopa   Gastroesophageal reflux disease, unspecified whether esophagitis present -  denies heartburns, takes Pantoprazole   Hyperlipidemia, unspecified hyperlipidemia type -  08/02/24 cholesterol 143, DL 70, triglycerides 874, takes atorvastatin   Primary insomnia continue- takes Remeron   Stage 3b chronic kidney disease (HCC)  -  GFR 38 (08/02/2024) and GFR 40 (02/19/2024)   Past Medical History:  Diagnosis Date   Coronary artery disease    Methicillin resistant  Staphylococcus aureus in conditions classified elsewhere and of unspecified site    MI (myocardial infarction) (HCC)    Other and unspecified hyperlipidemia    Unspecified essential hypertension    Past Surgical History:  Procedure Laterality Date   CATARACT EXTRACTION, BILATERAL     CORONARY ANGIOPLASTY WITH STENT PLACEMENT     HIP ARTHROPLASTY Right 07/04/2022   Procedure: RIGHT ANTERIOR HIP REPLACEMENT ;  Surgeon: Vernetta Lonni GRADE, MD;  Location: [facility];  Service: Orthopedics;  Laterality: Right;    Allergies  Allergen Reactions   Norvasc [Amlodipine] Anaphylaxis, Swelling and Other (See Comments)    Edema   Altace [Ramipril] Cough   Fosamax [Alendronate] Other (See Comments)    Dysphagia    Allergies as of 10/01/2024       Reactions   Norvasc [amlodipine] Anaphylaxis, Swelling, Other (See Comments)   Edema   Altace [ramipril] Cough   Fosamax [alendronate] Other (See Comments)   Dysphagia        Medication List        Accurate as of October 01, 2024  1:40 PM. If you have any questions, ask your nurse or doctor.          acetaminophen  325 MG tablet Commonly known as: TYLENOL  Take 650 mg by mouth 3 (three) times daily.   aspirin  EC 81 MG tablet Take 81 mg by mouth daily. Swallow whole.   atorvastatin  10 MG tablet Commonly known as: LIPITOR Take 1 tablet (10 mg total) by mouth at bedtime for 5 days.   carbidopa -levodopa  25-100 MG tablet Commonly known as: SINEMET  IR 1.5 tablets at  7am/11am/4pm   furosemide 20 MG tablet Commonly known as: LASIX Take 20 mg by mouth daily as needed for fluid.   mirtazapine  7.5 MG tablet Commonly known as: REMERON  Take 1 tablet (7.5 mg total) by mouth at bedtime.   nitroGLYCERIN  0.4 MG SL tablet Commonly known as: NITROSTAT  Place 1 tablet (0.4 mg total) under the tongue every 5 (five) minutes as needed for chest pain.   ondansetron  4 MG tablet Commonly known as: ZOFRAN  Take 4 mg by mouth every 6 (six) hours  as needed for nausea or vomiting.   pantoprazole  40 MG tablet Commonly known as: Protonix  Take 1 tablet (40 mg total) by mouth 2 (two) times daily.   polyethylene glycol 17 g packet Commonly known as: MIRALAX  / GLYCOLAX  Take 17 g by mouth every other day.   senna 8.6 MG tablet Commonly known as: SENOKOT Take 1 tablet (8.6 mg total) by mouth at bedtime.   zinc  oxide 20 % ointment Apply 1 Application topically as needed for irritation.        Review of Systems  Constitutional:  Negative for appetite change, chills, fatigue and fever.  HENT:  Negative for congestion, hearing loss, rhinorrhea and sore throat.   Eyes: Negative.   Respiratory:  Negative for cough, shortness of breath and wheezing.   Cardiovascular:  Negative for chest pain, palpitations and leg swelling.  Gastrointestinal:  Negative for abdominal pain, constipation, diarrhea, nausea and vomiting.  Genitourinary:  Negative for dysuria.  Musculoskeletal:  Negative for arthralgias, back pain and myalgias.  Skin:  Negative for color change, rash and wound.  Neurological:  Negative for dizziness, weakness and headaches.  Psychiatric/Behavioral:  Negative for behavioral problems. The patient is not nervous/anxious.     Immunization History  Administered Date(s) Administered   Fluad Quad(high Dose 65+) 10/02/2022   Fluad Trivalent(High Dose 65+) 10/07/2023   Fluzone Influenza virus vaccine,trivalent (IIV3), split virus 09/08/2013, 09/09/2015, 10/21/2016, 09/22/2019, 09/13/2021   Influenza Split 09/24/2010, 09/04/2011, 09/18/2012   Influenza-Unspecified 11/09/2014, 09/30/2015, 09/17/2016, 09/01/2017, 09/10/2018   Moderna Covid-19 Vaccine Bivalent Booster 38yrs & up 05/22/2022   Moderna Sars-Covid-2 Vaccination 12/13/2019, 01/09/2021, 05/16/2021   PFIZER(Purple Top)SARS-COV-2 Vaccination 01/10/2020, 10/15/2022   Pfizer Covid-19 Vaccine Bivalent Booster 71yrs & up 08/30/2021   Pneumococcal Conjugate-13 04/18/2014    Pneumococcal Polysaccharide-23 09/20/2002, 09/23/2019   Td 10/11/2004   Tdap 06/14/2016   Zoster Recombinant(Shingrix) 02/10/2019, 05/04/2019   Zoster, Live 01/23/2009   Pertinent  Health Maintenance Due  Topic Date Due   Influenza Vaccine  07/09/2024   DEXA SCAN  Completed      09/12/2023   10:27 AM 10/08/2023   11:34 AM 07/27/2024   10:57 AM 09/01/2024    2:34 PM 09/22/2024    3:49 PM  Fall Risk  Falls in the past year? 0 0 1 1 1   Was there an injury with Fall? 0 0 0 1 1  Fall Risk Category Calculator 0 0 2 3 2   Patient at Risk for Falls Due to History of fall(s);Impaired balance/gait;Impaired mobility History of fall(s);Impaired balance/gait;Impaired mobility  History of fall(s);Impaired balance/gait;Impaired mobility History of fall(s);Impaired balance/gait;Impaired mobility  Fall risk Follow up Falls evaluation completed;Education provided;Falls prevention discussed Falls evaluation completed;Education provided;Falls prevention discussed Falls evaluation completed Falls evaluation completed Falls evaluation completed;Education provided   Functional Status Survey:    Vitals:   10/01/24 1330  BP: 138/72  Pulse: 63  Temp: (!) 97.3 F (36.3 C)  SpO2: 99%  Weight: 111 lb 3.2 oz (50.4 kg)  Height: 5' 6 (1.676 m)   Body mass index is 17.95 kg/m. Physical Exam Constitutional:      General: She is not in acute distress. HENT:     Head: Normocephalic and atraumatic.     Nose: Nose normal.     Mouth/Throat:     Mouth: Mucous membranes are moist.  Eyes:     Conjunctiva/sclera: Conjunctivae normal.  Cardiovascular:     Rate and Rhythm: Normal rate and regular rhythm.  Pulmonary:     Effort: Pulmonary effort is normal.     Breath sounds: Normal breath sounds.  Abdominal:     General: Bowel sounds are normal.     Palpations: Abdomen is soft.  Musculoskeletal:        General: Normal range of motion.     Cervical back: Normal range of motion.  Skin:    General: Skin is  warm and dry.  Neurological:     Mental Status: She is alert.  Psychiatric:        Mood and Affect: Mood normal.        Behavior: Behavior normal.     Labs reviewed: Recent Labs    02/19/24 0000 08/02/24 0000  NA 143 144  K 4.8 4.2  CL 109* 107  CO2 27* 28*  BUN 34* 37*  CREATININE 1.3* 1.3*  CALCIUM  8.8 8.8   Recent Labs    08/02/24 0000  AST 14  ALT 4*  ALKPHOS 86  ALBUMIN 3.8   Recent Labs    02/19/24 0000 08/02/24 0000  WBC 5.5 6.2  NEUTROABS  --  3,373.00  HGB 12.8 13.1  HCT 39 40  PLT 252 236   Lab Results  Component Value Date   TSH 0.78 08/02/2024   No results found for: HGBA1C Lab Results  Component Value Date   CHOL 143 08/02/2024   HDL 52 08/02/2024   LDLCALC 70 08/02/2024   TRIG 125 08/02/2024   CHOLHDL 3 09/22/2009    Significant Diagnostic Results in last 30 days:  No results found.  Assessment/Plan  1. Parkinson's disease without dyskinesia or fluctuating manifestations (HCC) (Primary) - Continue carbidopa /levodopa  25-100 mg 1.5 tab 3 times a day.   -   Follows up with neurology -  Fall precautions  2. Gastroesophageal reflux disease, unspecified whether esophagitis present -   Stable -   Continue pantoprazole  40 mg twice a day  3. Hyperlipidemia, unspecified hyperlipidemia type Lab Results  Component Value Date   CHOL 143 08/02/2024   HDL 52 08/02/2024   LDLCALC 70 08/02/2024   TRIG 125 08/02/2024   CHOLHDL 3 09/22/2009    -  at goal -   Continue continue atorvastatin  10 mg daily  4. Primary insomnia -   Continue Remeron  7.5 mg at bedtime  5. Stage 3b chronic kidney disease [facility]) Lab Results  Component Value Date   NA 144 08/02/2024   K 4.2 08/02/2024   CO2 28 (A) 08/02/2024   GLUCOSE 79 07/24/2022   BUN 37 (A) 08/02/2024   CREATININE 1.3 (A) 08/02/2024   CALCIUM  8.8 08/02/2024   GFR 45.19 (L) 05/22/2015   EGFR 38 08/02/2024   GFRNONAA 47 (L) 07/24/2022    -  st 25able    Family/ staff  Communication: Discussed plan of care with resident and charge nurse.  Labs/tests ordered: None

## 2024-10-11 ENCOUNTER — Encounter: Payer: Self-pay | Admitting: Adult Health

## 2024-10-11 ENCOUNTER — Non-Acute Institutional Stay: Payer: Self-pay | Admitting: Adult Health

## 2024-10-11 DIAGNOSIS — J9601 Acute respiratory failure with hypoxia: Secondary | ICD-10-CM | POA: Diagnosis not present

## 2024-10-11 DIAGNOSIS — I509 Heart failure, unspecified: Secondary | ICD-10-CM

## 2024-10-11 DIAGNOSIS — N1832 Chronic kidney disease, stage 3b: Secondary | ICD-10-CM

## 2024-10-11 DIAGNOSIS — G20A1 Parkinson's disease without dyskinesia, without mention of fluctuations: Secondary | ICD-10-CM

## 2024-10-11 NOTE — Progress Notes (Signed)
 Location:  [facility] [employer] of Service:   ALF  Rm. 25 A Provider:  Medina-Vargas, Zyler Hyson, DNP, FNP-BC  Patient Care Team: Charlanne Fredia CROME, MD as PCP - General (Internal Medicine) Wonda Sharper, MD as PCP - Cardiology (Cardiology) Signa Rush, MD (Inactive) (Internal Medicine) Tat, Asberry RAMAN, DO as Consulting Physician (Neurology)  Extended Emergency Contact Information Primary Emergency Contact: [facility] Phone: [phone] Mobile Phone: [phone] Relation: Daughter  Code Status:   DNR  Goals of care: Advanced Directive information    10/01/2024    1:40 PM  Advanced Directives  Does Patient Have a Medical Advance Directive? Yes  Type of Advance Directive Out of facility DNR (pink MOST or yellow form)  Does patient want to make changes to medical advance directive? No - Patient declined  Pre-existing out of facility DNR order (yellow form or pink MOST form) Yellow form placed in chart (order not valid for inpatient use);Pink MOST form placed in chart (order not valid for inpatient use)     Chief Complaint  Patient presents with   Acute Visit    Shortness of breath episode    HPI:  Pt is a 89 y.o. female seen today for an acute visit regarding shortness of breath. She is a resident of [facility] [address] ALF. She had episode of shortness of breath 2 days ago. She was given Lasix 20 mg PO X 1. Chest x-ray showed mildly diffuse pulmonary venous congestion with interstitial edema and small bilateral pleural effusions.  No focal alveolar consolidation.  No pneumothorax/findings most consistent with congestive heart failure and pulmonary edema focal consolidation. BNP done was 606. She has chronic kidney disease stage 3B with latest GFR 38. She was ordered to have Lasix 40 mg daily X 3 days.  She was seen in the room together with her daughter and son-in-law.  She has O2 at 2 L via [place], with oxygen saturation of 95%.  She has Parkinson's disease and  currently taking carbidopa /levodopa .   Past Medical History:  Diagnosis Date   Coronary artery disease    Methicillin resistant Staphylococcus aureus in conditions classified elsewhere and of unspecified site    MI (myocardial infarction) (HCC)    Other and unspecified hyperlipidemia    Unspecified essential hypertension    Past Surgical History:  Procedure Laterality Date   CATARACT EXTRACTION, BILATERAL     CORONARY ANGIOPLASTY WITH STENT PLACEMENT     HIP ARTHROPLASTY Right 07/04/2022   Procedure: RIGHT ANTERIOR HIP REPLACEMENT ;  Surgeon: Vernetta Lonni GRADE, MD;  Location: [facility];  Service: Orthopedics;  Laterality: Right;    Allergies  Allergen Reactions   Norvasc [Amlodipine] Anaphylaxis, Swelling and Other (See Comments)    Edema   Altace [Ramipril] Cough   Fosamax [Alendronate] Other (See Comments)    Dysphagia    Outpatient Encounter Medications as of 10/11/2024  Medication Sig   acetaminophen  (TYLENOL ) 325 MG tablet Take 650 mg by mouth 3 (three) times daily.   aspirin  EC 81 MG tablet Take 81 mg by mouth daily. Swallow whole.   atorvastatin  (LIPITOR) 10 MG tablet Take 1 tablet (10 mg total) by mouth at bedtime for 5 days.   carbidopa -levodopa  (SINEMET  IR) 25-100 MG tablet 1.5 tablets at 7am/11am/4pm   furosemide (LASIX) 20 MG tablet Take 20 mg by mouth daily as needed for fluid.   mirtazapine  (REMERON ) 7.5 MG tablet Take 1 tablet (7.5 mg total) by mouth at bedtime.   nitroGLYCERIN  (NITROSTAT ) 0.4 MG  SL tablet Place 1 tablet (0.4 mg total) under the tongue every 5 (five) minutes as needed for chest pain.   ondansetron  (ZOFRAN ) 4 MG tablet Take 4 mg by mouth every 6 (six) hours as needed for nausea or vomiting. (Patient not taking: Reported on 10/01/2024)   pantoprazole  (PROTONIX ) 40 MG tablet Take 1 tablet (40 mg total) by mouth 2 (two) times daily.   polyethylene glycol (MIRALAX  / GLYCOLAX ) 17 g packet Take 17 g by mouth every other day.   senna (SENOKOT) 8.6 MG  tablet Take 1 tablet (8.6 mg total) by mouth at bedtime.   zinc  oxide 20 % ointment Apply 1 Application topically as needed for irritation.   No facility-administered encounter medications on file as of 10/11/2024.    Review of Systems  Constitutional:  Negative for appetite change, chills, fatigue and fever.  HENT:  Negative for congestion, hearing loss, rhinorrhea and sore throat.   Eyes: Negative.   Respiratory:  Positive for shortness of breath. Negative for cough and wheezing.   Cardiovascular:  Negative for chest pain, palpitations and leg swelling.  Gastrointestinal:  Negative for abdominal pain, constipation, diarrhea, nausea and vomiting.  Genitourinary:  Negative for dysuria.  Musculoskeletal:  Negative for arthralgias, back pain and myalgias.  Skin:  Negative for color change, rash and wound.  Neurological:  Negative for dizziness, weakness and headaches.  Psychiatric/Behavioral:  Negative for behavioral problems. The patient is not nervous/anxious.       Immunization History  Administered Date(s) Administered   Fluad Quad(high Dose 65+) 10/02/2022   Fluad Trivalent(High Dose 65+) 10/07/2023   Fluzone Influenza virus vaccine,trivalent (IIV3), split virus 09/08/2013, 09/09/2015, 10/21/2016, 09/22/2019, 09/13/2021   Influenza Split 09/24/2010, 09/04/2011, 09/18/2012   Influenza-Unspecified 11/09/2014, 09/30/2015, 09/17/2016, 09/01/2017, 09/10/2018   Moderna Covid-19 Vaccine Bivalent Booster 46yrs & up 05/22/2022   Moderna Sars-Covid-2 Vaccination 12/13/2019, 01/09/2021, 05/16/2021   PFIZER(Purple Top)SARS-COV-2 Vaccination 01/10/2020, 10/15/2022   Pfizer Covid-19 Vaccine Bivalent Booster 27yrs & up 08/30/2021   Pneumococcal Conjugate-13 04/18/2014   Pneumococcal Polysaccharide-23 09/20/2002, 09/23/2019   Td 10/11/2004   Tdap 06/14/2016   Zoster Recombinant(Shingrix) 02/10/2019, 05/04/2019   Zoster, Live 01/23/2009   Pertinent  Health Maintenance Due  Topic Date Due    Influenza Vaccine  07/09/2024   DEXA SCAN  Completed      09/12/2023   10:27 AM 10/08/2023   11:34 AM 07/27/2024   10:57 AM 09/01/2024    2:34 PM 09/22/2024    3:49 PM  Fall Risk  Falls in the past year? 0 0 1 1 1   Was there an injury with Fall? 0 0 0 1 1  Fall Risk Category Calculator 0 0 2 3 2   Patient at Risk for Falls Due to History of fall(s);Impaired balance/gait;Impaired mobility History of fall(s);Impaired balance/gait;Impaired mobility  History of fall(s);Impaired balance/gait;Impaired mobility History of fall(s);Impaired balance/gait;Impaired mobility  Fall risk Follow up Falls evaluation completed;Education provided;Falls prevention discussed Falls evaluation completed;Education provided;Falls prevention discussed Falls evaluation completed Falls evaluation completed Falls evaluation completed;Education provided     Vitals:   10/11/24 1710  BP: 109/75  Pulse: 65  Resp: 17  Temp: 97.7 F (36.5 C)  SpO2: 95%  Weight: 106 lb (48.1 kg)   Body mass index is 17.11 kg/m.  Physical Exam Constitutional:      Appearance: Normal appearance.  HENT:     Head: Normocephalic and atraumatic.     Nose: Nose normal.     Mouth/Throat:     Mouth: Mucous membranes are  moist.  Eyes:     Conjunctiva/sclera: Conjunctivae normal.  Cardiovascular:     Rate and Rhythm: Normal rate and regular rhythm.  Pulmonary:     Effort: Pulmonary effort is normal.     Breath sounds: Normal breath sounds.     Comments: O2 @ 2L/min via [place] Abdominal:     General: Bowel sounds are normal.     Palpations: Abdomen is soft.  Musculoskeletal:        General: Normal range of motion.     Cervical back: Normal range of motion.  Skin:    General: Skin is warm and dry.  Neurological:     Mental Status: She is alert.  Psychiatric:        Mood and Affect: Mood normal.        Behavior: Behavior normal.      Labs reviewed: Recent Labs    02/19/24 0000 08/02/24 0000  NA 143 144  K 4.8 4.2  CL  109* 107  CO2 27* 28*  BUN 34* 37*  CREATININE 1.3* 1.3*  CALCIUM  8.8 8.8   Recent Labs    08/02/24 0000  AST 14  ALT 4*  ALKPHOS 86  ALBUMIN 3.8   Recent Labs    02/19/24 0000 08/02/24 0000  WBC 5.5 6.2  NEUTROABS  --  3,373.00  HGB 12.8 13.1  HCT 39 40  PLT 252 236   Lab Results  Component Value Date   TSH 0.78 08/02/2024   No results found for: HGBA1C Lab Results  Component Value Date   CHOL 143 08/02/2024   HDL 52 08/02/2024   LDLCALC 70 08/02/2024   TRIG 125 08/02/2024   CHOLHDL 3 09/22/2009    Significant Diagnostic Results in last 30 days:  No results found.  Assessment/Plan  1. Acute congestive heart failure, unspecified heart failure type (HCC) (Primary) -  had episode of shortness of breath 2 days ago. She was given Lasix 20 mg PO X 1. Chest x-ray showed mildly diffuse pulmonary venous congestion with interstitial edema and small bilateral pleural effusions.  No focal alveolar consolidation.  No pneumothorax/findings most consistent with congestive heart failure and pulmonary edema focal consolidation. BNP done was 606. -  continue Lasix 40 mg Daily X 3 days  2. Stage 3b chronic kidney disease (HCC) -   latest GFR 38 -  will monitor  3. Acute respiratory failure with hypoxia (HCC) -  continue O2 @ 2L/min via [place] -   taper off O2, as tolerated, in 3 days  4. Parkinson's disease without dyskinesia or fluctuating manifestations (HCC) -   continue Carbidopa -Levodopa  -  fall precautions   Family/ staff Communication: Discussed plan of care with resident,, daughter and charge nurse  Labs/tests ordered: awaiting BMP result    Jereld Serum, DNP, MSN, FNP-BC [facility] [phone] (Monday-Friday 8:00 a.m. - 5:00 p.m.) [phone] (after hours)

## 2024-10-14 ENCOUNTER — Non-Acute Institutional Stay: Payer: Self-pay | Admitting: Internal Medicine

## 2024-10-14 ENCOUNTER — Encounter: Payer: Self-pay | Admitting: Internal Medicine

## 2024-10-14 DIAGNOSIS — G903 Multi-system degeneration of the autonomic nervous system: Secondary | ICD-10-CM

## 2024-10-14 DIAGNOSIS — I509 Heart failure, unspecified: Secondary | ICD-10-CM | POA: Diagnosis not present

## 2024-10-14 DIAGNOSIS — G20A1 Parkinson's disease without dyskinesia, without mention of fluctuations: Secondary | ICD-10-CM | POA: Diagnosis not present

## 2024-10-14 DIAGNOSIS — N1832 Chronic kidney disease, stage 3b: Secondary | ICD-10-CM | POA: Diagnosis not present

## 2024-10-14 MED ORDER — FUROSEMIDE 20 MG PO TABS
20.0000 mg | ORAL_TABLET | ORAL | Status: AC
Start: 2024-10-15 — End: ?

## 2024-10-14 NOTE — Progress Notes (Signed)
 Location: [facility] Service:  [facility] [phone])  Provider:   Code Status: DNR Goals of Care:     10/01/2024    1:40 PM  Advanced Directives  Does Patient Have a Medical Advance Directive? Yes  Type of Advance Directive Out of facility DNR (pink MOST or yellow form)  Does patient want to make changes to medical advance directive? No - Patient declined  Pre-existing out of facility DNR order (yellow form or pink MOST form) Yellow form placed in chart (order not valid for inpatient use);Pink MOST form placed in chart (order not valid for inpatient use)     Chief Complaint  Patient presents with   Acute Visit    HPI: Patient is a 89 y.o. 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  H/o PSVT     GERD, HLD,HTN, CKD ,stage 3 a and Parkinson disease Cognitive impairment  Over the weekend patient got SOB  BNP was 606 Xray showed Intestinal Edema and Bilateral Pleural effusion She was given Lasix for few days  Today patient gained weight again From 98 to 103 lbs She continues to be SOB and needing Oxygen Denies Chest pain Fever or Cough  Edema in Legs in Minimal  BP is running little Low     Past Medical History:  Diagnosis Date   Coronary artery disease    Methicillin resistant Staphylococcus aureus in conditions classified elsewhere and of unspecified site    MI (myocardial infarction) (HCC)    Other and unspecified hyperlipidemia    Unspecified essential hypertension     Past Surgical History:  Procedure Laterality Date   CATARACT EXTRACTION, BILATERAL     CORONARY ANGIOPLASTY WITH STENT PLACEMENT     HIP ARTHROPLASTY Right 07/04/2022   Procedure: RIGHT ANTERIOR HIP REPLACEMENT ;  Surgeon: Vernetta Lonni GRADE, MD;  Location: [facility];  Service: Orthopedics;  Laterality: Right;    Allergies  Allergen Reactions   Norvasc [Amlodipine]  Anaphylaxis, Swelling and Other (See Comments)    Edema   Altace [Ramipril] Cough   Fosamax [Alendronate] Other (See Comments)    Dysphagia    Outpatient Encounter Medications as of 10/14/2024  Medication Sig   acetaminophen  (TYLENOL ) 325 MG tablet Take 650 mg by mouth 3 (three) times daily.   aspirin  EC 81 MG tablet Take 81 mg by mouth daily. Swallow whole.   atorvastatin  (LIPITOR) 10 MG tablet Take 1 tablet (10 mg total) by mouth at bedtime for 5 days.   carbidopa -levodopa  (SINEMET  IR) 25-100 MG tablet 1.5 tablets at 7am/11am/4pm   furosemide (LASIX) 20 MG tablet Take 20 mg by mouth daily as needed for fluid.   mirtazapine  (REMERON ) 7.5 MG tablet Take 1 tablet (7.5 mg total) by mouth at bedtime.   nitroGLYCERIN  (NITROSTAT ) 0.4 MG SL tablet Place 1 tablet (0.4 mg total) under the tongue every 5 (five) minutes as needed for chest pain.   ondansetron  (ZOFRAN ) 4 MG tablet Take 4 mg by mouth every 6 (six) hours as needed for nausea or vomiting. (Patient not taking: Reported on 10/01/2024)   pantoprazole  (PROTONIX ) 40 MG tablet Take 1 tablet (40 mg total) by mouth 2 (two) times daily.   polyethylene glycol (MIRALAX  / GLYCOLAX ) 17 g packet Take 17 g by mouth every other day.   senna (SENOKOT) 8.6 MG tablet Take 1 tablet (8.6  mg total) by mouth at bedtime.   zinc  oxide 20 % ointment Apply 1 Application topically as needed for irritation.   No facility-administered encounter medications on file as of 10/14/2024.    Review of Systems:  Review of Systems  Constitutional:  Positive for activity change and unexpected weight change. Negative for appetite change.  HENT: Negative.    Respiratory:  Positive for shortness of breath. Negative for cough.   Cardiovascular:  Positive for leg swelling.  Gastrointestinal:  Negative for constipation.  Genitourinary: Negative.   Musculoskeletal:  Positive for gait problem. Negative for arthralgias and myalgias.  Skin: Negative.   Neurological:  Negative for  dizziness and weakness.  Psychiatric/Behavioral:  Positive for confusion. Negative for dysphoric mood and sleep disturbance.     Health Maintenance  Topic Date Due   Influenza Vaccine  07/09/2024   COVID-19 Vaccine (8 - 2025-26 season) 08/09/2024   Medicare Annual Wellness (AWV)  09/11/2024   DTaP/Tdap/Td (3 - Td or Tdap) 06/14/2026   Pneumococcal Vaccine: 50+ Years  Completed   DEXA SCAN  Completed   Zoster Vaccines- Shingrix  Completed   Meningococcal B Vaccine  Aged Out    Physical Exam: There were no vitals filed for this visit. There is no height or weight on file to calculate BMI. Physical Exam Vitals reviewed.  Constitutional:      Appearance: Normal appearance.  HENT:     Head: Normocephalic.     Nose: Nose normal.     Mouth/Throat:     Mouth: Mucous membranes are moist.     Pharynx: Oropharynx is clear.  Eyes:     Pupils: Pupils are equal, round, and reactive to light.  Cardiovascular:     Rate and Rhythm: Normal rate and regular rhythm.     Pulses: Normal pulses.     Heart sounds: Normal heart sounds. No murmur heard. Pulmonary:     Effort: Pulmonary effort is normal.     Comments: Decreased BS on both Lungs Abdominal:     General: Abdomen is flat. Bowel sounds are normal.     Palpations: Abdomen is soft.  Musculoskeletal:        General: No swelling.     Cervical back: Neck supple.     Comments: Mild Edema  Skin:    General: Skin is warm.  Neurological:     General: No focal deficit present.     Mental Status: She is alert.  Psychiatric:        Mood and Affect: Mood normal.        Thought Content: Thought content normal.     Labs reviewed: Basic Metabolic Panel: Recent Labs    02/19/24 0000 08/02/24 0000  NA 143 144  K 4.8 4.2  CL 109* 107  CO2 27* 28*  BUN 34* 37*  CREATININE 1.3* 1.3*  CALCIUM  8.8 8.8  TSH  --  0.78   Liver Function Tests: Recent Labs    08/02/24 0000  AST 14  ALT 4*  ALKPHOS 86  ALBUMIN 3.8   No results for  input(s): LIPASE, AMYLASE in the last 8760 hours. No results for input(s): AMMONIA in the last 8760 hours. CBC: Recent Labs    02/19/24 0000 08/02/24 0000  WBC 5.5 6.2  NEUTROABS  --  3,373.00  HGB 12.8 13.1  HCT 39 40  PLT 252 236   Lipid Panel: Recent Labs    08/02/24 0000  CHOL 143  HDL 52  LDLCALC 70  TRIG  125   No results found for: HGBA1C  Procedures since last visit: No results found.  Assessment/Plan 1. Acute congestive heart failure, unspecified heart failure type (HCC) (Primary) Will restart her on Lasix 20 mg 3/week Continue PRN Lasix if gains Weight  2. Stage 3b chronic kidney disease (HCC) Repeat BMP in 2 weeks  3. Parkinson's disease without dyskinesia or fluctuating manifestations (HCC) On Sinemet   4. Neurogenic orthostatic hypotension (HCC) Some BP here have been low Will check it BID and if still low consider Midodrine   Labs/tests ordered:  BMP in 2 weeks Next appt:  Visit date not found

## 2024-10-15 ENCOUNTER — Telehealth: Payer: Self-pay | Admitting: Cardiovascular Disease

## 2024-10-15 ENCOUNTER — Encounter: Payer: Self-pay | Admitting: Nurse Practitioner

## 2024-10-15 ENCOUNTER — Other Ambulatory Visit: Payer: Self-pay | Admitting: Nurse Practitioner

## 2024-10-15 DIAGNOSIS — D509 Iron deficiency anemia, unspecified: Secondary | ICD-10-CM | POA: Insufficient documentation

## 2024-10-15 MED ORDER — FERROUS SULFATE 325 (65 FE) MG PO TABS: 325.0000 mg | ORAL_TABLET | ORAL | 3 refills | Status: AC

## 2024-10-15 MED ORDER — VITAMIN B-12 1000 MCG PO TABS: 1000.0000 ug | ORAL_TABLET | Freq: Every day | ORAL | 1 refills | Status: AC

## 2024-10-15 NOTE — Telephone Encounter (Signed)
Left message for pt daughter to call  ?

## 2024-10-15 NOTE — Telephone Encounter (Signed)
 Pts daughter Ronal is bringing pt to appt on 12/11, and would like to know if they could come any time later than 8:20. Pt lives in a facility in [place], and daughter lives in [place]. Please advise.

## 2024-10-18 ENCOUNTER — Non-Acute Institutional Stay: Payer: Self-pay | Admitting: Nurse Practitioner

## 2024-10-18 ENCOUNTER — Encounter: Payer: Self-pay | Admitting: Nurse Practitioner

## 2024-10-18 DIAGNOSIS — K219 Gastro-esophageal reflux disease without esophagitis: Secondary | ICD-10-CM | POA: Diagnosis not present

## 2024-10-18 DIAGNOSIS — I509 Heart failure, unspecified: Secondary | ICD-10-CM | POA: Insufficient documentation

## 2024-10-18 DIAGNOSIS — N1832 Chronic kidney disease, stage 3b: Secondary | ICD-10-CM | POA: Diagnosis not present

## 2024-10-18 DIAGNOSIS — E782 Mixed hyperlipidemia: Secondary | ICD-10-CM

## 2024-10-18 DIAGNOSIS — D509 Iron deficiency anemia, unspecified: Secondary | ICD-10-CM

## 2024-10-18 DIAGNOSIS — F5101 Primary insomnia: Secondary | ICD-10-CM

## 2024-10-18 DIAGNOSIS — I480 Paroxysmal atrial fibrillation: Secondary | ICD-10-CM | POA: Diagnosis not present

## 2024-10-18 DIAGNOSIS — G20A1 Parkinson's disease without dyskinesia, without mention of fluctuations: Secondary | ICD-10-CM

## 2024-10-18 DIAGNOSIS — G4700 Insomnia, unspecified: Secondary | ICD-10-CM | POA: Insufficient documentation

## 2024-10-18 NOTE — Assessment & Plan Note (Signed)
 taking Sinemet ,

## 2024-10-18 NOTE — Assessment & Plan Note (Signed)
 Stable, continue mirtazapine 

## 2024-10-18 NOTE — Assessment & Plan Note (Signed)
 Bun/creat 30/1.3 10/11/24

## 2024-10-18 NOTE — Assessment & Plan Note (Signed)
 Heart rate is in control, Hx of right leg DVT and PE after right hip hemiarthroplasty

## 2024-10-18 NOTE — Assessment & Plan Note (Signed)
 on atorvastatin

## 2024-10-18 NOTE — Assessment & Plan Note (Signed)
 10/11/24 Hgb 10.5 dropped from previous 12s 10/14/24 Iron 34, Vit B12 287, folate 6.4 10/15/24 starting Vit B12 1000mcg every day, Fe 325 MWF Denied GI symptoms or s/s of bleeding.  CBC 2 weeks.

## 2024-10-18 NOTE — Assessment & Plan Note (Signed)
 Stable,  taking pantoprazole 

## 2024-10-18 NOTE — Progress Notes (Addendum)
 Location:   [facility] Room Number: 25 Place of Service:  AL[facility] (13) Provider: Larwance Adabella Stanis NP  Charlanne Fredia CROME, MD  Patient Care Team: Charlanne Fredia CROME, MD as PCP - General (Internal Medicine) Wonda Sharper, MD as PCP - Cardiology (Cardiology) Signa Rush, MD (Inactive) (Internal Medicine) Tat, Asberry RAMAN, DO as Consulting Physician (Neurology)  Extended Emergency Contact Information Primary Emergency Contact: [facility] Phone: [phone] Mobile Phone: [phone] Relation: Daughter  Code Status:  DNR Goals of care: Advanced Directive information    10/01/2024    1:40 PM  Advanced Directives  Does Patient Have a Medical Advance Directive? Yes  Type of Advance Directive Out of facility DNR (pink MOST or yellow form)  Does patient want to make changes to medical advance directive? No - Patient declined  Pre-existing out of facility DNR order (yellow form or pink MOST form) Yellow form placed in chart (order not valid for inpatient use);Pink MOST form placed in chart (order not valid for inpatient use)     Chief Complaint  Patient presents with   Acute Visit    Anemia    HPI:  Pt is a 89 y.o. female seen today for an acute visit for anemia  10/11/24 Hgb 10.5 dropped from previous 12s 10/14/24 Iron 34, Vit B12 287, folate 6.4 10/15/24 starting Vit B12 1000mcg every day, Fe 325 MWF Denied GI symptoms or s/s of bleeding.   Parkinson's, taking Sinemet   CHF, off oxygen, improved shortness of breath, on  furosemide 20 mg 3 times a week and a daily as needed, BNP 606, chest x-ray showed interstitial edema bilateral pleural effusion  PAT, Hx of right leg DVT and PE after right hip hemiarthroplasty, off Eliquis  02/01/23  GERD, taking pantoprazole   HLD, on atorvastatin   Cognitive impairment, [facility] for care.   Neurogenic orthostatic hypotension, chronic  CKD, Bun/creat 30/1.3 10/11/24  Insomnia, taking Mirtazapine .      Past Medical History:  Diagnosis Date    Coronary artery disease    Methicillin resistant Staphylococcus aureus in conditions classified elsewhere and of unspecified site    MI (myocardial infarction) (HCC)    Other and unspecified hyperlipidemia    Unspecified essential hypertension    Past Surgical History:  Procedure Laterality Date   CATARACT EXTRACTION, BILATERAL     CORONARY ANGIOPLASTY WITH STENT PLACEMENT     HIP ARTHROPLASTY Right 07/04/2022   Procedure: RIGHT ANTERIOR HIP REPLACEMENT ;  Surgeon: Vernetta Lonni GRADE, MD;  Location: [facility];  Service: Orthopedics;  Laterality: Right;    Allergies  Allergen Reactions   Norvasc [Amlodipine] Anaphylaxis, Swelling and Other (See Comments)    Edema   Altace [Ramipril] Cough   Fosamax [Alendronate] Other (See Comments)    Dysphagia    Allergies as of 10/18/2024       Reactions   Norvasc [amlodipine] Anaphylaxis, Swelling, Other (See Comments)   Edema   Altace [ramipril] Cough   Fosamax [alendronate] Other (See Comments)   Dysphagia        Medication List        Accurate as of October 18, 2024  4:36 PM. If you have any questions, ask your nurse or doctor.          acetaminophen  325 MG tablet Commonly known as: TYLENOL  Take 650 mg by mouth 3 (three) times daily.   aspirin  EC 81 MG tablet Take 81 mg by mouth daily. Swallow whole.   atorvastatin  10 MG tablet Commonly known as: LIPITOR  Take 1 tablet (10 mg total) by mouth at bedtime for 5 days.   carbidopa -levodopa  25-100 MG tablet Commonly known as: SINEMET  IR 1.5 tablets at 7am/11am/4pm   cyanocobalamin 1000 MCG tablet Commonly known as: VITAMIN B12 Take 1 tablet (1,000 mcg total) by mouth daily.   ferrous sulfate  325 (65 FE) MG tablet Take 1 tablet (325 mg total) by mouth 3 (three) times a week.   furosemide 20 MG tablet Commonly known as: LASIX Take 20 mg by mouth daily as needed for fluid.   furosemide 20 MG tablet Commonly known as: Lasix Take 1 tablet (20 mg total) by mouth  3 (three) times a week.   mirtazapine  7.5 MG tablet Commonly known as: REMERON  Take 1 tablet (7.5 mg total) by mouth at bedtime.   nitroGLYCERIN  0.4 MG SL tablet Commonly known as: NITROSTAT  Place 1 tablet (0.4 mg total) under the tongue every 5 (five) minutes as needed for chest pain.   pantoprazole  40 MG tablet Commonly known as: Protonix  Take 1 tablet (40 mg total) by mouth 2 (two) times daily.   polyethylene glycol 17 g packet Commonly known as: MIRALAX  / GLYCOLAX  Take 17 g by mouth every other day.   senna 8.6 MG tablet Commonly known as: SENOKOT Take 1 tablet (8.6 mg total) by mouth at bedtime.   zinc  oxide 20 % ointment Apply 1 Application topically as needed for irritation.        Review of Systems  Constitutional:  Negative for appetite change, fatigue and fever.  HENT:  Positive for hearing loss. Negative for congestion.   Eyes:  Negative for visual disturbance.  Respiratory:  Negative for cough, chest tightness, shortness of breath and wheezing.   Cardiovascular:  Negative for leg swelling.  Gastrointestinal:  Negative for abdominal pain and constipation.  Genitourinary:  Negative for dysuria, frequency and urgency.  Musculoskeletal:  Positive for arthralgias and gait problem.  Skin:  Negative for color change.  Neurological:  Negative for tremors, speech difficulty, weakness and headaches.  Psychiatric/Behavioral:  Negative for behavioral problems, dysphoric mood and sleep disturbance. The patient is not nervous/anxious.     Immunization History  Administered Date(s) Administered   Fluad Quad(high Dose 65+) 10/02/2022   Fluad Trivalent(High Dose 65+) 10/07/2023   Fluzone Influenza virus vaccine,trivalent (IIV3), split virus 09/08/2013, 09/09/2015, 10/21/2016, 09/22/2019, 09/13/2021   Influenza Split 09/24/2010, 09/04/2011, 09/18/2012   Influenza-Unspecified 11/09/2014, 09/30/2015, 09/17/2016, 09/01/2017, 09/10/2018   Moderna Covid-19 Vaccine Bivalent  Booster 68yrs & up 05/22/2022   Moderna Sars-Covid-2 Vaccination 12/13/2019, 01/09/2021, 05/16/2021   PFIZER(Purple Top)SARS-COV-2 Vaccination 01/10/2020, 10/15/2022   Pfizer Covid-19 Vaccine Bivalent Booster 43yrs & up 08/30/2021   Pneumococcal Conjugate-13 04/18/2014   Pneumococcal Polysaccharide-23 09/20/2002, 09/23/2019   Td 10/11/2004   Tdap 06/14/2016   Zoster Recombinant(Shingrix) 02/10/2019, 05/04/2019   Zoster, Live 01/23/2009   Pertinent  Health Maintenance Due  Topic Date Due   Influenza Vaccine  07/09/2024   DEXA SCAN  Completed      09/12/2023   10:27 AM 10/08/2023   11:34 AM 07/27/2024   10:57 AM 09/01/2024    2:34 PM 09/22/2024    3:49 PM  Fall Risk  Falls in the past year? 0 0 1 1 1   Was there an injury with Fall? 0 0 0 1 1  Fall Risk Category Calculator 0 0 2 3 2   Patient at Risk for Falls Due to History of fall(s);Impaired balance/gait;Impaired mobility History of fall(s);Impaired balance/gait;Impaired mobility  History of fall(s);Impaired balance/gait;Impaired mobility History of  fall(s);Impaired balance/gait;Impaired mobility  Fall risk Follow up Falls evaluation completed;Education provided;Falls prevention discussed Falls evaluation completed;Education provided;Falls prevention discussed Falls evaluation completed Falls evaluation completed Falls evaluation completed;Education provided   Functional Status Survey:    Vitals:   10/18/24 1550  BP: 109/66  Pulse: (!) 52  Resp: 18  Temp: (!) 97.5 F (36.4 C)  SpO2: 96%  Weight: 100 lb 3.2 oz (45.5 kg)   Body mass index is 16.17 kg/m. Physical Exam  Labs reviewed: Recent Labs    02/19/24 0000 08/02/24 0000  NA 143 144  K 4.8 4.2  CL 109* 107  CO2 27* 28*  BUN 34* 37*  CREATININE 1.3* 1.3*  CALCIUM  8.8 8.8   Recent Labs    08/02/24 0000  AST 14  ALT 4*  ALKPHOS 86  ALBUMIN 3.8   Recent Labs    02/19/24 0000 08/02/24 0000  WBC 5.5 6.2  NEUTROABS  --  3,373.00  HGB 12.8 13.1  HCT 39  40  PLT 252 236   Lab Results  Component Value Date   TSH 0.78 08/02/2024   No results found for: HGBA1C Lab Results  Component Value Date   CHOL 143 08/02/2024   HDL 52 08/02/2024   LDLCALC 70 08/02/2024   TRIG 125 08/02/2024   CHOLHDL 3 09/22/2009    Significant Diagnostic Results in last 30 days:  No results found.  Assessment/Plan IDA (iron deficiency anemia) 10/11/24 Hgb 10.5 dropped from previous 12s 10/14/24 Iron 34, Vit B12 287, folate 6.4 10/15/24 starting Vit B12 1000mcg every day, Fe 325 MWF Denied GI symptoms or s/s of bleeding.  CBC 2 weeks.   A-fib (HCC) Heart rate is in control, Hx of right leg DVT and PE after right hip hemiarthroplasty  GERD (gastroesophageal reflux disease) Stable,  taking pantoprazole   Hyperlipidemia  on atorvastatin   CKD (chronic kidney disease), stage III (HCC) Bun/creat 30/1.3 10/11/24  Insomnia Stable, continue mirtazapine   CHF (congestive heart failure) (HCC) off oxygen, improved shortness of breath, on  furosemide 20 mg 3 times a week and a daily as needed, BNP 606, chest x-ray showed interstitial edema bilateral pleural effusion  Parkinson's disease (HCC)  taking Sinemet ,    Family/ staff Communication: Plan of care reviewed with the patient and charge nurse  Labs/tests ordered: CBC/differential in 2 weeks

## 2024-10-18 NOTE — Assessment & Plan Note (Signed)
 off oxygen, improved shortness of breath, on  furosemide 20 mg 3 times a week and a daily as needed, BNP 606, chest x-ray showed interstitial edema bilateral pleural effusion

## 2024-10-19 NOTE — Telephone Encounter (Signed)
 Attempted to call pt's daughter, unable to reach. LMTCB.

## 2024-10-22 ENCOUNTER — Telehealth: Payer: Self-pay | Admitting: Cardiovascular Disease

## 2024-10-22 NOTE — Telephone Encounter (Signed)
 I rhythm calling to report abnormal results call transferred

## 2024-10-22 NOTE — Telephone Encounter (Signed)
 I received a call from ir[employer] with 2 episodes from this patient's heart monitor. First episode was of SVT, 188 bpm, lasted for 60 seconds on 10/12/24 at 11:01 am on page 8 of the report sent in the chart. The second episode was for a complete heart block, 21 bpm, lasted for 20 seconds on 10/15/24 at 7:33 am and shown on page 11 of the report. I called the patient and was not able to speak with her. I left a message and then called her daughter who is her emergency contact. I was able to speak to her on the phone and she stated her mother is doing okay but for the past few days she was been fatigued and needed some oxygen. She is no longer needing oxygen and is 94% on room air. But the daughter is concerned and wants to see if we can move her appointment up. I do not see any availability and they only want to see the MD. I gave ED precautions and will forward this to Dr. Wonda and his nurse.

## 2024-10-25 NOTE — Telephone Encounter (Signed)
 Reviewed monitor. The episodes of complete heart block were all around 7:30am and I suspect she was sleeping when this occurred. As long as she isn't having episodes of passing out or spells of extreme dizziness, she could wait until her appt with me. Otherwise, would get her in for an APP or DOD visit if they are concerned she needs to be seen sooner. thx

## 2024-10-25 NOTE — Telephone Encounter (Signed)
 I called the patient's daughter and she was able to speak with me. I let her know of Dr. Margurite response to the monitor results and moving forward with her care what he would like to do. She understood and stated that her mother has been doing well and has not been experiencing any symptoms. We plan to see her in December 2025.   Per Dr. Wonda, Reviewed monitor. The episodes of complete heart block were all around 7:30am and I suspect she was sleeping when this occurred. As long as she isn't having episodes of passing out or spells of extreme dizziness, she could wait until her appt with me. Otherwise, would get her in for an APP or DOD visit if they are concerned she needs to be seen sooner. Thx

## 2024-10-27 NOTE — Telephone Encounter (Signed)
 Spoke with pt's daughter Ronal and moved pt's appt to 12/11 at 11 AM.

## 2024-10-31 ENCOUNTER — Ambulatory Visit: Payer: Self-pay | Admitting: Cardiovascular Disease

## 2024-10-31 DIAGNOSIS — I4719 Other supraventricular tachycardia: Secondary | ICD-10-CM | POA: Diagnosis not present

## 2024-11-18 ENCOUNTER — Ambulatory Visit: Admitting: Cardiovascular Disease

## 2024-11-18 ENCOUNTER — Ambulatory Visit: Attending: Cardiovascular Disease | Admitting: Cardiovascular Disease

## 2024-11-18 ENCOUNTER — Encounter: Payer: Self-pay | Admitting: Cardiovascular Disease

## 2024-11-18 VITALS — BP 110/60 | HR 69 | Ht 67.0 in | Wt 101.6 lb

## 2024-11-18 DIAGNOSIS — I4719 Other supraventricular tachycardia: Secondary | ICD-10-CM | POA: Diagnosis not present

## 2024-11-18 DIAGNOSIS — I2510 Atherosclerotic heart disease of native coronary artery without angina pectoris: Secondary | ICD-10-CM

## 2024-11-18 NOTE — Patient Instructions (Signed)
 Medication Instructions:  Your physician recommends that you continue on your current medications as directed. Please refer to the Current Medication list given to you today.  *If you need a refill on your cardiac medications before your next appointment, please call your pharmacy*  Lab Work: None.  If you have labs (blood work) drawn today and your tests are completely normal, you will receive your results only by: MyChart Message (if you have MyChart) OR A paper copy in the mail If you have any lab test that is abnormal or we need to change your treatment, we will call you to review the results.  Testing/Procedures: None.  Follow-Up: At [facility], you and your health needs are our priority.  As part of our continuing mission to provide you with exceptional heart care, our providers are all part of one team.  This team includes your primary Cardiologist (physician) and Advanced Practice Providers or APPs (Physician Assistants and Nurse Practitioners) who all work together to provide you with the care you need, when you need it.  Your next appointment will be as needed and it will be with:     Provider:   Ozell Fell, MD

## 2024-11-18 NOTE — Progress Notes (Signed)
 Cardiology Office Note:    Date:  11/18/2024   ID:  Taylor Mendoza, DOB May 10, 1930, MRN [id number]  PCP:  Charlanne Fredia CROME, MD   [facility] Providers Cardiologist:  Ozell Fell, MD     Referring MD: Charlanne Fredia CROME, MD   Chief Complaint  Patient presents with   Follow-up    Palpitations/heart failure    History of Present Illness:    Taylor Mendoza is a 89 y.o. female with a hx of CAD and remote MI in 2010, presenting for follow-up evaluation. She has been having episodes of SVT in more recent years. She also has a history of DVT/PE. Here with her daughter today.  [facility].  Despite her advanced age and weight loss, she remains relatively independent, ambulating with a walker.  She expresses today that she really does not want any kind of medical interventions at this point nor does she wish to have office follow-up unless absolutely necessary.  The patient has had some episodes of SVT on a heart monitor and we discussed this at length today.  She occasionally has episodes of panting but the patient denies feeling short of breath.  She has no chest pain and has had no syncopal episodes.  She does have lightheadedness at times.  No edema, orthopnea, or PND.  Current Medications: Active Medications[1]   Allergies:   Norvasc [amlodipine], Altace [ramipril], and Fosamax [alendronate]   ROS:   Please see the history of present illness.    All other systems reviewed and are negative.  EKGs/Labs/Other Studies Reviewed:    The following studies were reviewed today: Cardiac Studies & Procedures   ______________________________________________________________________________________________     ECHOCARDIOGRAM  ECHOCARDIOGRAM COMPLETE 07/04/2022  Narrative ECHOCARDIOGRAM REPORT    Patient Name:   Taylor Mendoza Date of Exam: 07/04/2022 Medical Rec #:  [id number]       Height:       66.0 in Accession #:    [id number]      Weight:        119.7 lb Date of Birth:  24-Aug-1930       BSA:          1.608 m Patient Age:    89 years        BP:           126/77 mmHg Patient Gender: F               HR:           46 bpm. Exam Location:  Inpatient  Procedure: 2D Echo, Cardiac Doppler, Color Doppler and Intracardiac Opacification Agent  STAT ECHO  Indications:     Abnormal ECG R94.31  History:         Patient has prior history of Echocardiogram examinations, most recent 10/04/2016. CAD and Previous Myocardial Infarction; Arrythmias:Bradycardia. Fractured hip.  Sonographer:     Annabella Fell RDCS Referring Phys:  [phone] STEPHEN K CHIU Diagnosing Phys: Ezra McleanMD  IMPRESSIONS   1. Left ventricular ejection fraction, by estimation, is 50%. The left ventricle has mildly decreased function. The left ventricle demonstrates global hypokinesis. There is mild concentric left ventricular hypertrophy. Left ventricular diastolic parameters are consistent with Grade I diastolic dysfunction (impaired relaxation). 2. Right ventricular systolic function is normal. The right ventricular size is normal. There is moderately elevated pulmonary artery systolic pressure. The estimated right ventricular systolic pressure is 50.8 mmHg. 3. Left atrial size was moderately dilated.  4. Right atrial size was mildly dilated. 5. The mitral valve is normal in structure. Mild mitral valve regurgitation. No evidence of mitral stenosis. 6. The aortic valve is tricuspid. There is mild calcification of the aortic valve. Aortic valve regurgitation is moderate. 7. Aortic dilatation noted. There is severe dilatation of the ascending aorta, measuring 52 mm. 8. The inferior vena cava is dilated in size with <50% respiratory variability, suggesting right atrial pressure of 15 mmHg. 9. A small pericardial effusion is present.  FINDINGS Left Ventricle: Left ventricular ejection fraction, by estimation, is 50%. The left ventricle has mildly decreased function.  The left ventricle demonstrates global hypokinesis. Definity  contrast agent was given IV to delineate the left ventricular endocardial borders. The left ventricular internal cavity size was normal in size. There is mild concentric left ventricular hypertrophy. Left ventricular diastolic parameters are consistent with Grade I diastolic dysfunction (impaired relaxation).  Right Ventricle: The right ventricular size is normal. No increase in right ventricular wall thickness. Right ventricular systolic function is normal. There is moderately elevated pulmonary artery systolic pressure. The tricuspid regurgitant velocity is 2.99 m/s, and with an assumed right atrial pressure of 15 mmHg, the estimated right ventricular systolic pressure is 50.8 mmHg.  Left Atrium: Left atrial size was moderately dilated.  Right Atrium: Right atrial size was mildly dilated.  Pericardium: A small pericardial effusion is present.  Mitral Valve: The mitral valve is normal in structure. Mild mitral valve regurgitation. No evidence of mitral valve stenosis.  Tricuspid Valve: The tricuspid valve is normal in structure. Tricuspid valve regurgitation is mild.  Aortic Valve: The aortic valve is tricuspid. There is mild calcification of the aortic valve. Aortic valve regurgitation is moderate. Aortic regurgitation PHT measures 842 msec.  Pulmonic Valve: The pulmonic valve was normal in structure. Pulmonic valve regurgitation is trivial.  Aorta: Aortic dilatation noted. There is severe dilatation of the ascending aorta, measuring 52 mm.  Venous: The inferior vena cava is dilated in size with less than 50% respiratory variability, suggesting right atrial pressure of 15 mmHg.  IAS/Shunts: No atrial level shunt detected by color flow Doppler.   LEFT VENTRICLE PLAX 2D LVIDd:         5.60 cm     Diastology LVIDs:         3.20 cm     LV e' medial:    3.23 cm/s LV PW:         1.00 cm     LV E/e' medial:  13.0 LV IVS:         0.90 cm     LV e' lateral:   3.38 cm/s LVOT diam:     2.00 cm     LV E/e' lateral: 12.4 LVOT Area:     3.14 cm  LV Volumes (MOD) LV vol d, MOD A2C: 85.5 ml LV vol d, MOD A4C: 91.5 ml LV vol s, MOD A2C: 48.3 ml LV vol s, MOD A4C: 48.2 ml LV SV MOD A2C:     37.2 ml LV SV MOD A4C:     91.5 ml LV SV MOD BP:      41.3 ml  RIGHT VENTRICLE RV Basal diam:  4.70 cm RV Mid diam:    2.40 cm RV S prime:     15.20 cm/s TAPSE (M-mode): 2.0 cm  LEFT ATRIUM             Index        RIGHT ATRIUM  Index LA diam:        3.20 cm 1.99 cm/m   RA Area:     18.80 cm LA Vol (A2C):   90.4 ml 56.22 ml/m  RA Volume:   48.00 ml  29.85 ml/m LA Vol (A4C):   39.3 ml 24.44 ml/m LA Biplane Vol: 59.8 ml 37.19 ml/m AORTIC VALVE AI PHT:      842 msec  AORTA Ao Root diam: 3.10 cm Ao STJ diam:  3.1 cm Ao Asc diam:  4.90 cm  MITRAL VALVE               TRICUSPID VALVE MV Area (PHT): 1.94 cm    TR Peak grad:   35.8 mmHg MV Decel Time: 391 msec    TR Vmax:        299.00 cm/s MV E velocity: 42.00 cm/s MV A velocity: 84.80 cm/s  SHUNTS MV E/A ratio:  0.50        Systemic Diam: 2.00 cm  Dalton McleanMD Electronically signed by Ezra Kanner Signature Date/Time: 07/04/2022/10:12:09 AM    Final (Updated)    MONITORS  LONG TERM MONITOR (3-14 DAYS) 10/26/2024  Narrative Patch Wear Time:  6 days and 23 hours (2025-11-02T13:47:07-499 to 2025-11-09T13:08:04-499)  Patient had a min HR of 21 bpm, max HR of 207 bpm, and avg HR of 66 bpm. Predominant underlying rhythm was Sinus Rhythm. First Degree AV Block was present. Intermittent Bundle Branch Block was present. 3347 Supraventricular Tachycardia runs occurred, the run with the fastest interval lasting 46.3 secs with a max rate of 207 bpm, the longest lasting 22 mins 1 sec with an avg rate of 142 bpm. 9 episode(s) of AV Block (2nd, High Grade and 3rd) occurred, lasting a total of 2 mins 1 sec. Isolated SVEs were frequent (15.4%, [id number]), SVE  Couplets were frequent (6.7%, 23744), and SVE Triplets were occasional (5.0%, 11650). Isolated VEs were rare (<1.0%), VE Couplets were rare (<1.0%), and no VE Triplets were present. MD notification criteria for Complete Heart Block and Supraventricular Tachycardia met - report posted prior to notification ([place]).  Summary: the basic rhythm is normal sinus with an average HR of 66 bpm. Frequent runs of SVT noted as detailed above. Periods of 3rd degree AV block in the early morning hours, possibly during sleep.       ______________________________________________________________________________________________      EKG:   EKG Interpretation Date/Time:  Thursday November 18 2024 11:37:47 EST Ventricular Rate:  69 PR Interval:    QRS Duration:  74 QT Interval:  432 QTC Calculation: 462 R Axis:   103  Text Interpretation: Unusual P axis, possible ectopic atrial rhythm with Premature atrial complexes Rightward axis Biventricular hypertrophy When compared with ECG of 23-Jul-2022 10:51, PREVIOUS ECG IS PRESENTQRS axis has shifted rightward and PAC's are frequent Confirmed by Wonda Sharper [phone]) on 11/18/2024 11:40:33 AM    Recent Labs: 08/02/2024: ALT 4; BUN 37; Creatinine 1.3; Hemoglobin 13.1; Platelets 236; Potassium 4.2; Sodium 144; TSH 0.78  Recent Lipid Panel    Component Value Date/Time   CHOL 143 08/02/2024 0000   TRIG 125 08/02/2024 0000   HDL 52 08/02/2024 0000   CHOLHDL 3 09/22/2009 1016   VLDL 19.4 09/22/2009 1016   LDLCALC 70 08/02/2024 0000     Risk Assessment/Calculations:                Physical Exam:    VS:  BP 110/60 (BP Location: Right Arm, Patient Position: Sitting, Cuff Size: Small)  Pulse 69   Ht 5' 7 (1.702 m)   Wt 101 lb 9.6 oz (46.1 kg)   SpO2 99%   BMI 15.91 kg/m     Wt Readings from Last 3 Encounters:  11/18/24 101 lb 9.6 oz (46.1 kg)  10/18/24 100 lb 3.2 oz (45.5 kg)  10/14/24 103 lb (46.7 kg)     GEN:  Pleasant elderly woman, very  thin, in no acute distress HEENT: Normal NECK: No JVD; No carotid bruits LYMPHATICS: No lymphadenopathy CARDIAC: Irregular, no murmurs, rubs, gallops RESPIRATORY:  Clear to auscultation without rales, wheezing or rhonchi  ABDOMEN: Soft, non-tender, non-distended MUSCULOSKELETAL:  No edema; No deformity  SKIN: Warm and dry NEUROLOGIC:  Alert and oriented x 3 PSYCHIATRIC:  Normal affect   Assessment & Plan PAT (paroxysmal atrial tachycardia) Normally I would place the patient on a low-dose of beta-blocker.  However, her monitor shows nocturnal complete heart block.  I would be concerned that we could cause progressive heart block precipitating symptomatic bradycardia if we started her on any kind of AV nodal blocking agents.  We had a lengthy discussion about her limited treatment options for her today.  She does not wish to do anything aggressive at all.  She would never want to consider a pacemaker.  She is reassured that nocturnal heart block is common and generally not treated.  Continue observation.  Her EKG today shows an ectopic atrial rhythm with normal heart rate of 69 bpm. Coronary artery disease involving native coronary artery of native heart without angina pectoris Patient clinically stable with no anginal symptoms.  Continue aspirin  and atorvastatin .  At the patient's request, I will not schedule any follow-up.  I be happy to see her at any time if problems arise. It's been a pleasure taking care of her for the past 15 years.            Medication Adjustments/Labs and Tests Ordered: Current medicines are reviewed at length with the patient today.  Concerns regarding medicines are outlined above.  Orders Placed This Encounter  Procedures   EKG 12-Lead   No orders of the defined types were placed in this encounter.   Patient Instructions  Medication Instructions:  Your physician recommends that you continue on your current medications as directed. Please refer to the  Current Medication list given to you today.  *If you need a refill on your cardiac medications before your next appointment, please call your pharmacy*  Lab Work: None.  If you have labs (blood work) drawn today and your tests are completely normal, you will receive your results only by: MyChart Message (if you have MyChart) OR A paper copy in the mail If you have any lab test that is abnormal or we need to change your treatment, we will call you to review the results.  Testing/Procedures: None.  Follow-Up: At [facility], you and your health needs are our priority.  As part of our continuing mission to provide you with exceptional heart care, our providers are all part of one team.  This team includes your primary Cardiologist (physician) and Advanced Practice Providers or APPs (Physician Assistants and Nurse Practitioners) who all work together to provide you with the care you need, when you need it.  Your next appointment will be as needed and it will be with:     Provider:   Ozell Fell, MD              Signed, Ozell Fell, MD  11/18/2024  4:37 PM    [facility]     [1]  Current Meds  Medication Sig   acetaminophen  (TYLENOL ) 325 MG tablet Take 650 mg by mouth 3 (three) times daily.   aspirin  EC 81 MG tablet Take 81 mg by mouth daily. Swallow whole.   atorvastatin  (LIPITOR) 10 MG tablet Take 1 tablet (10 mg total) by mouth at bedtime for 5 days.   carbidopa -levodopa  (SINEMET  IR) 25-100 MG tablet 1.5 tablets at 7am/11am/4pm   cyanocobalamin (VITAMIN B12) 1000 MCG tablet Take 1 tablet (1,000 mcg total) by mouth daily.   ferrous sulfate  325 (65 FE) MG tablet Take 1 tablet (325 mg total) by mouth 3 (three) times a week.   furosemide  (LASIX ) 20 MG tablet Take 20 mg by mouth daily as needed for fluid.   furosemide  (LASIX ) 20 MG tablet Take 1 tablet (20 mg total) by mouth 3 (three) times a week.   mirtazapine  (REMERON ) 7.5 MG tablet Take 1 tablet  (7.5 mg total) by mouth at bedtime.   nitroGLYCERIN  (NITROSTAT ) 0.4 MG SL tablet Place 1 tablet (0.4 mg total) under the tongue every 5 (five) minutes as needed for chest pain.   pantoprazole  (PROTONIX ) 40 MG tablet Take 1 tablet (40 mg total) by mouth 2 (two) times daily.   polyethylene glycol (MIRALAX  / GLYCOLAX ) 17 g packet Take 17 g by mouth every other day.   senna (SENOKOT) 8.6 MG tablet Take 1 tablet (8.6 mg total) by mouth at bedtime.   zinc  oxide 20 % ointment Apply 1 Application topically as needed for irritation.

## 2025-01-13 ENCOUNTER — Non-Acute Institutional Stay: Payer: Self-pay | Admitting: Internal Medicine

## 2025-01-13 ENCOUNTER — Encounter: Payer: Self-pay | Admitting: Internal Medicine

## 2025-01-13 DIAGNOSIS — I4719 Other supraventricular tachycardia: Secondary | ICD-10-CM

## 2025-01-13 DIAGNOSIS — N1832 Chronic kidney disease, stage 3b: Secondary | ICD-10-CM

## 2025-01-13 DIAGNOSIS — D631 Anemia in chronic kidney disease: Secondary | ICD-10-CM

## 2025-01-13 DIAGNOSIS — F5101 Primary insomnia: Secondary | ICD-10-CM

## 2025-01-13 DIAGNOSIS — G20A1 Parkinson's disease without dyskinesia, without mention of fluctuations: Secondary | ICD-10-CM

## 2025-01-13 DIAGNOSIS — I509 Heart failure, unspecified: Secondary | ICD-10-CM

## 2025-01-13 DIAGNOSIS — E782 Mixed hyperlipidemia: Secondary | ICD-10-CM

## 2025-01-13 DIAGNOSIS — K219 Gastro-esophageal reflux disease without esophagitis: Secondary | ICD-10-CM

## 2025-01-13 NOTE — Progress Notes (Signed)
 "  Location:  [facility] of Service:  ALF ([id number])  Provider:   Code Status: DNR Goals of Care:     10/01/2024    1:40 PM  Advanced Directives  Does Patient Have a Medical Advance Directive? Yes  Type of Advance Directive Out of facility DNR (pink MOST or yellow form)  Does patient want to make changes to medical advance directive? No - Patient declined  Pre-existing out of facility DNR order (yellow form or pink MOST form) Yellow form placed in chart (order not valid for inpatient use);Pink MOST form placed in chart (order not valid for inpatient use)     Chief Complaint  Patient presents with   Care Management    HPI: Patient is a 89 y.o. female seen today for medical management of chronic diseases.    She lives in [place] in [facility] Patient Has h/o Right Leg DVT and PE after Right hip hemiarthroplasty   H/o PAT    GERD, HLD,HTN,  CKD ,stage 3 a and  Parkinson disease Cognitive impairment Anemia  Patient was recently seen by Dr. Wonda for [facility].  He was thinking of using beta-blocker but she also has nocturnal complete heart block.  Patient at that time told her that she does not want pacemaker.    Patient is continuing to decline Physically   Per nurses is needing more help with her ADLs.  They are talking to the family about skilled care level of care which patient does not want at this point.  Her appetite continues to be poor. Patient also complains of not sleeping well at night.  She  gets short of breath and tired very easily. Wt Readings from Last 3 Encounters:  01/13/25 101 lb 3.2 oz (45.9 kg)  11/18/24 101 lb 9.6 oz (46.1 kg)  10/18/24 100 lb 3.2 oz (45.5 kg)        Past Medical History:  Diagnosis Date   Coronary artery disease    Methicillin resistant Staphylococcus aureus in conditions classified elsewhere and of unspecified site    MI (myocardial infarction) (HCC)    Other and unspecified hyperlipidemia    Unspecified essential hypertension      Past Surgical History:  Procedure Laterality Date   CATARACT EXTRACTION, BILATERAL     CORONARY ANGIOPLASTY WITH STENT PLACEMENT     HIP ARTHROPLASTY Right 07/04/2022   Procedure: RIGHT ANTERIOR HIP REPLACEMENT ;  Surgeon: Vernetta Lonni GRADE, MD;  Location: [facility];  Service: Orthopedics;  Laterality: Right;    Allergies[1]  Outpatient Encounter Medications as of 01/13/2025  Medication Sig   acetaminophen  (TYLENOL ) 325 MG tablet Take 650 mg by mouth 3 (three) times daily.   aspirin  EC 81 MG tablet Take 81 mg by mouth daily. Swallow whole.   atorvastatin  (LIPITOR) 10 MG tablet Take 1 tablet (10 mg total) by mouth at bedtime for 5 days.   carbidopa -levodopa  (SINEMET  IR) 25-100 MG tablet 1.5 tablets at 7am/11am/4pm   cyanocobalamin (VITAMIN B12) 1000 MCG tablet Take 1 tablet (1,000 mcg total) by mouth daily.   ferrous sulfate  325 (65 FE) MG tablet Take 1 tablet (325 mg total) by mouth 3 (three) times a week.   furosemide  (LASIX ) 20 MG tablet Take 20 mg by mouth daily as needed for fluid.   furosemide  (LASIX ) 20 MG tablet Take 1 tablet (20 mg total) by mouth 3 (three) times a week.   mirtazapine  (REMERON ) 7.5 MG tablet Take 1 tablet (7.5 mg total) by mouth  at bedtime.   nitroGLYCERIN  (NITROSTAT ) 0.4 MG SL tablet Place 1 tablet (0.4 mg total) under the tongue every 5 (five) minutes as needed for chest pain.   pantoprazole  (PROTONIX ) 40 MG tablet Take 1 tablet (40 mg total) by mouth 2 (two) times daily.   polyethylene glycol (MIRALAX  / GLYCOLAX ) 17 g packet Take 17 g by mouth every other day.   senna (SENOKOT) 8.6 MG tablet Take 1 tablet (8.6 mg total) by mouth at bedtime.   zinc  oxide 20 % ointment Apply 1 Application topically as needed for irritation.   No facility-administered encounter medications on file as of 01/13/2025.    Review of Systems:  Review of Systems  Constitutional:  Positive for activity change and appetite change.  HENT: Negative.    Respiratory:  Positive for  shortness of breath. Negative for cough.   Cardiovascular:  Negative for leg swelling.  Gastrointestinal:  Negative for constipation.  Genitourinary: Negative.   Musculoskeletal:  Positive for gait problem. Negative for arthralgias and myalgias.  Skin: Negative.   Neurological:  Positive for weakness. Negative for dizziness.  Psychiatric/Behavioral:  Positive for confusion. Negative for dysphoric mood and sleep disturbance.     Health Maintenance  Topic Date Due   Influenza Vaccine  07/09/2024   COVID-19 Vaccine (8 - 2025-26 season) 08/09/2024   Medicare Annual Wellness (AWV)  09/11/2024   DTaP/Tdap/Td (3 - Td or Tdap) 06/14/2026   Pneumococcal Vaccine: 50+ Years  Completed   Bone Density Scan  Completed   Zoster Vaccines- Shingrix  Completed   Meningococcal B Vaccine  Aged Out    Physical Exam: Vitals:   01/13/25 1446  BP: 113/72  Pulse: 61  Resp: 18  Temp: (!) 97.1 F (36.2 C)  Weight: 101 lb 3.2 oz (45.9 kg)   Body mass index is 15.85 kg/m. Physical Exam Vitals reviewed.  Constitutional:      Appearance: Normal appearance.  HENT:     Head: Normocephalic.     Nose: Nose normal.     Mouth/Throat:     Mouth: Mucous membranes are moist.     Pharynx: Oropharynx is clear.  Eyes:     Pupils: Pupils are equal, round, and reactive to light.  Cardiovascular:     Rate and Rhythm: Normal rate and regular rhythm.     Pulses: Normal pulses.     Heart sounds: Normal heart sounds. No murmur heard. Pulmonary:     Effort: Pulmonary effort is normal.     Breath sounds: Rales present.  Abdominal:     General: Abdomen is flat. Bowel sounds are normal.     Palpations: Abdomen is soft.  Musculoskeletal:        General: No swelling.     Cervical back: Neck supple.  Skin:    General: Skin is warm.  Neurological:     General: No focal deficit present.     Mental Status: She is alert.  Psychiatric:        Mood and Affect: Mood normal.        Thought Content: Thought content  normal.     Labs reviewed: Basic Metabolic Panel: Recent Labs    02/19/24 0000 08/02/24 0000  NA 143 144  K 4.8 4.2  CL 109* 107  CO2 27* 28*  BUN 34* 37*  CREATININE 1.3* 1.3*  CALCIUM  8.8 8.8  TSH  --  0.78   Liver Function Tests: Recent Labs    08/02/24 0000  AST 14  ALT 4*  ALKPHOS 86  ALBUMIN 3.8   No results for input(s): LIPASE, AMYLASE in the last 8760 hours. No results for input(s): AMMONIA in the last 8760 hours. CBC: Recent Labs    02/19/24 0000 08/02/24 0000  WBC 5.5 6.2  NEUTROABS  --  3,373.00  HGB 12.8 13.1  HCT 39 40  PLT 252 236   Lipid Panel: Recent Labs    08/02/24 0000  CHOL 143  HDL 52  LDLCALC 70  TRIG 125   No results found for: HGBA1C  Procedures since last visit: No results found.  Assessment/Plan 1. PAT (paroxysmal atrial tachycardia) (Primary) ; Per Dr. Wonda no beta-blocker right now due to her heart block at night.  No aggressive workup in her  2. Gastroesophageal reflux disease, unspecified whether esophagitis present Continue on Protonix   3. Stage 3b chronic kidney disease (HCC) Creatinine seems to be stable  4. Primary insomnia I noticed that in point care patient is on Remeron  as needed and I will change it to every night  5. Chronic congestive heart failure, unspecified heart failure type (HCC) On Lasix  3 times a week and as needed seems to have been stable  6. Parkinson's disease without dyskinesia or fluctuating manifestations (HCC) On Sinemet  sees Dr Tat  7. Anemia due to stage 3b chronic kidney disease (HCC) Started on iron for low iron stores and B12  8. Mixed hyperlipidemia She is on statin LDL 70 in 8/25  Patient does have symptoms of failure to thrive Plan for her to eventually moved to skilled level of care  Labs/tests ordered:  * No order type specified * Next appt:  Visit date not found        [1]  Allergies Allergen Reactions   Norvasc [Amlodipine] Anaphylaxis, Swelling  and Other (See Comments)    Edema   Altace [Ramipril] Cough   Fosamax [Alendronate] Other (See Comments)    Dysphagia   "

## 2025-02-10 ENCOUNTER — Telehealth: Admitting: Neurology
